# Patient Record
Sex: Female | Born: 1990 | Race: White | Hispanic: No | Marital: Married | Smoking: Never smoker
Health system: Southern US, Community
[De-identification: ages and names within clinical notes are randomized; demographics above are authoritative.]

## PROBLEM LIST (undated history)

## (undated) ENCOUNTER — Inpatient Hospital Stay (HOSPITAL_COMMUNITY): Payer: Self-pay

## (undated) ENCOUNTER — Inpatient Hospital Stay: Payer: Self-pay

## (undated) DIAGNOSIS — C801 Malignant (primary) neoplasm, unspecified: Secondary | ICD-10-CM

## (undated) DIAGNOSIS — O219 Vomiting of pregnancy, unspecified: Secondary | ICD-10-CM

## (undated) DIAGNOSIS — G43909 Migraine, unspecified, not intractable, without status migrainosus: Secondary | ICD-10-CM

## (undated) DIAGNOSIS — Z6711 Type A blood, Rh negative: Secondary | ICD-10-CM

## (undated) DIAGNOSIS — N83209 Unspecified ovarian cyst, unspecified side: Secondary | ICD-10-CM

## (undated) HISTORY — DX: Migraine, unspecified, not intractable, without status migrainosus: G43.909

## (undated) HISTORY — DX: Vomiting of pregnancy, unspecified: O21.9

## (undated) HISTORY — DX: Type A blood, Rh negative: Z67.11

## (undated) HISTORY — PX: LAPAROSCOPIC OVARIAN CYSTECTOMY: SUR786

## (undated) NOTE — *Deleted (*Deleted)
IUD PLACEMENT POST-PROCEDURE INSTRUCTIONS  1. You may take Ibuprofen, Aleve or Tylenol for pain if needed.  Cramping should resolve within in 24 hours.  2. You may have a small amount of spotting.  You should wear a mini pad for the next few days.  3. You may have intercourse after 24 hours.  If you using this for birth control, it is effective immediately.  4. You need to call if you have any pelvic pain, fever, heavy bleeding or foul smelling vaginal discharge.  Irregular bleeding is common the first several months after having an IUD placed. You do not need to call for this reason unless you are concerned.  5. Shower or bathe as normal  6. You should have a follow-up appointment in 4-8 weeks for a re-check to make sure you are not having any problems. 

---
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
## 2010-12-15 ENCOUNTER — Emergency Department: Payer: Self-pay | Admitting: Emergency Medicine

## 2011-03-14 NOTE — Op Note (Signed)
NAMEMaple Ball, Hannah Ball               ACCOUNT NO.:  [id number]   MEDICAL RECORD NO.:  [id number]          PATIENT TYPE:  AMB   LOCATION:  SDS                          FACILITY:  [facility]   PHYSICIAN:  Jon Gills, M.D.  DATE OF BIRTH:  11-23-1990   DATE OF PROCEDURE:  01/24/2008  DATE OF DISCHARGE:                               OPERATIVE REPORT   PREOPERATIVE DIAGNOSIS:  Right upper quadrant abdominal pain.   POSTOPERATIVE DIAGNOSIS:  Right upper quadrant abdominal pain.   OPERATION:  Upper GI endoscopy with biopsy.   SURGEON:  Jon Gills, MD   ASSISTANTS:  None.   DESCRIPTION OF FINDINGS:  Following informed written consent, the  patient was taken to the operating room and placed under general  anesthesia with continuous cardiopulmonary monitoring.  She remained in  the supine position and the Pentax upper GI endoscope was passed by  mouth and advanced without difficulty.  A competent lower esophageal  sphincter was present 40 cm from the incisors.  There was no visual  evidence for esophagitis, gastritis, duodenitis, or peptic ulcer  disease.  A solitary gastric biopsy was negative for Helicobacter by CLO  testing.  Multiple esophageal, gastric, and duodenal biopsies were  histologically normal.  The endoscope was gradually withdrawn, and the  patient was awakened and taken to the recovery room in satisfactory  condition.  She will be released later today to the care of her family.   DESCRIPTION OF TECHNICAL PROCEDURES USED:  Pentax upper GI endoscope  with cold biopsy forceps.   DESCRIPTION OF SPECIMENS REMOVED:  Esophagus x3 in formalin, gastric x1  for CLO testing, gastric x3 in formalin, and duodenum x3 in formalin.           ______________________________  Jon Gills, M.D.     JHC/MEDQ  D:  02/17/2008  T:  02/18/2008  Job:  [id number]   cc:   Vonita Moss, MD

## 2011-07-24 LAB — CLOTEST (H. PYLORI), BIOPSY: Helicobacter screen: NEGATIVE

## 2011-07-24 LAB — CBC
Hemoglobin: 12.3
RDW: 15.1

## 2011-07-24 LAB — DIFFERENTIAL
Basophils Absolute: 0
Lymphocytes Relative: 33
Monocytes Absolute: 0.3
Neutro Abs: 2.9
Neutrophils Relative %: 58

## 2011-10-31 DIAGNOSIS — N83209 Unspecified ovarian cyst, unspecified side: Secondary | ICD-10-CM

## 2012-09-04 ENCOUNTER — Inpatient Hospital Stay: Payer: Self-pay

## 2012-09-04 LAB — CBC WITH DIFFERENTIAL/PLATELET
Basophil #: 0 10*3/uL (ref 0.0–0.1)
Eosinophil #: 0.1 10*3/uL (ref 0.0–0.7)
Eosinophil %: 0.6 %
HGB: 10.6 g/dL — ABNORMAL LOW (ref 12.0–16.0)
Lymphocyte %: 18.1 %
Monocyte %: 6.9 %
Neutrophil %: 74.1 %
RBC: 4.5 10*6/uL (ref 3.80–5.20)

## 2012-09-05 LAB — PLATELET COUNT: Platelet: 143 10*3/uL — ABNORMAL LOW (ref 150–440)

## 2014-03-04 ENCOUNTER — Emergency Department: Payer: Self-pay | Admitting: Emergency Medicine

## 2015-04-24 ENCOUNTER — Emergency Department
Admission: EM | Admit: 2015-04-24 | Discharge: 2015-04-25 | Disposition: A | Discharge status: Home / Self Care | Payer: Medicaid Other | Attending: Emergency Medicine | Admitting: Emergency Medicine

## 2015-04-24 ENCOUNTER — Encounter: Payer: Self-pay | Admitting: Emergency Medicine

## 2015-04-24 DIAGNOSIS — Z79899 Other long term (current) drug therapy: Secondary | ICD-10-CM | POA: Diagnosis not present

## 2015-04-24 DIAGNOSIS — Z880 Allergy status to penicillin: Secondary | ICD-10-CM | POA: Diagnosis not present

## 2015-04-24 DIAGNOSIS — R197 Diarrhea, unspecified: Secondary | ICD-10-CM | POA: Insufficient documentation

## 2015-04-24 DIAGNOSIS — R112 Nausea with vomiting, unspecified: Secondary | ICD-10-CM

## 2015-04-24 DIAGNOSIS — R109 Unspecified abdominal pain: Secondary | ICD-10-CM | POA: Diagnosis present

## 2015-04-24 DIAGNOSIS — Z91040 Latex allergy status: Secondary | ICD-10-CM | POA: Diagnosis not present

## 2015-04-24 DIAGNOSIS — R102 Pelvic and perineal pain: Secondary | ICD-10-CM

## 2015-04-24 HISTORY — DX: Unspecified ovarian cyst, unspecified side: N83.209

## 2015-04-24 LAB — COMPREHENSIVE METABOLIC PANEL
ALK PHOS: 56 U/L (ref 38–126)
ALT: 17 U/L (ref 14–54)
AST: 25 U/L (ref 15–41)
Albumin: 4.3 g/dL (ref 3.5–5.0)
Anion gap: 6 (ref 5–15)
BUN: 13 mg/dL (ref 6–20)
CHLORIDE: 105 mmol/L (ref 101–111)
CO2: 28 mmol/L (ref 22–32)
CREATININE: 0.87 mg/dL (ref 0.44–1.00)
Calcium: 9 mg/dL (ref 8.9–10.3)
GFR calc Af Amer: >60 mL/min (ref >60)
Glucose, Bld: 91 mg/dL (ref 65–99)
Potassium: 3.5 mmol/L (ref 3.5–5.1)
Sodium: 139 mmol/L (ref 135–145)
Total Bilirubin: 1.2 mg/dL (ref 0.3–1.2)
Total Protein: 7.6 g/dL (ref 6.5–8.1)

## 2015-04-24 LAB — CBC WITH DIFFERENTIAL/PLATELET
Basophils Absolute: 0 10*3/uL (ref 0–0.1)
Basophils Relative: 0 %
EOS PCT: 1 %
Eosinophils Absolute: 0.1 10*3/uL (ref 0–0.7)
HEMATOCRIT: 46 % (ref 35.0–47.0)
HEMOGLOBIN: 15 g/dL (ref 12.0–16.0)
LYMPHS PCT: 21 %
Lymphs Abs: 1.8 10*3/uL (ref 1.0–3.6)
MCH: 27 pg (ref 26.0–34.0)
MCHC: 32.6 g/dL (ref 32.0–36.0)
MCV: 82.8 fL (ref 80.0–100.0)
MONOS PCT: 7 %
Monocytes Absolute: 0.6 10*3/uL (ref 0.2–0.9)
NEUTROS PCT: 71 %
Neutro Abs: 6 10*3/uL (ref 1.4–6.5)
PLATELETS: 163 10*3/uL (ref 150–440)
RBC: 5.55 MIL/uL — ABNORMAL HIGH (ref 3.80–5.20)
RDW: 13.8 % (ref 11.5–14.5)
WBC: 8.5 10*3/uL (ref 3.6–11.0)

## 2015-04-24 LAB — WET PREP, GENITAL
TRICH WET PREP: NONE SEEN
Yeast Wet Prep HPF POC: NONE SEEN

## 2015-04-24 LAB — POCT PREGNANCY, URINE: Preg Test, Ur: NEGATIVE

## 2015-04-24 LAB — URINALYSIS COMPLETE WITH MICROSCOPIC (ARMC ONLY)
Bacteria, UA: NONE SEEN
Bilirubin Urine: NEGATIVE
GLUCOSE, UA: NEGATIVE mg/dL
Ketones, ur: NEGATIVE mg/dL
Nitrite: NEGATIVE
Protein, ur: NEGATIVE mg/dL
Specific Gravity, Urine: 1.024 (ref 1.005–1.030)
pH: 6 (ref 5.0–8.0)

## 2015-04-24 MED ORDER — ACETAMINOPHEN 500 MG PO TABS
1000.0000 mg | ORAL_TABLET | Freq: Once | ORAL | Status: AC
  Administered 2015-04-24: 1000 mg via ORAL

## 2015-04-24 MED ORDER — ACETAMINOPHEN 500 MG PO TABS
ORAL_TABLET | ORAL | Status: AC
  Administered 2015-04-24: 1000 mg via ORAL
  Filled 2015-04-24: qty 2

## 2015-04-24 MED ORDER — METRONIDAZOLE 500 MG PO TABS: 500.0000 mg | ORAL_TABLET | Freq: Two times a day (BID) | ORAL | Status: DC

## 2015-04-24 NOTE — ED Provider Notes (Signed)
[facility] Emergency Department Provider Note  ____________________________________________  Time seen: 2135  I have reviewed the triage vital signs and the nursing notes.   HISTORY  Chief Complaint Abdominal Pain  concerns for IUD    HPI Hannah Ball is a 24 y.o. female who had an IUD placed in January 2014. She presents today because she has been having abdominal pain for 1-2 weeks and she has had nausea vomiting diarrhea for the past 2-3 days. She is concerned that it may be caused by the IUD being out of position.   She has been concerned about the IUD for some time as she feels her pattern of vaginal bleeding is not consistent with what she was told what happened with the IUD. She was told she would have some spotting but not likely to have regular menstrual periods. Indeed she has erratic bleeding. Sometimes she'll have a heavier flow then not bleed for 5-7 days and then have some additional bleeding.  She also has a vague description of feeling like IUD is falling out when she is walking.  This [facility]UD [facility] at [facility].    Past Medical History  Diagnosis Date  . Ovarian cyst     There are no active problems to display for this patient.   No past surgical history on file.  Current Outpatient Rx  Name  Route  Sig  Dispense  Refill  . ibuprofen (ADVIL,MOTRIN) 200 MG tablet   Oral   Take 200-400 mg by mouth every 6 (six) hours as needed for headache, mild pain or cramping.         . metroNIDAZOLE (FLAGYL) 500 MG tablet   Oral   Take 1 tablet (500 mg total) by mouth 2 (two) times daily.   14 tablet   0     Allergies Benadryl; Neosporin plus max st; Latex; and Penicillins  No family history on file.  Social History History  Substance Use Topics  . Smoking status: Never Smoker   . Smokeless tobacco: Not on file  . Alcohol Use: Yes    Review of Systems  Constitutional: Negative for fever. ENT: Negative for sore  throat. Cardiovascular: Negative for chest pain. Respiratory: Negative for shortness of breath. Gastrointestinal: Positive for abdominal pain, vomiting and diarrhea. Genitourinary: Concern for her IUD. Musculoskeletal: No myalgias or injuries. Skin: Negative for rash. Neurological: Negative for headaches   10-point ROS otherwise negative.  ____________________________________________   PHYSICAL EXAM:  VITAL SIGNS: ED Triage Vitals  Enc Vitals Group     BP 04/24/15 1806 109/59 mmHg     Pulse Rate 04/24/15 1806 80     Resp 04/24/15 1806 18     Temp 04/24/15 1806 98.2 F (36.8 C)     Temp Source 04/24/15 1806 Oral     SpO2 04/24/15 1806 99 %     Weight 04/24/15 1806 170 lb (77.111 kg)     Height 04/24/15 1806  (1.702 m)     Head Cir --      Peak Flow --      Pain Score 04/24/15 1807 7     Pain Loc --      Pain Edu? --      Excl. in GC? --     Constitutional: Alert and oriented. Well appearing and in no distress. ENT   Head: Normocephalic and atraumatic.   Nose: No congestion/rhinnorhea.   Mouth/Throat: Mucous membranes are moist. Cardiovascular: Normal rate, regular rhythm, no murmur noted  Respiratory:  Normal respiratory effort, no tachypnea.    Breath sounds are clear and equal bilaterally.  Gastrointestinal: Soft and nontender. No distention.  Back: No muscle spasm, no tenderness, no CVA tenderness. Musculoskeletal: No deformity noted. Nontender with normal range of motion in all extremities.  No noted edema. Pelvic: Normal external female genitalia, no cervical motion tenderness nor adnexal tenderness , the cervix is quite posterior and I was not able to directly visualize it. This despite repositioning the patient and making an additional attempt with the speculum.  Neurologic:  Normal speech and language. No gross focal neurologic deficits are appreciated.  Skin:  Skin is warm, dry. No rash noted. Psychiatric: Mood and affect are normal. Speech and  behavior are normal.  ____________________________________________    LABS (pertinent positives/negatives)  Pregnancy negative CBC within normal limits Metabolic panel within normal limits Urinalysis: 6-30 red blood cells, 0 white blood cells, no acute tones 6-30 epithelial cells.,   ____________________________________________  RADIOLOGY  Pelvic ultrasound   ____________________________________________   INITIAL IMPRESSION / ASSESSMENT AND PLAN / ED COURSE  Pertinent labs & imaging results that were available during my care of the patient were reviewed by me and considered in my medical decision making (see chart for details).  Patient with a mix complaint of nausea and vomiting and some diarrhea but also with concerns for the IUD that was placed over 2 years ago. She is concerned it is malpositioned. She describes how it feels like it might be falling out, but her primary concern is that it has moved higher up and is a danger to her.   Regarding nausea vomiting diarrhea, the patient looks well and is hungry and not having any nausea at this time. Her blood tests all look normal and she has no ketones in her urine.  Pelvic exam was limited by lack of access to a pelvic bed. We use a bedpan and when unable to visualize her cervix, we moved her to the end of the bed and tried a different position. On bimanual exam the cervix was quite posterior. In over to further assess the position of the IUD, we will get an ultrasound.  I have low suspicion for malposition of the IUD. If it is within the uterus we will have her follow-up with [facility] for ongoing care.  The patient does report that she has bacterial infections frequently. With clarification she means bacterial vaginosis. She is asking for medication for this. We will prescribe metronidazole.  At this hour I will be transferring my patients to Dr. York Cerise. Dr. York Cerise will follow-up on the ultrasound  results.  ____________________________________________   FINAL CLINICAL IMPRESSION(S) / ED DIAGNOSES  Final diagnoses:  Pelvic pain in female  Nausea vomiting and diarrhea      Darien Ramus, MD 04/24/15 2324

## 2015-04-24 NOTE — ED Notes (Signed)
RN called lab, stated they would check to see if they had what was needed for HIV test.

## 2015-04-24 NOTE — ED Notes (Signed)
Pt requesting pain meds, MD aware

## 2015-04-24 NOTE — ED Notes (Addendum)
C/o lower abd. Cramping for a couple of weeks, states pain is getting worse, states "I think my IUD has shifted", states she also has some pain with urination and "when I walk I feel like it is falling out", also c/o n,v,d for 2 days

## 2015-04-24 NOTE — Discharge Instructions (Signed)
Your blood tests all look good. Your urine did not show sign of infection. Follow-up with [facility] for ongoing concerns about your IUD. Return to the emergency department if you have nausea and vomiting and cannot tolerate fluids by mouth or if you have other urgent concerns.

## 2015-04-25 ENCOUNTER — Emergency Department: Payer: Medicaid Other

## 2015-04-25 LAB — CHLAMYDIA/NGC RT PCR (ARMC ONLY)
Chlamydia Tr: NOT DETECTED
N gonorrhoeae: NOT DETECTED

## 2015-04-25 NOTE — ED Notes (Signed)
Pt to US.

## 2015-04-25 NOTE — ED Provider Notes (Signed)
-----------------------------------------   12:00 AM on 04/25/2015 -----------------------------------------  Assuming care from Dr. Carollee Massed.  In short, Hannah Ball is a 24 y.o. female with a chief complaint of Abdominal Pain .  She is worried about the placement of her IUD.  Refer to the original H&P for additional details.  The current plan of care is to follow up the ultrasound reports and reassess.  ----------------------------------------- 3:01 AM on 04/25/2015 -----------------------------------------  I personally discussed the results of the ultrasound report with Dr. Particia Lather by phone as part of my medical decision making.  The finding is completely normal and the IUD appears appropriately placed.  The patient has been sleeping comfortably and is in no distress.  I updated her and we will discharge her as per Dr. Leata Mouse plan.  She agrees with this plan.  US Transvaginal Non-ob  04/25/2015   CLINICAL DATA:  Abdominal pain and cramping for 2 weeks, ovarian cyst.  EXAM: TRANSABDOMINAL AND TRANSVAGINAL ULTRASOUND OF PELVIS  TECHNIQUE: Both transabdominal and transvaginal ultrasound examinations of the pelvis were performed. Transabdominal technique was performed for global imaging of the pelvis including uterus, ovaries, adnexal regions, and pelvic cul-de-sac. It was necessary to proceed with endovaginal exam following the transabdominal exam to visualize the endometrium and adnexal .  COMPARISON:  Pelvic ultrasound January 29, 2008  FINDINGS: Uterus  Measurements: 7.1 x 3 x 5.4 cm. No fibroids or other mass visualized.  Endometrium  Thickness: 3.1 mm.  Central IUD  Right ovary  Measurements: 3.3 x 2.5 x 2.8 cm. Normal appearance/no adnexal mass.  Left ovary  Measurements: 3.4 x 1.9 x 2.2 cm. Normal appearance/no adnexal mass.  Other findings  No free fluid.  IMPRESSION: IUD in central endometrium. Otherwise unremarkable pelvic ultrasound.   Electronically Signed   By: Awilda Metro M.D.   On: 04/25/2015 02:47   US Pelvis Complete  04/25/2015   CLINICAL DATA:  Abdominal pain and cramping for 2 weeks, ovarian cyst.  EXAM: TRANSABDOMINAL AND TRANSVAGINAL ULTRASOUND OF PELVIS  TECHNIQUE: Both transabdominal and transvaginal ultrasound examinations of the pelvis were performed. Transabdominal technique was performed for global imaging of the pelvis including uterus, ovaries, adnexal regions, and pelvic cul-de-sac. It was necessary to proceed with endovaginal exam following the transabdominal exam to visualize the endometrium and adnexal .  COMPARISON:  Pelvic ultrasound January 29, 2008  FINDINGS: Uterus  Measurements: 7.1 x 3 x 5.4 cm. No fibroids or other mass visualized.  Endometrium  Thickness: 3.1 mm.  Central IUD  Right ovary  Measurements: 3.3 x 2.5 x 2.8 cm. Normal appearance/no adnexal mass.  Left ovary  Measurements: 3.4 x 1.9 x 2.2 cm. Normal appearance/no adnexal mass.  Other findings  No free fluid.  IMPRESSION: IUD in central endometrium. Otherwise unremarkable pelvic ultrasound.   Electronically Signed   By: Awilda Metro M.D.   On: 04/25/2015 02:47     Loleta Rose, MD 04/25/15 [phone]

## 2015-04-25 NOTE — ED Notes (Signed)
Pt alert and in NAD at time of d/c to mother. 

## 2015-04-25 NOTE — ED Notes (Signed)
Pt sleeping soundly at this time. RN called [place] to obtain update on wait time. Family explained what the delay in care is at this time.

## 2015-12-29 ENCOUNTER — Encounter: Payer: Self-pay | Admitting: Emergency Medicine

## 2015-12-29 ENCOUNTER — Emergency Department
Admission: EM | Admit: 2015-12-29 | Discharge: 2015-12-29 | Disposition: A | Discharge status: Home / Self Care | Payer: Medicaid Other | Attending: Emergency Medicine | Admitting: Emergency Medicine

## 2015-12-29 DIAGNOSIS — J020 Streptococcal pharyngitis: Secondary | ICD-10-CM | POA: Insufficient documentation

## 2015-12-29 DIAGNOSIS — Z91040 Latex allergy status: Secondary | ICD-10-CM | POA: Diagnosis not present

## 2015-12-29 DIAGNOSIS — J039 Acute tonsillitis, unspecified: Secondary | ICD-10-CM

## 2015-12-29 DIAGNOSIS — Z792 Long term (current) use of antibiotics: Secondary | ICD-10-CM | POA: Insufficient documentation

## 2015-12-29 DIAGNOSIS — J029 Acute pharyngitis, unspecified: Secondary | ICD-10-CM | POA: Diagnosis present

## 2015-12-29 DIAGNOSIS — Z880 Allergy status to penicillin: Secondary | ICD-10-CM | POA: Diagnosis not present

## 2015-12-29 MED ORDER — ACETAMINOPHEN 325 MG PO TABS
ORAL_TABLET | ORAL | Status: AC
  Filled 2015-12-29: qty 2

## 2015-12-29 MED ORDER — ACETAMINOPHEN 325 MG PO TABS
ORAL_TABLET | ORAL | Status: AC
  Filled 2015-12-29: qty 1

## 2015-12-29 MED ORDER — AZITHROMYCIN 250 MG PO TABS: ORAL_TABLET | ORAL | Status: DC

## 2015-12-29 MED ORDER — ACETAMINOPHEN 325 MG PO TABS: 650.0000 mg | ORAL_TABLET | Freq: Once | ORAL | Status: DC | PRN

## 2015-12-29 MED ORDER — ACETAMINOPHEN-CODEINE #3 300-30 MG PO TABS: 1.0000 | ORAL_TABLET | Freq: Four times a day (QID) | ORAL | Status: DC | PRN

## 2015-12-29 NOTE — ED Notes (Signed)
Pt reports Monday started with weakness, sore throat, fever and chills. Pt reports hurts to swallow. Pt reports her son recently dx'd with strep

## 2015-12-29 NOTE — ED Provider Notes (Signed)
[facility] Emergency Department Provider Note ____________________________________________  Time seen: 1255  I have reviewed the triage vital signs and the nursing notes.  HISTORY  Chief Complaint  Sore Throat; Fever; and URI  HPI Hannah Ball is a 25 y.o. female percents to the ED for evaluation of sudden onset of sore throat pain, fevers, and nausea. She describes the onset Monday, 2 days prior to arrival, and after exposure to her son who was just diagnosed with strep tonsillitis. She reports a Tmax of 10 5F yesterday. She is dosed Tylenol any interim for pain relief. She reports bilateral throat pain with swallowing and significant swelling to the right side of the neck. Torso discomfort at 9/10 in triage.  Past Medical History  Diagnosis Date  . Ovarian cyst     There are no active problems to display for this patient.   History reviewed. No pertinent past surgical history.  Current Outpatient Rx  Name  Route  Sig  Dispense  Refill  . acetaminophen-codeine (TYLENOL #3) 300-30 MG tablet   Oral   Take 1 tablet by mouth every 6 (six) hours as needed for moderate pain.   10 tablet   0   . azithromycin (ZITHROMAX Z-PAK) 250 MG tablet      Take 2 tablets (500 mg) on  Day 1,  followed by 1 tablet (250 mg) once daily on Days 2 through 5.   6 each   0   . ibuprofen (ADVIL,MOTRIN) 200 MG tablet   Oral   Take 200-400 mg by mouth every 6 (six) hours as needed for headache, mild pain or cramping.         . metroNIDAZOLE (FLAGYL) 500 MG tablet   Oral   Take 1 tablet (500 mg total) by mouth 2 (two) times daily.   14 tablet   0    Allergies Benadryl; Neosporin plus max st; Latex; and Penicillins  No family history on file.  Social History Social History  Substance Use Topics  . Smoking status: Never Smoker   . Smokeless tobacco: None  . Alcohol Use: Yes   Review of Systems  Constitutional: Positive for fever. Eyes: Negative for visual  changes. ENT: Positive for sore throat. Cardiovascular: Negative for chest pain. Respiratory: Negative for shortness of breath. Gastrointestinal: Negative for abdominal pain, vomiting and diarrhea. Genitourinary: Negative for dysuria. Musculoskeletal: Negative for back pain. Skin: Negative for rash. Neurological: Negative for headaches, focal weakness or numbness. ____________________________________________  PHYSICAL EXAM:  VITAL SIGNS: ED Triage Vitals  Enc Vitals Group     BP 12/29/15 1005 130/78 mmHg     Pulse Rate 12/29/15 1005 117     Resp 12/29/15 1005 20     Temp 12/29/15 1005 100.5 F (38.1 C)     Temp Source 12/29/15 1005 Oral     SpO2 12/29/15 1005 97 %     Weight 12/29/15 1005 162 lb (73.483 kg)     Height 12/29/15 1005  (1.727 m)     Head Cir --      Peak Flow --      Pain Score 12/29/15 1005 9     Pain Loc --      Pain Edu? --      Excl. in GC? --    Constitutional: Alert and oriented. Well appearing and in no distress. Head: Normocephalic and atraumatic.      Eyes: Conjunctivae are normal. PERRL. Normal extraocular movements      Ears:  Canals clear. TMs intact bilaterally.   Nose: No congestion/rhinorrhea.   Mouth/Throat: Mucous membranes are moist. Uvula is midline and tonsils are enlarged, edematous, erythematous, and exudative.   Neck: Supple. No thyromegaly. Hematological/Lymphatic/Immunological: Palpable, tender anterior cervical lymphadenopathy. Cardiovascular: Normal rate, regular rhythm.  Respiratory: Normal respiratory effort. No wheezes/rales/rhonchi. Gastrointestinal: Soft and nontender. No distention. Musculoskeletal: Nontender with normal range of motion in all extremities.  Neurologic:  Normal gait without ataxia. Normal speech and language. No gross focal neurologic deficits are appreciated. Skin:  Skin is warm, dry and intact. No rash noted. Psychiatric: Mood and affect are normal. Patient exhibits appropriate insight and  judgment. ____________________________________________   LABS (pertinent positives/negatives) Labs Reviewed  CULTURE, GROUP A STREP [facility])   Rapid Strep - Negative ____________________________________________  PROCEDURES  Tylenol 650 mg PO ____________________________________________  INITIAL IMPRESSION / ASSESSMENT AND PLAN / ED COURSE  Patient with a clinical presentation consistent with acute, exudative tonsillitis likely due to strep infection. Despite the negative rapid strep result patient will be treated with azithromycin given her confirmed penicillin allergy. She is encouraged to continue to monitor and treat fevers as appropriate. She was discharged with a productive Tylenol with codeine as well for acute pain relief. She will follow-up with primary provider or return to the ED for acutely worsening symptoms. ____________________________________________  FINAL CLINICAL IMPRESSION(S) / ED DIAGNOSES  Final diagnoses:  Tonsillitis with exudate  Strep throat      Lissa Hoard, PA-C 12/29/15 1922  Jeanmarie Plant, MD 12/30/15 1126

## 2015-12-29 NOTE — Discharge Instructions (Signed)
Pharyngitis Pharyngitis is redness, pain, and swelling (inflammation) of your pharynx.  CAUSES  Pharyngitis is usually caused by infection. Most of the time, these infections are from viruses (viral) and are part of a cold. However, sometimes pharyngitis is caused by bacteria (bacterial). Pharyngitis can also be caused by allergies. Viral pharyngitis may be spread from person to person by coughing, sneezing, and personal items or utensils (cups, forks, spoons, toothbrushes). Bacterial pharyngitis may be spread from person to person by more intimate contact, such as kissing.  SIGNS AND SYMPTOMS  Symptoms of pharyngitis include:   Sore throat.   Tiredness (fatigue).   Low-grade fever.   Headache.  Joint pain and muscle aches.  Skin rashes.  Swollen lymph nodes.  Plaque-like film on throat or tonsils (often seen with bacterial pharyngitis). DIAGNOSIS  Your health care provider will ask you questions about your illness and your symptoms. Your medical history, along with a physical exam, is often all that is needed to diagnose pharyngitis. Sometimes, a rapid strep test is done. Other lab tests may also be done, depending on the suspected cause.  TREATMENT  Viral pharyngitis will usually get better in 3-4 days without the use of medicine. Bacterial pharyngitis is treated with medicines that kill germs (antibiotics).  HOME CARE INSTRUCTIONS   Drink enough water and fluids to keep your urine clear or pale yellow.   Only take over-the-counter or prescription medicines as directed by your health care provider:   If you are prescribed antibiotics, make sure you finish them even if you start to feel better.   Do not take aspirin.   Get lots of rest.   Gargle with 8 oz of salt water ( tsp of salt per 1 qt of water) as often as every 1-2 hours to soothe your throat.   Throat lozenges (if you are not at risk for choking) or sprays may be used to soothe your throat. SEEK MEDICAL  CARE IF:   You have large, tender lumps in your neck.  You have a rash.  You cough up green, yellow-brown, or bloody spit. SEEK IMMEDIATE MEDICAL CARE IF:   Your neck becomes stiff.  You drool or are unable to swallow liquids.  You vomit or are unable to keep medicines or liquids down.  You have severe pain that does not go away with the use of recommended medicines.  You have trouble breathing (not caused by a stuffy nose). MAKE SURE YOU:   Understand these instructions.  Will watch your condition.  Will get help right away if you are not doing well or get worse.   This information is not intended to replace advice given to you by your health care provider. Make sure you discuss any questions you have with your health care provider.   Document Released: 10/16/2005 Document Revised: 08/06/2013 Document Reviewed: 06/23/2013 Elsevier Interactive Patient Education 2016 [employer]   Strep Throat Strep throat is an infection of the throat. It is caused by germs. Strep throat spreads from person to person because of coughing, sneezing, or close contact. HOME CARE Medicines  Take over-the-counter and prescription medicines only as told by your doctor.  Take your antibiotic medicine as told by your doctor. Do not stop taking the medicine even if you feel better.  Have family members who also have a sore throat or fever go to a doctor. Eating and Drinking  Do not share food, drinking cups, or personal items.  Try eating soft foods until your sore throat  feels better.  Drink enough fluid to keep your pee (urine) clear or pale yellow. General Instructions  Rinse your mouth (gargle) with a salt-water mixture 3-4 times per day or as needed. To make a salt-water mixture, stir -1 tsp of salt into 1 cup of warm water.  Make sure that all people in your house wash their hands well.  Rest.  Stay home from school or work until you have been taking antibiotics for 24  hours.  Keep all follow-up visits as told by your doctor. This is important. GET HELP IF:  Your neck keeps getting bigger.  You get a rash, cough, or earache.  You cough up thick liquid that is green, yellow-brown, or bloody.  You have pain that does not get better with medicine.  Your problems get worse instead of getting better.  You have a fever. GET HELP RIGHT AWAY IF:  You throw up (vomit).  You get a very bad headache.  You neck hurts or it feels stiff.  You have chest pain or you are short of breath.  You have drooling, very bad throat pain, or changes in your voice.  Your neck is swollen or the skin gets red and tender.  Your mouth is dry or you are peeing less than normal.  You keep feeling more tired or it is hard to wake up.  Your joints are red or they hurt.   This information is not intended to replace advice given to you by your health care provider. Make sure you discuss any questions you have with your health care provider.   Document Released: 04/03/2008 Document Revised: 07/07/2015 Document Reviewed: 02/08/2015 Elsevier Interactive Patient Education 2016 [employer].  You are being treated for a presumed strep throat. You should take the antibiotic as directed, until completed. Follow-up with your provider or [employer] as needed.

## 2015-12-30 LAB — CULTURE, GROUP A STREP (THRC)

## 2016-05-23 ENCOUNTER — Encounter: Payer: Self-pay | Admitting: Emergency Medicine

## 2016-05-23 ENCOUNTER — Emergency Department
Admission: EM | Admit: 2016-05-23 | Discharge: 2016-05-23 | Disposition: A | Discharge status: Home / Self Care | Payer: Medicaid Other | Attending: Emergency Medicine | Admitting: Emergency Medicine

## 2016-05-23 DIAGNOSIS — Z792 Long term (current) use of antibiotics: Secondary | ICD-10-CM | POA: Insufficient documentation

## 2016-05-23 DIAGNOSIS — M545 Low back pain, unspecified: Secondary | ICD-10-CM

## 2016-05-23 DIAGNOSIS — N9489 Other specified conditions associated with female genital organs and menstrual cycle: Secondary | ICD-10-CM | POA: Insufficient documentation

## 2016-05-23 DIAGNOSIS — Z791 Long term (current) use of non-steroidal anti-inflammatories (NSAID): Secondary | ICD-10-CM | POA: Insufficient documentation

## 2016-05-23 DIAGNOSIS — Z79899 Other long term (current) drug therapy: Secondary | ICD-10-CM | POA: Insufficient documentation

## 2016-05-23 LAB — CBC
HEMATOCRIT: 49.3 % — AB (ref 35.0–47.0)
Hemoglobin: 16.8 g/dL — ABNORMAL HIGH (ref 12.0–16.0)
MCH: 28.4 pg (ref 26.0–34.0)
MCHC: 34.1 g/dL (ref 32.0–36.0)
MCV: 83.5 fL (ref 80.0–100.0)
PLATELETS: 128 10*3/uL — AB (ref 150–440)
RBC: 5.9 MIL/uL — ABNORMAL HIGH (ref 3.80–5.20)
RDW: 14.1 % (ref 11.5–14.5)
WBC: 5.9 10*3/uL (ref 3.6–11.0)

## 2016-05-23 LAB — URINALYSIS COMPLETE WITH MICROSCOPIC (ARMC ONLY)
BILIRUBIN URINE: NEGATIVE
GLUCOSE, UA: NEGATIVE mg/dL
Ketones, ur: NEGATIVE mg/dL
Nitrite: NEGATIVE
Protein, ur: 30 mg/dL — AB
Specific Gravity, Urine: 1.019 (ref 1.005–1.030)
pH: 7 (ref 5.0–8.0)

## 2016-05-23 LAB — COMPREHENSIVE METABOLIC PANEL
ALBUMIN: 4.6 g/dL (ref 3.5–5.0)
ALT: 49 U/L (ref 14–54)
AST: 33 U/L (ref 15–41)
Alkaline Phosphatase: 48 U/L (ref 38–126)
Anion gap: 10 (ref 5–15)
BUN: 8 mg/dL (ref 6–20)
CHLORIDE: 107 mmol/L (ref 101–111)
CO2: 20 mmol/L — AB (ref 22–32)
CREATININE: 0.69 mg/dL (ref 0.44–1.00)
Calcium: 9.4 mg/dL (ref 8.9–10.3)
GFR calc Af Amer: >60 mL/min (ref >60)
GLUCOSE: 90 mg/dL (ref 65–99)
POTASSIUM: 3.9 mmol/L (ref 3.5–5.1)
Sodium: 137 mmol/L (ref 135–145)
Total Bilirubin: 1.8 mg/dL — ABNORMAL HIGH (ref 0.3–1.2)
Total Protein: 7.7 g/dL (ref 6.5–8.1)

## 2016-05-23 LAB — HCG, QUANTITATIVE, PREGNANCY

## 2016-05-23 LAB — LIPASE, BLOOD: LIPASE: 20 U/L (ref 11–51)

## 2016-05-23 MED ORDER — CYCLOBENZAPRINE HCL 5 MG PO TABS: 5.0000 mg | ORAL_TABLET | Freq: Three times a day (TID) | ORAL | 0 refills | Status: DC | PRN

## 2016-05-23 NOTE — Discharge Instructions (Signed)
Please seek medical attention for any high fevers, chest pain, shortness of breath, change in behavior, persistent vomiting, bloody stool or any other new or concerning symptoms.  

## 2016-05-23 NOTE — ED Provider Notes (Signed)
[facility] Emergency Department Provider Note    ____________________________________________   I have reviewed the triage vital signs and the nursing notes.   HISTORY  Chief Complaint Abdominal Pain   History limited by: Not Limited   HPI Hannah Ball is a 25 y.o. female who presents to the emergency department today because of concern for lower abdominal pain and back pain. The abdominal pain has been going on for 2 weeks. Located in the center of her lower abdomen. Has not been accompanied by any dysuria or bad odor to her urine. No change in defecation. The patient does state that she took multiple home pregnancy tests with some reading yes and some no. In addition the patient is complaining of low back pain. Primary right low back pain. Did try ibuprofen without great relief.    Past Medical History:  Diagnosis Date  . Ovarian cyst     There are no active problems to display for this patient.   No past surgical history on file.  Current Outpatient Rx  . Order #: [id number] Class: Print  . Order #: [id number] Class: Print  . Order #: [id number] Class: Historical Med  . Order #: [id number] Class: Print    Allergies Benadryl [diphenhydramine]; Neosporin plus max st; Latex; and Penicillins  No family history on file.  Social History Social History  Substance Use Topics  . Smoking status: Never Smoker  . Smokeless tobacco: Not on file  . Alcohol use Yes    Review of Systems  Constitutional: Negative for fever. Cardiovascular: Negative for chest pain. Respiratory: Negative for shortness of breath. Gastrointestinal: Positive for lower abdominal pain. Neurological: Negative for headaches, focal weakness or numbness.  10-point ROS otherwise negative.  ____________________________________________   PHYSICAL EXAM:  VITAL SIGNS: ED Triage Vitals  Enc Vitals Group     BP 05/23/16 1301 123/72     Pulse Rate 05/23/16 1301 86     Resp  05/23/16 1301 16     Temp 05/23/16 1301 98.7 F (37.1 C)     Temp Source 05/23/16 1301 Oral     SpO2 05/23/16 1301 98 %     Weight 05/23/16 1301 160 lb (72.6 kg)     Height 05/23/16 1301  (1.702 m)     Head Circumference --      Peak Flow --      Pain Score 05/23/16 1302 10     Pain Loc --    Constitutional: Alert and oriented. Well appearing and in no distress. Eyes: Conjunctivae are normal. PERRL. Normal extraocular movements. ENT   Head: Normocephalic and atraumatic.   Nose: No congestion/rhinnorhea.   Mouth/Throat: Mucous membranes are moist.   Neck: No stridor. Hematological/Lymphatic/Immunilogical: No cervical lymphadenopathy. Cardiovascular: Normal rate, regular rhythm.  No murmurs, rubs, or gallops. Respiratory: Normal respiratory effort without tachypnea nor retractions. Breath sounds are clear and equal bilaterally. No wheezes/rales/rhonchi. Gastrointestinal: Soft and nontender. No distention. Genitourinary: Deferred Musculoskeletal: Normal range of motion in all extremities. No joint effusions.  No lower extremity tenderness nor edema. Mild tenderness to the right lower paraspinal muscles.  Neurologic:  Normal speech and language. No gross focal neurologic deficits are appreciated.  Skin:  Skin is warm, dry and intact. No rash noted. Psychiatric: Mood and affect are normal. Speech and behavior are normal. Patient exhibits appropriate insight and judgment.  ____________________________________________    LABS (pertinent positives/negatives)  Labs Reviewed  COMPREHENSIVE METABOLIC PANEL - Abnormal; Notable for the following:       Result  Value   CO2 20 (*)    Total Bilirubin 1.8 (*)    All other components within normal limits  CBC - Abnormal; Notable for the following:    RBC 5.90 (*)    Hemoglobin 16.8 (*)    HCT 49.3 (*)    Platelets 128 (*)    All other components within normal limits  URINALYSIS COMPLETEWITH MICROSCOPIC ([facility] ONLY) -  Abnormal; Notable for the following:    Color, Urine YELLOW (*)    APPearance HAZY (*)    Hgb urine dipstick 2+ (*)    Protein, ur 30 (*)    Leukocytes, UA TRACE (*)    Bacteria, UA RARE (*)    Squamous Epithelial / LPF 6-30 (*)    All other components within normal limits  LIPASE, BLOOD  HCG, QUANTITATIVE, PREGNANCY     ____________________________________________   EKG  None  ____________________________________________    RADIOLOGY  None  ____________________________________________   PROCEDURES  Procedures  ____________________________________________   INITIAL IMPRESSION / ASSESSMENT AND PLAN / ED COURSE  Pertinent labs & imaging results that were available during my care of the patient were reviewed by me and considered in my medical decision making (see chart for details).  Patient presented to the emergency department today because of concern for lower back pain and abdominal pain as well as concern that she might be pregnant. Serum bhcg negative. Doubt pregnancy. Patient has some tenderness to right lower paraspinal muscles. Will give muscle relaxant. Did discuss sedating effects. Abdominal exam is benign. No leukocytosis or fever to suggest intraabdominal infection at this time. Will defer abdominal imaging.  ____________________________________________   FINAL CLINICAL IMPRESSION(S) / ED DIAGNOSES  Final diagnoses:  Right-sided low back pain without sciatica     Note: This dictation was prepared with Dragon dictation. Any transcriptional errors that result from this process are unintentional    Phineas Semen, MD 05/23/16 [phone]

## 2016-05-23 NOTE — ED Notes (Addendum)
Pt reports having lower abdominal pain and lower back pain.  Pt reports having 3 positive pregnancy tests, but the most recent 4th pregnancy test was negative, which was taken Sunday.  Pt reports IUD taken out in April and started on Lo-Estrin birth control pills.  Pt reports having a normal period in April and a normal period in May.  Then only 1 day of spotting in June.  No period this past month either.

## 2016-05-23 NOTE — ED Notes (Signed)
Pt discharged home after verbalizing understanding of discharge instructions; nad noted. 

## 2016-05-23 NOTE — ED Triage Notes (Signed)
Pt reports lower stomach pain and lower back pain, pt reports she could possibly be pregnant. Pt reports taking 4 home tests, 3 positive and 4 negative. Pt reports vomiting x3 weeks and frequent urination.

## 2016-06-02 ENCOUNTER — Emergency Department: Payer: Self-pay

## 2016-06-02 ENCOUNTER — Emergency Department
Admission: EM | Admit: 2016-06-02 | Discharge: 2016-06-02 | Disposition: A | Discharge status: Home / Self Care | Payer: Self-pay | Attending: Emergency Medicine | Admitting: Emergency Medicine

## 2016-06-02 ENCOUNTER — Encounter: Payer: Self-pay | Admitting: Emergency Medicine

## 2016-06-02 DIAGNOSIS — Z791 Long term (current) use of non-steroidal anti-inflammatories (NSAID): Secondary | ICD-10-CM | POA: Insufficient documentation

## 2016-06-02 DIAGNOSIS — N390 Urinary tract infection, site not specified: Secondary | ICD-10-CM | POA: Insufficient documentation

## 2016-06-02 DIAGNOSIS — N9489 Other specified conditions associated with female genital organs and menstrual cycle: Secondary | ICD-10-CM | POA: Insufficient documentation

## 2016-06-02 DIAGNOSIS — N939 Abnormal uterine and vaginal bleeding, unspecified: Secondary | ICD-10-CM

## 2016-06-02 LAB — BASIC METABOLIC PANEL
Anion gap: 7 (ref 5–15)
BUN: 7 mg/dL (ref 6–20)
CALCIUM: 9.4 mg/dL (ref 8.9–10.3)
CHLORIDE: 106 mmol/L (ref 101–111)
CO2: 26 mmol/L (ref 22–32)
Creatinine, Ser: 0.78 mg/dL (ref 0.44–1.00)
GFR calc Af Amer: >60 mL/min (ref >60)
GLUCOSE: 83 mg/dL (ref 65–99)
Potassium: 3.6 mmol/L (ref 3.5–5.1)
Sodium: 139 mmol/L (ref 135–145)

## 2016-06-02 LAB — URINALYSIS COMPLETE WITH MICROSCOPIC (ARMC ONLY)
BILIRUBIN URINE: NEGATIVE
GLUCOSE, UA: NEGATIVE mg/dL
KETONES UR: NEGATIVE mg/dL
Nitrite: NEGATIVE
PH: 6 (ref 5.0–8.0)
Protein, ur: 30 mg/dL — AB
Specific Gravity, Urine: 1.021 (ref 1.005–1.030)

## 2016-06-02 LAB — WET PREP, GENITAL
Clue Cells Wet Prep HPF POC: NONE SEEN
SPERM: NONE SEEN
TRICH WET PREP: NONE SEEN
WBC, Wet Prep HPF POC: NONE SEEN
YEAST WET PREP: NONE SEEN

## 2016-06-02 LAB — CHLAMYDIA/NGC RT PCR (ARMC ONLY)
CHLAMYDIA TR: NOT DETECTED
N GONORRHOEAE: NOT DETECTED

## 2016-06-02 LAB — HCG, QUANTITATIVE, PREGNANCY: HCG, BETA CHAIN, QUANT, S: 1 m[IU]/mL (ref <5)

## 2016-06-02 LAB — CBC
HEMATOCRIT: 48.4 % — AB (ref 35.0–47.0)
HEMOGLOBIN: 16.8 g/dL — AB (ref 12.0–16.0)
MCH: 28.8 pg (ref 26.0–34.0)
MCHC: 34.6 g/dL (ref 32.0–36.0)
MCV: 83.2 fL (ref 80.0–100.0)
Platelets: 120 10*3/uL — ABNORMAL LOW (ref 150–440)
RBC: 5.81 MIL/uL — ABNORMAL HIGH (ref 3.80–5.20)
RDW: 13.6 % (ref 11.5–14.5)
WBC: 6.5 10*3/uL (ref 3.6–11.0)

## 2016-06-02 LAB — ABO/RH: ABO/RH(D): A NEG

## 2016-06-02 MED ORDER — NITROFURANTOIN MONOHYD MACRO 100 MG PO CAPS: 100.0000 mg | ORAL_CAPSULE | Freq: Two times a day (BID) | ORAL | 0 refills | Status: AC

## 2016-06-02 NOTE — ED Provider Notes (Signed)
[facility] Emergency Department Provider Note  Time seen: 9:05 AM  I have reviewed the triage vital signs and the nursing notes.   HISTORY  Chief Complaint Abdominal Pain and Vaginal Bleeding    HPI Hannah Ball is a 25 y.o. female with no past medical history who presents to the emergency department for lower abdominal discomfort, back discomfort the past 2-3 weeks, and now with vaginal bleeding. According to the patient she was seen in the emergency department approximately 10 days ago for similar complaint. At that time she had a negative pregnancy test. She states since that visit she has taken a pregnancy test at home some of which have been positive and some were negative, she woke this morning with vaginal bleeding so she came to the emergency department to be evaluated. Describes the lower abdominal or back pain as mild cramping like pain. Denies any dysuria, hematuria, nausea, vomiting, diarrhea or fever. Patient states her main concern is making sure she is not pregnant.  Past Medical History:  Diagnosis Date  . Ovarian cyst     There are no active problems to display for this patient.   History reviewed. No pertinent surgical history.  Prior to Admission medications   Medication Sig Start Date End Date Taking? Authorizing Provider  acetaminophen-codeine (TYLENOL #3) 300-30 MG tablet Take 1 tablet by mouth every 6 (six) hours as needed for moderate pain. 12/29/15   Jenise V Bacon Menshew, PA-C  azithromycin (ZITHROMAX Z-PAK) 250 MG tablet Take 2 tablets (500 mg) on  Day 1,  followed by 1 tablet (250 mg) once daily on Days 2 through 5. 12/29/15   Jenise V Bacon Menshew, PA-C  cyclobenzaprine (FLEXERIL) 5 MG tablet Take 1 tablet (5 mg total) by mouth 3 (three) times daily as needed for muscle spasms. 05/23/16   Phineas Semen, MD  ibuprofen (ADVIL,MOTRIN) 200 MG tablet Take 200-400 mg by mouth every 6 (six) hours as needed for headache, mild pain or  cramping.    Historical Provider, MD  metroNIDAZOLE (FLAGYL) 500 MG tablet Take 1 tablet (500 mg total) by mouth 2 (two) times daily. 04/24/15   Darien Ramus, MD    Allergies  Allergen Reactions  . Benadryl [Diphenhydramine] Hives  . Neosporin Plus Max St Other (See Comments)    Skin burns  . Latex Rash  . Penicillins Rash    No family history on file.  Social History Social History  Substance Use Topics  . Smoking status: Never Smoker  . Smokeless tobacco: Never Used  . Alcohol use Yes    Review of Systems Constitutional: Negative for fever Cardiovascular: Negative for chest pain. Respiratory: Negative for shortness of breath. Gastrointestinal: Negative for abdominal pain Genitourinary: Negative for dysuria. Musculoskeletal: Negative for back pain. Neurological: Negative for headaches, focal weakness or numbness. 10-point ROS otherwise negative.  ____________________________________________   PHYSICAL EXAM:  VITAL SIGNS: ED Triage Vitals  Enc Vitals Group     BP 06/02/16 0852 111/67     Pulse Rate 06/02/16 0852 82     Resp 06/02/16 0852 18     Temp 06/02/16 0852 97.6 F (36.4 C)     Temp Source 06/02/16 0852 Oral     SpO2 06/02/16 0852 100 %     Weight 06/02/16 0852 165 lb (74.8 kg)     Height 06/02/16 0852  (1.727 m)     Head Circumference --      Peak Flow --  Pain Score 06/02/16 0841 10     Pain Loc --      Pain Edu? --      Excl. in GC? --     Constitutional: Alert and oriented. Well appearing and in no distress. Eyes: Normal exam ENT   Head: Normocephalic and atraumatic.   Mouth/Throat: Mucous membranes are moist. Cardiovascular: Normal rate, regular rhythm. No murmur Respiratory: Normal respiratory effort without tachypnea nor retractions. Breath sounds are clear Gastrointestinal: Soft, mild suprapubic tenderness palpation, no rebound or guarding. No distention. Musculoskeletal: Nontender with normal range of motion in all  extremities.  Neurologic:  Normal speech and language. No gross focal neurologic deficits are appreciated. Skin:  Skin is warm, dry and intact.  Psychiatric: Mood and affect are normal. Speech and behavior are normal.   ____________________________________________   RADIOLOGY  Ultrasound normal.  ____________________________________________   INITIAL IMPRESSION / ASSESSMENT AND PLAN / ED COURSE  Pertinent labs & imaging results that were available during my care of the patient were reviewed by me and considered in my medical decision making (see chart for details).  Patient presents emergency department mild lower abdominal and lower back cramping for the past 2-3 weeks. Patient states positive and negative pregnancy test at home. We will check labs including a quantitative beta hCG, perform a pelvic examination as well as an ultrasound.  Pelvic exam shows a mild amount of vaginal bleeding. No adnexal tenderness. No discernible discharge. Closed cervix.  Beta hCG level resulted at 1. Ultrasound shows a normal appearing endometrium and pelvic ultrasound. We will discharge the patient home with OB/GYN follow-up.  ____________________________________________   FINAL CLINICAL IMPRESSION(S) / ED DIAGNOSES  Lower abdominal pain Vaginal bleeding Urinary tract infection    Minna Antis, MD 06/02/16 1224

## 2016-06-02 NOTE — ED Triage Notes (Signed)
Pt presents with lower abd pain and vaginal bleeding started this am. Pt states she has taken several pregnancy tests and all have been positive.

## 2016-09-05 ENCOUNTER — Encounter: Payer: Self-pay | Admitting: Family Medicine

## 2016-09-05 ENCOUNTER — Ambulatory Visit (INDEPENDENT_AMBULATORY_CARE_PROVIDER_SITE_OTHER): Payer: Self-pay

## 2016-09-05 DIAGNOSIS — Z3201 Encounter for pregnancy test, result positive: Secondary | ICD-10-CM

## 2016-09-05 DIAGNOSIS — Z3A01 Less than 8 weeks gestation of pregnancy: Secondary | ICD-10-CM

## 2016-09-05 LAB — POCT PREGNANCY, URINE: Preg Test, Ur: POSITIVE — AB

## 2016-09-05 NOTE — Progress Notes (Signed)
Pt presents to the clinic for a pregnancy test.  Pregnancy Test was POSITIVE Pt states her LMP was Oct 1st 2017    According to the pregnancy wheel and LMP 3047w2d EDD: July 8th 2018  Informed pt of changes she need to make to her medications and provided her with a list of safe pregnancy medications. Pt was to check out at the front desk for pregnancy verification and for instruction where to start her care. Pt states understanding.

## 2016-10-10 ENCOUNTER — Encounter: Payer: Self-pay | Admitting: *Deleted

## 2016-10-10 ENCOUNTER — Other Ambulatory Visit (HOSPITAL_COMMUNITY)
Admission: RE | Admit: 2016-10-10 | Discharge: 2016-10-10 | Disposition: A | Discharge status: Home / Self Care | Payer: Medicaid Other | Source: Ambulatory Visit | Attending: Obstetrics & Gynecology | Admitting: Obstetrics & Gynecology

## 2016-10-10 ENCOUNTER — Encounter: Payer: Self-pay | Admitting: Obstetrics & Gynecology

## 2016-10-10 ENCOUNTER — Ambulatory Visit (INDEPENDENT_AMBULATORY_CARE_PROVIDER_SITE_OTHER): Payer: Medicaid Other | Admitting: Obstetrics & Gynecology

## 2016-10-10 VITALS — BP 117/74 | HR 88 | Wt 190.0 lb

## 2016-10-10 DIAGNOSIS — Z23 Encounter for immunization: Secondary | ICD-10-CM

## 2016-10-10 DIAGNOSIS — Z3490 Encounter for supervision of normal pregnancy, unspecified, unspecified trimester: Secondary | ICD-10-CM | POA: Insufficient documentation

## 2016-10-10 DIAGNOSIS — N390 Urinary tract infection, site not specified: Secondary | ICD-10-CM

## 2016-10-10 DIAGNOSIS — Z113 Encounter for screening for infections with a predominantly sexual mode of transmission: Secondary | ICD-10-CM | POA: Diagnosis present

## 2016-10-10 DIAGNOSIS — Z01419 Encounter for gynecological examination (general) (routine) without abnormal findings: Secondary | ICD-10-CM | POA: Diagnosis not present

## 2016-10-10 DIAGNOSIS — Z3491 Encounter for supervision of normal pregnancy, unspecified, first trimester: Secondary | ICD-10-CM | POA: Diagnosis not present

## 2016-10-10 DIAGNOSIS — B964 Proteus (mirabilis) (morganii) as the cause of diseases classified elsewhere: Secondary | ICD-10-CM

## 2016-10-10 DIAGNOSIS — Z3689 Encounter for other specified antenatal screening: Secondary | ICD-10-CM

## 2016-10-10 NOTE — Patient Instructions (Signed)
First Trimester of Pregnancy  The first trimester of pregnancy is from week 1 until the end of week 12 (months 1 through 3). A week after a sperm fertilizes an egg, the egg will implant on the wall of the uterus. This embryo will begin to develop into a baby. Genes from you and your partner are forming the baby. The female genes determine whether the baby is a boy or a girl. At 6-8 weeks, the eyes and face are formed, and the heartbeat can be seen on ultrasound. At the end of 12 weeks, all the baby's organs are formed.   Now that you are pregnant, you will want to do everything you can to have a healthy baby. Two of the most important things are to get good prenatal care and to follow your health care provider's instructions. Prenatal care is all the medical care you receive before the baby's birth. This care will help prevent, find, and treat any problems during the pregnancy and childbirth.  BODY CHANGES  Your body goes through many changes during pregnancy. The changes vary from woman to woman.   · You may gain or lose a couple of pounds at first.  · You may feel sick to your stomach (nauseous) and throw up (vomit). If the vomiting is uncontrollable, call your health care provider.  · You may tire easily.  · You may develop headaches that can be relieved by medicines approved by your health care provider.  · You may urinate more often. Painful urination may mean you have a bladder infection.  · You may develop heartburn as a result of your pregnancy.  · You may develop constipation because certain hormones are causing the muscles that push waste through your intestines to slow down.  · You may develop hemorrhoids or swollen, bulging veins (varicose veins).  · Your breasts may begin to grow larger and become tender. Your nipples may stick out more, and the tissue that surrounds them (areola) may become darker.  · Your gums may bleed and may be sensitive to brushing and flossing.   · Dark spots or blotches (chloasma, mask of pregnancy) may develop on your face. This will likely fade after the baby is born.  · Your menstrual periods will stop.  · You may have a loss of appetite.  · You may develop cravings for certain kinds of food.  · You may have changes in your emotions from day to day, such as being excited to be pregnant or being concerned that something may go wrong with the pregnancy and baby.  · You may have more vivid and strange dreams.  · You may have changes in your hair. These can include thickening of your hair, rapid growth, and changes in texture. Some women also have hair loss during or after pregnancy, or hair that feels dry or thin. Your hair will most likely return to normal after your baby is born.  WHAT TO EXPECT AT YOUR PRENATAL VISITS  During a routine prenatal visit:  · You will be weighed to make sure you and the baby are growing normally.  · Your blood pressure will be taken.  · Your abdomen will be measured to track your baby's growth.  · The fetal heartbeat will be listened to starting around week 10 or 12 of your pregnancy.  · Test results from any previous visits will be discussed.  Your health care provider may ask you:  · How you are feeling.  · If you   are feeling the baby move.  · If you have had any abnormal symptoms, such as leaking fluid, bleeding, severe headaches, or abdominal cramping.  · If you are using any tobacco products, including cigarettes, chewing tobacco, and electronic cigarettes.  · If you have any questions.  Other tests that may be performed during your first trimester include:  · Blood tests to find your blood type and to check for the presence of any previous infections. They will also be used to check for low iron levels (anemia) and Rh antibodies. Later in the pregnancy, blood tests for diabetes will be done along with other tests if problems develop.  · Urine tests to check for infections, diabetes, or protein in the urine.   · An ultrasound to confirm the proper growth and development of the baby.  · An amniocentesis to check for possible genetic problems.  · Fetal screens for spina bifida and Down syndrome.  · You may need other tests to make sure you and the baby are doing well.  · HIV (human immunodeficiency virus) testing. Routine prenatal testing includes screening for HIV, unless you choose not to have this test.  HOME CARE INSTRUCTIONS   Medicines  · Follow your health care provider's instructions regarding medicine use. Specific medicines may be either safe or unsafe to take during pregnancy.  · Take your prenatal vitamins as directed.  · If you develop constipation, try taking a stool softener if your health care provider approves.  Diet  · Eat regular, well-balanced meals. Choose a variety of foods, such as meat or vegetable-based protein, fish, milk and low-fat dairy products, vegetables, fruits, and whole grain breads and cereals. Your health care provider will help you determine the amount of weight gain that is right for you.  · Avoid raw meat and uncooked cheese. These carry germs that can cause birth defects in the baby.  · Eating four or five small meals rather than three large meals a day may help relieve nausea and vomiting. If you start to feel nauseous, eating a few soda crackers can be helpful. Drinking liquids between meals instead of during meals also seems to help nausea and vomiting.  · If you develop constipation, eat more high-fiber foods, such as fresh vegetables or fruit and whole grains. Drink enough fluids to keep your urine clear or pale yellow.  Activity and Exercise  · Exercise only as directed by your health care provider. Exercising will help you:    Control your weight.    Stay in shape.    Be prepared for labor and delivery.  · Experiencing pain or cramping in the lower abdomen or low back is a good sign that you should stop exercising. Check with your health care provider  before continuing normal exercises.  · Try to avoid standing for long periods of time. Move your legs often if you must stand in one place for a long time.  · Avoid heavy lifting.  · Wear low-heeled shoes, and practice good posture.  · You may continue to have sex unless your health care provider directs you otherwise.  Relief of Pain or Discomfort  · Wear a good support bra for breast tenderness.    · Take warm sitz baths to soothe any pain or discomfort caused by hemorrhoids. Use hemorrhoid cream if your health care provider approves.    · Rest with your legs elevated if you have leg cramps or low back pain.  · If you develop varicose veins in your   legs, wear support hose. Elevate your feet for 15 minutes, 3-4 times a day. Limit salt in your diet.  Prenatal Care  · Schedule your prenatal visits by the twelfth week of pregnancy. They are usually scheduled monthly at first, then more often in the last 2 months before delivery.  · Write down your questions. Take them to your prenatal visits.  · Keep all your prenatal visits as directed by your health care provider.  Safety  · Wear your seat belt at all times when driving.  · Make a list of emergency phone numbers, including numbers for family, friends, the hospital, and police and fire departments.  General Tips  · Ask your health care provider for a referral to a local prenatal education class. Begin classes no later than at the beginning of month 6 of your pregnancy.  · Ask for help if you have counseling or nutritional needs during pregnancy. Your health care provider can offer advice or refer you to specialists for help with various needs.  · Do not use hot tubs, steam rooms, or saunas.  · Do not douche or use tampons or scented sanitary pads.  · Do not cross your legs for long periods of time.  · Avoid cat litter boxes and soil used by cats. These carry germs that can cause birth defects in the baby and possibly loss of the fetus by miscarriage or stillbirth.   · Avoid all smoking, herbs, alcohol, and medicines not prescribed by your health care provider. Chemicals in these affect the formation and growth of the baby.  · Do not use any tobacco products, including cigarettes, chewing tobacco, and electronic cigarettes. If you need help quitting, ask your health care provider. You may receive counseling support and other resources to help you quit.  · Schedule a dentist appointment. At home, brush your teeth with a soft toothbrush and be gentle when you floss.  SEEK MEDICAL CARE IF:   · You have dizziness.  · You have mild pelvic cramps, pelvic pressure, or nagging pain in the abdominal area.  · You have persistent nausea, vomiting, or diarrhea.  · You have a bad smelling vaginal discharge.  · You have pain with urination.  · You notice increased swelling in your face, hands, legs, or ankles.  SEEK IMMEDIATE MEDICAL CARE IF:   · You have a fever.  · You are leaking fluid from your vagina.  · You have spotting or bleeding from your vagina.  · You have severe abdominal cramping or pain.  · You have rapid weight gain or loss.  · You vomit blood or material that looks like coffee grounds.  · You are exposed to German measles and have never had them.  · You are exposed to fifth disease or chickenpox.  · You develop a severe headache.  · You have shortness of breath.  · You have any kind of trauma, such as from a fall or a car accident.     This information is not intended to replace advice given to you by your health care provider. Make sure you discuss any questions you have with your health care provider.     Document Released: 10/10/2001 Document Revised: 11/06/2014 Document Reviewed: 08/26/2013  [employer] Interactive Patient Education ©2017 [employer]

## 2016-10-10 NOTE — Progress Notes (Signed)
Subjective:   Hannah Ball is a 25 y.o. G2P1001 at 3450w2d by LMP, early ultrasound being seen today for her first obstetrical visit.  Her obstetrical history is significant for one term SVD. Patient does not intend to breast feed. Pregnancy history fully reviewed.  Patient reports no complaints.  HISTORY: Obstetric History   G2   P1   T1   P0   A0   L1    SAB0   TAB0   Ectopic0   Multiple0   Live Births1     # Outcome Date GA Lbr Len/2nd Weight Sex Delivery Anes PTL Lv  2 Current           1 Term      Vag-Spont EPI  LIV     Complications: Ovarian cyst during pregnancy     Past Medical History:  Diagnosis Date  . Ovarian cyst    No past surgical history on file. No family history on file. Social History  Substance Use Topics  . Smoking status: Never Smoker  . Smokeless tobacco: Never Used  . Alcohol use Yes   Allergies  Allergen Reactions  . Benadryl [Diphenhydramine] Hives  . Neosporin Plus Max St Other (See Comments)    Skin burns  . Latex Rash  . Penicillins Rash    Has patient had a PCN reaction causing immediate rash, facial/tongue/throat swelling, SOB or lightheadedness with hypotension: Yes Has patient had a PCN reaction causing severe rash involving mucus membranes or skin necrosis: No Has patient had a PCN reaction that required hospitalization No Has patient had a PCN reaction occurring within the last 10 years: No If all of the above answers are "NO", then may proceed with Cephalosporin use.    No current outpatient prescriptions on file prior to visit.   No current facility-administered medications on file prior to visit.      Exam   There were no vitals filed for this visit.    Uterus:     Pelvic Exam: Perineum: no hemorrhoids, normal perineum   Vulva: normal external genitalia, no lesions   Vagina:  normal mucosa, normal discharge   Cervix: no lesions and normal, pap smear done.    Adnexa: normal adnexa and no mass, fullness, tenderness    Bony Pelvis: average  System: General: well-developed, well-nourished female in no acute distress   Breast:  normal appearance, no masses or tenderness   Skin: normal coloration and turgor, no rashes   Neurologic: oriented, normal, negative, normal mood   Extremities: normal strength, tone, and muscle mass, ROM of all joints is normal   HEENT PERRLA, extraocular movement intact and sclera clear, anicteric   Mouth/Teeth mucous membranes moist, pharynx normal without lesions and dental hygiene good   Neck supple and no masses   Cardiovascular: regular rate and rhythm   Respiratory:  no respiratory distress, normal breath sounds   Abdomen: soft, non-tender; bowel sounds normal; no masses,  no organomegaly     Assessment:   Pregnancy: G2P1001 Patient Active Problem List   Diagnosis Date Noted  . Supervision of normal pregnancy, antepartum 10/10/2016     Plan:  1. Encounter for supervision of normal pregnancy, antepartum, unspecified gravidity - Culture, OB Urine - GC[facility]Chlam[facility] probe amp [facility] - Prenatal Profile - Cytology - PAP - Cystic fibrosis diagnostic study - Pain Mgmt, Profile 6 Conf w/o mM, U - US bedside; Future - Flu Vaccine QUAD 36+ mos IM (Fluarix, Quad PF) -  US MFM Fetal Nuchal Translucency; Future She will participate in Babyscripts. Initial labs drawn. Continue prenatal vitamins. Genetic Screening discussed, First trimester screen: ordered. Ultrasound discussed; fetal anatomic survey: to be ordered. Problem list reviewed and updated. The nature [facility] with multiple MDs and other Advanced Practice Providers was explained to patient; also emphasized that residents, students are part of our team. Routine obstetric precautions reviewed. Return in about 2 weeks (around 10/24/2016) for OB Visit. (12 week Babyscripts)     Jaynie CollinsUGONNA  [name], MD, FACOG Attending Obstetrician & Gynecologist, [facility]  [facility], [facility]

## 2016-10-11 LAB — PRENATAL PROFILE (SOLSTAS)
ANTIBODY SCREEN: NEGATIVE
BASOS PCT: 0 %
Basophils Absolute: 0 cells/uL (ref 0–200)
Eosinophils Absolute: 0 cells/uL — ABNORMAL LOW (ref 15–500)
Eosinophils Relative: 0 %
HEMATOCRIT: 45.8 % — AB (ref 35.0–45.0)
HIV 1&2 Ab, 4th Generation: NONREACTIVE
Hemoglobin: 15 g/dL (ref 11.7–15.5)
Hepatitis B Surface Ag: NEGATIVE
LYMPHS PCT: 17 %
Lymphs Abs: 1020 cells/uL (ref 850–3900)
MCH: 27.2 pg (ref 27.0–33.0)
MCHC: 32.8 g/dL (ref 32.0–36.0)
MCV: 83.1 fL (ref 80.0–100.0)
MONO ABS: 360 {cells}/uL (ref 200–950)
MONOS PCT: 6 %
NEUTROS ABS: 4620 {cells}/uL (ref 1500–7800)
Neutrophils Relative %: 77 %
Platelets: 149 10*3/uL (ref 140–400)
RBC: 5.51 MIL/uL — AB (ref 3.80–5.10)
RDW: 14.1 % (ref 11.0–15.0)
RH TYPE: NEGATIVE
Rubella: 0.9 Index — ABNORMAL HIGH (ref <0.90)
WBC: 6 10*3/uL (ref 3.8–10.8)

## 2016-10-11 LAB — PAIN MGMT, PROFILE 6 CONF W/O MM, U
6 Acetylmorphine: NEGATIVE ng/mL (ref <10)
AMPHETAMINES: NEGATIVE ng/mL (ref <500)
Alcohol Metabolites: NEGATIVE ng/mL (ref <500)
Barbiturates: NEGATIVE ng/mL (ref <300)
Benzodiazepines: NEGATIVE ng/mL (ref <100)
Cocaine Metabolite: NEGATIVE ng/mL (ref <150)
Creatinine: 223.6 mg/dL (ref >=20.0)
Marijuana Metabolite: NEGATIVE ng/mL (ref <20)
Methadone Metabolite: NEGATIVE ng/mL (ref <100)
OPIATES: NEGATIVE ng/mL (ref <100)
OXIDANT: NEGATIVE ug/mL (ref <200)
Oxycodone: NEGATIVE ng/mL (ref <100)
PLEASE NOTE: 0
Phencyclidine: NEGATIVE ng/mL (ref <25)
pH: 8.45 (ref 4.5–9.0)

## 2016-10-11 LAB — OB RESULTS CONSOLE GC/CHLAMYDIA: GC PROBE AMP, GENITAL: NEGATIVE

## 2016-10-11 LAB — GC/CHLAMYDIA PROBE AMP (~~LOC~~) NOT AT ARMC
CHLAMYDIA, DNA PROBE: NEGATIVE
NEISSERIA GONORRHEA: NEGATIVE

## 2016-10-12 LAB — CYTOLOGY - PAP: Diagnosis: NEGATIVE

## 2016-10-13 LAB — CULTURE, OB URINE

## 2016-10-16 ENCOUNTER — Encounter: Payer: Self-pay | Admitting: Obstetrics & Gynecology

## 2016-10-16 DIAGNOSIS — O26899 Other specified pregnancy related conditions, unspecified trimester: Secondary | ICD-10-CM | POA: Insufficient documentation

## 2016-10-16 DIAGNOSIS — Z6791 Unspecified blood type, Rh negative: Secondary | ICD-10-CM | POA: Insufficient documentation

## 2016-10-16 LAB — CYSTIC FIBROSIS DIAGNOSTIC STUDY

## 2016-10-16 MED ORDER — SULFAMETHOXAZOLE-TRIMETHOPRIM 800-160 MG PO TABS: 1.0000 | ORAL_TABLET | Freq: Two times a day (BID) | ORAL | 1 refills | Status: DC

## 2016-10-16 NOTE — Addendum Note (Signed)
Addended by: Jaynie CollinsANYANWU, Sumaiya Arruda A on: 10/16/2016 01:16 PM   Modules accepted: Orders

## 2016-10-17 ENCOUNTER — Encounter (HOSPITAL_COMMUNITY): Payer: Self-pay | Admitting: Obstetrics & Gynecology

## 2016-10-26 ENCOUNTER — Ambulatory Visit (INDEPENDENT_AMBULATORY_CARE_PROVIDER_SITE_OTHER): Payer: Self-pay | Admitting: Family Medicine

## 2016-10-26 VITALS — BP 104/66 | HR 93 | Wt 187.0 lb

## 2016-10-26 DIAGNOSIS — O219 Vomiting of pregnancy, unspecified: Secondary | ICD-10-CM

## 2016-10-26 DIAGNOSIS — Z6791 Unspecified blood type, Rh negative: Secondary | ICD-10-CM

## 2016-10-26 DIAGNOSIS — Z3492 Encounter for supervision of normal pregnancy, unspecified, second trimester: Secondary | ICD-10-CM

## 2016-10-26 DIAGNOSIS — Z3490 Encounter for supervision of normal pregnancy, unspecified, unspecified trimester: Secondary | ICD-10-CM

## 2016-10-26 DIAGNOSIS — O26899 Other specified pregnancy related conditions, unspecified trimester: Secondary | ICD-10-CM

## 2016-10-26 DIAGNOSIS — Z3481 Encounter for supervision of other normal pregnancy, first trimester: Secondary | ICD-10-CM | POA: Diagnosis not present

## 2016-10-26 HISTORY — DX: Vomiting of pregnancy, unspecified: O21.9

## 2016-10-26 NOTE — Patient Instructions (Signed)
First Trimester of Pregnancy  The first trimester of pregnancy is from week 1 until the end of week 12 (months 1 through 3). A week after a sperm fertilizes an egg, the egg will implant on the wall of the uterus. This embryo will begin to develop into a baby. Genes from you and your partner are forming the baby. The female genes determine whether the baby is a boy or a girl. At 6-8 weeks, the eyes and face are formed, and the heartbeat can be seen on ultrasound. At the end of 12 weeks, all the baby's organs are formed.   Now that you are pregnant, you will want to do everything you can to have a healthy baby. Two of the most important things are to get good prenatal care and to follow your health care provider's instructions. Prenatal care is all the medical care you receive before the baby's birth. This care will help prevent, find, and treat any problems during the pregnancy and childbirth.  BODY CHANGES  Your body goes through many changes during pregnancy. The changes vary from woman to woman.   · You may gain or lose a couple of pounds at first.  · You may feel sick to your stomach (nauseous) and throw up (vomit). If the vomiting is uncontrollable, call your health care provider.  · You may tire easily.  · You may develop headaches that can be relieved by medicines approved by your health care provider.  · You may urinate more often. Painful urination may mean you have a bladder infection.  · You may develop heartburn as a result of your pregnancy.  · You may develop constipation because certain hormones are causing the muscles that push waste through your intestines to slow down.  · You may develop hemorrhoids or swollen, bulging veins (varicose veins).  · Your breasts may begin to grow larger and become tender. Your nipples may stick out more, and the tissue that surrounds them (areola) may become darker.  · Your gums may bleed and may be sensitive to brushing and flossing.   · Dark spots or blotches (chloasma, mask of pregnancy) may develop on your face. This will likely fade after the baby is born.  · Your menstrual periods will stop.  · You may have a loss of appetite.  · You may develop cravings for certain kinds of food.  · You may have changes in your emotions from day to day, such as being excited to be pregnant or being concerned that something may go wrong with the pregnancy and baby.  · You may have more vivid and strange dreams.  · You may have changes in your hair. These can include thickening of your hair, rapid growth, and changes in texture. Some women also have hair loss during or after pregnancy, or hair that feels dry or thin. Your hair will most likely return to normal after your baby is born.  WHAT TO EXPECT AT YOUR PRENATAL VISITS  During a routine prenatal visit:  · You will be weighed to make sure you and the baby are growing normally.  · Your blood pressure will be taken.  · Your abdomen will be measured to track your baby's growth.  · The fetal heartbeat will be listened to starting around week 10 or 12 of your pregnancy.  · Test results from any previous visits will be discussed.  Your health care provider may ask you:  · How you are feeling.  · If you   are feeling the baby move.  · If you have had any abnormal symptoms, such as leaking fluid, bleeding, severe headaches, or abdominal cramping.  · If you are using any tobacco products, including cigarettes, chewing tobacco, and electronic cigarettes.  · If you have any questions.  Other tests that may be performed during your first trimester include:  · Blood tests to find your blood type and to check for the presence of any previous infections. They will also be used to check for low iron levels (anemia) and Rh antibodies. Later in the pregnancy, blood tests for diabetes will be done along with other tests if problems develop.  · Urine tests to check for infections, diabetes, or protein in the urine.   · An ultrasound to confirm the proper growth and development of the baby.  · An amniocentesis to check for possible genetic problems.  · Fetal screens for spina bifida and Down syndrome.  · You may need other tests to make sure you and the baby are doing well.  · HIV (human immunodeficiency virus) testing. Routine prenatal testing includes screening for HIV, unless you choose not to have this test.  HOME CARE INSTRUCTIONS   Medicines  · Follow your health care provider's instructions regarding medicine use. Specific medicines may be either safe or unsafe to take during pregnancy.  · Take your prenatal vitamins as directed.  · If you develop constipation, try taking a stool softener if your health care provider approves.  Diet  · Eat regular, well-balanced meals. Choose a variety of foods, such as meat or vegetable-based protein, fish, milk and low-fat dairy products, vegetables, fruits, and whole grain breads and cereals. Your health care provider will help you determine the amount of weight gain that is right for you.  · Avoid raw meat and uncooked cheese. These carry germs that can cause birth defects in the baby.  · Eating four or five small meals rather than three large meals a day may help relieve nausea and vomiting. If you start to feel nauseous, eating a few soda crackers can be helpful. Drinking liquids between meals instead of during meals also seems to help nausea and vomiting.  · If you develop constipation, eat more high-fiber foods, such as fresh vegetables or fruit and whole grains. Drink enough fluids to keep your urine clear or pale yellow.  Activity and Exercise  · Exercise only as directed by your health care provider. Exercising will help you:    Control your weight.    Stay in shape.    Be prepared for labor and delivery.  · Experiencing pain or cramping in the lower abdomen or low back is a good sign that you should stop exercising. Check with your health care provider  before continuing normal exercises.  · Try to avoid standing for long periods of time. Move your legs often if you must stand in one place for a long time.  · Avoid heavy lifting.  · Wear low-heeled shoes, and practice good posture.  · You may continue to have sex unless your health care provider directs you otherwise.  Relief of Pain or Discomfort  · Wear a good support bra for breast tenderness.    · Take warm sitz baths to soothe any pain or discomfort caused by hemorrhoids. Use hemorrhoid cream if your health care provider approves.    · Rest with your legs elevated if you have leg cramps or low back pain.  · If you develop varicose veins in your   legs, wear support hose. Elevate your feet for 15 minutes, 3-4 times a day. Limit salt in your diet.  Prenatal Care  · Schedule your prenatal visits by the twelfth week of pregnancy. They are usually scheduled monthly at first, then more often in the last 2 months before delivery.  · Write down your questions. Take them to your prenatal visits.  · Keep all your prenatal visits as directed by your health care provider.  Safety  · Wear your seat belt at all times when driving.  · Make a list of emergency phone numbers, including numbers for family, friends, the hospital, and police and fire departments.  General Tips  · Ask your health care provider for a referral to a local prenatal education class. Begin classes no later than at the beginning of month 6 of your pregnancy.  · Ask for help if you have counseling or nutritional needs during pregnancy. Your health care provider can offer advice or refer you to specialists for help with various needs.  · Do not use hot tubs, steam rooms, or saunas.  · Do not douche or use tampons or scented sanitary pads.  · Do not cross your legs for long periods of time.  · Avoid cat litter boxes and soil used by cats. These carry germs that can cause birth defects in the baby and possibly loss of the fetus by miscarriage or stillbirth.   · Avoid all smoking, herbs, alcohol, and medicines not prescribed by your health care provider. Chemicals in these affect the formation and growth of the baby.  · Do not use any tobacco products, including cigarettes, chewing tobacco, and electronic cigarettes. If you need help quitting, ask your health care provider. You may receive counseling support and other resources to help you quit.  · Schedule a dentist appointment. At home, brush your teeth with a soft toothbrush and be gentle when you floss.  SEEK MEDICAL CARE IF:   · You have dizziness.  · You have mild pelvic cramps, pelvic pressure, or nagging pain in the abdominal area.  · You have persistent nausea, vomiting, or diarrhea.  · You have a bad smelling vaginal discharge.  · You have pain with urination.  · You notice increased swelling in your face, hands, legs, or ankles.  SEEK IMMEDIATE MEDICAL CARE IF:   · You have a fever.  · You are leaking fluid from your vagina.  · You have spotting or bleeding from your vagina.  · You have severe abdominal cramping or pain.  · You have rapid weight gain or loss.  · You vomit blood or material that looks like coffee grounds.  · You are exposed to German measles and have never had them.  · You are exposed to fifth disease or chickenpox.  · You develop a severe headache.  · You have shortness of breath.  · You have any kind of trauma, such as from a fall or a car accident.     This information is not intended to replace advice given to you by your health care provider. Make sure you discuss any questions you have with your health care provider.     Document Released: 10/10/2001 Document Revised: 11/06/2014 Document Reviewed: 08/26/2013  [employer] Interactive Patient Education ©2017 [employer]

## 2016-10-26 NOTE — Progress Notes (Signed)
   PRENATAL VISIT NOTE  Subjective:  Hannah Ball is a 25 y.o. G2P1001 at 3098w4d being seen today for ongoing prenatal care.  She is currently monitored for the following issues for this low-risk pregnancy and has Supervision of normal pregnancy, antepartum and Rh negative, antepartum on her problem list.  Patient reports nausea and vomiting.  Contractions: Not present. Vag. Bleeding: None.  Movement: Absent. Denies leaking of fluid.   The following portions of the patient's history were reviewed and updated as appropriate: allergies, current medications, past family history, past medical history, past social history, past surgical history and problem list. Problem list updated.  Objective:   Vitals:   10/26/16 1050  BP: 104/66  Pulse: 93  Weight: 187 lb (84.8 kg)    Fetal Status: Fetal Heart Rate (bpm): 153   Movement: Absent     General:  Alert, oriented and cooperative. Patient is in no acute distress.  Skin: Skin is warm and dry. No rash noted.   Cardiovascular: Normal heart rate noted  Respiratory: Normal respiratory effort, no problems with respiration noted  Abdomen: Soft, gravid, appropriate for gestational age. Pain/Pressure: Present     Pelvic:  Cervical exam deferred        Extremities: Normal range of motion.  Edema: Trace  Mental Status: Normal mood and affect. Normal behavior. Normal judgment and thought content.   Assessment and Plan:  Pregnancy: G2P1001 at 3298w4d  1. Encounter for supervision of normal pregnancy, antepartum, unspecified gravidity - US MFM OB COMP + 14 WK; Future  2. N/V preg - Currently able to maintain hydration and eat minimally.  TWL 3 lbs.  - Reviewed N/V and use of B6/Doxyalamine for sx reduction. RN gave samples for diclegis. - Discussed calling if sx persist or unable to tolerate liquids given risk of dehydration   Preterm/miscarriage general obstetric precautions including but not limited to vaginal bleeding, contractions, leaking of  fluid and fetal movement were reviewed in detail with the patient. Please refer to After Visit Summary for other counseling recommendations.  Return in about 8 weeks (around 12/21/2016).   Federico FlakeKimberly Niles Loella Hickle, MD

## 2016-10-30 NOTE — L&D Delivery Note (Signed)
Operative Delivery Note At 2:52 AM a viable female was delivered via .  Presentation: vertex; Position: Right,, Occiput,, Anterior; Station: +3.  Verbal consent: obtained from patient.  Risks and benefits discussed in detail.  Risks include, but are not limited to the risks of anesthesia, bleeding, infection, damage to maternal tissues, fetal cephalhematoma.  There is also the risk of inability to effect vaginal delivery of the head, or shoulder dystocia that cannot be resolved by established maneuvers, leading to the need for emergency cesarean section.  Indication: maternal exhaustion Kiwi Vacuum extractor placed. Traction applied along plane over three contractions with good descent. One pop-off. Tight shoulders, but no dystocia.   APGAR: 8, 9; weight pending.   Placenta status: spontaneous, intact.   Cord: 3VC with the following complications: none.    Anesthesia:  epidural Instruments: correct x2 Episiotomy:  none Lacerations:  none Est. Blood Loss (mL):  50 mL  Mom to postpartum.  Baby to Couplet care / Skin to Skin.  Hannah HeritageJacob J Ball 05/09/2017, 3:08 AM

## 2016-11-01 ENCOUNTER — Ambulatory Visit (HOSPITAL_COMMUNITY)
Admission: RE | Admit: 2016-11-01 | Discharge: 2016-11-01 | Disposition: A | Discharge status: Home / Self Care | Payer: Medicaid Other | Source: Ambulatory Visit | Attending: Obstetrics & Gynecology | Admitting: Obstetrics & Gynecology

## 2016-11-01 ENCOUNTER — Encounter (HOSPITAL_COMMUNITY): Payer: Self-pay

## 2016-11-01 DIAGNOSIS — Z3A13 13 weeks gestation of pregnancy: Secondary | ICD-10-CM | POA: Insufficient documentation

## 2016-11-01 DIAGNOSIS — Z3490 Encounter for supervision of normal pregnancy, unspecified, unspecified trimester: Secondary | ICD-10-CM

## 2016-11-01 DIAGNOSIS — Z3491 Encounter for supervision of normal pregnancy, unspecified, first trimester: Secondary | ICD-10-CM | POA: Insufficient documentation

## 2016-11-03 ENCOUNTER — Encounter: Payer: Self-pay | Admitting: Emergency Medicine

## 2016-11-03 ENCOUNTER — Emergency Department
Admission: EM | Admit: 2016-11-03 | Discharge: 2016-11-03 | Disposition: A | Discharge status: Home / Self Care | Payer: Medicaid Other | Attending: Emergency Medicine | Admitting: Emergency Medicine

## 2016-11-03 DIAGNOSIS — Z91040 Latex allergy status: Secondary | ICD-10-CM | POA: Insufficient documentation

## 2016-11-03 DIAGNOSIS — Z3490 Encounter for supervision of normal pregnancy, unspecified, unspecified trimester: Secondary | ICD-10-CM

## 2016-11-03 DIAGNOSIS — O2341 Unspecified infection of urinary tract in pregnancy, first trimester: Secondary | ICD-10-CM | POA: Diagnosis not present

## 2016-11-03 DIAGNOSIS — Z3A14 14 weeks gestation of pregnancy: Secondary | ICD-10-CM | POA: Insufficient documentation

## 2016-11-03 DIAGNOSIS — N390 Urinary tract infection, site not specified: Secondary | ICD-10-CM

## 2016-11-03 DIAGNOSIS — O26891 Other specified pregnancy related conditions, first trimester: Secondary | ICD-10-CM | POA: Diagnosis present

## 2016-11-03 LAB — COMPREHENSIVE METABOLIC PANEL
ALK PHOS: 33 U/L — AB (ref 38–126)
ALT: 21 U/L (ref 14–54)
ANION GAP: 6 (ref 5–15)
AST: 22 U/L (ref 15–41)
Albumin: 3.7 g/dL (ref 3.5–5.0)
BILIRUBIN TOTAL: 0.7 mg/dL (ref 0.3–1.2)
BUN: 6 mg/dL (ref 6–20)
CALCIUM: 8.9 mg/dL (ref 8.9–10.3)
CO2: 20 mmol/L — ABNORMAL LOW (ref 22–32)
Chloride: 109 mmol/L (ref 101–111)
Creatinine, Ser: 0.48 mg/dL (ref 0.44–1.00)
GFR calc Af Amer: >60 mL/min (ref >60)
GFR calc non Af Amer: >60 mL/min (ref >60)
GLUCOSE: 90 mg/dL (ref 65–99)
POTASSIUM: 3.6 mmol/L (ref 3.5–5.1)
Sodium: 135 mmol/L (ref 135–145)
TOTAL PROTEIN: 7.1 g/dL (ref 6.5–8.1)

## 2016-11-03 LAB — URINALYSIS, COMPLETE (UACMP) WITH MICROSCOPIC
BILIRUBIN URINE: NEGATIVE
GLUCOSE, UA: NEGATIVE mg/dL
Ketones, ur: 20 mg/dL — AB
NITRITE: NEGATIVE
Protein, ur: 30 mg/dL — AB
SPECIFIC GRAVITY, URINE: 1.025 (ref 1.005–1.030)
pH: 5 (ref 5.0–8.0)

## 2016-11-03 LAB — CBC
HEMATOCRIT: 43.6 % (ref 35.0–47.0)
HEMOGLOBIN: 14.4 g/dL (ref 12.0–16.0)
MCH: 27 pg (ref 26.0–34.0)
MCHC: 33 g/dL (ref 32.0–36.0)
MCV: 81.8 fL (ref 80.0–100.0)
Platelets: 160 10*3/uL (ref 150–440)
RBC: 5.33 MIL/uL — ABNORMAL HIGH (ref 3.80–5.20)
RDW: 15.1 % — AB (ref 11.5–14.5)
WBC: 5.6 10*3/uL (ref 3.6–11.0)

## 2016-11-03 LAB — LIPASE, BLOOD: Lipase: 30 U/L (ref 11–51)

## 2016-11-03 MED ORDER — NITROFURANTOIN MONOHYD MACRO 100 MG PO CAPS: 100.0000 mg | ORAL_CAPSULE | Freq: Two times a day (BID) | ORAL | 0 refills | Status: AC

## 2016-11-03 MED ORDER — ACETAMINOPHEN 500 MG PO TABS
1000.0000 mg | ORAL_TABLET | ORAL | Status: AC
Start: 2016-11-03 — End: 2016-11-03
  Administered 2016-11-03: 1000 mg via ORAL
  Filled 2016-11-03: qty 2

## 2016-11-03 MED ORDER — NITROFURANTOIN MONOHYD MACRO 100 MG PO CAPS
100.0000 mg | ORAL_CAPSULE | Freq: Once | ORAL | Status: AC
  Administered 2016-11-03: 100 mg via ORAL
  Filled 2016-11-03: qty 1

## 2016-11-03 MED ORDER — SODIUM CHLORIDE 0.9 % IV BOLUS (SEPSIS)
1000.0000 mL | Freq: Once | INTRAVENOUS | Status: AC
  Administered 2016-11-03: 1000 mL via INTRAVENOUS

## 2016-11-03 MED ORDER — NITROFURANTOIN MONOHYD MACRO 100 MG PO CAPS
ORAL_CAPSULE | ORAL | Status: AC
  Administered 2016-11-03: 100 mg via ORAL
  Filled 2016-11-03: qty 1

## 2016-11-03 NOTE — ED Triage Notes (Signed)
Patient to ED via POV c/o lower abdominal pain that started yesterday. Patient states that she is approximately [redacted] weeks pregnant, patient last seen OBGYN on Wednesday and was told everything looked out. Patient denies vaginal bleeding.

## 2016-11-03 NOTE — Discharge Instructions (Signed)
You have been seen in the Emergency Department (ED) today for pain when urinating.  Your workup today suggests that you have a urinary tract infection (UTI). ° ° °Call your regular doctor to schedule the next available appointment to follow up on today’s ED visit, or return immediately to the ED if your pain worsens, you have decreased urine production, develop fever, persistent vomiting, or other symptoms that concern you. ° °

## 2016-11-03 NOTE — ED Notes (Signed)
Pt reports abd pain for 4 days.  Pt states vomiting x 4 today.  Diarrhea x 2.  Pt states pain around navel and both sides of abdomen.  No back pain.  No vag bleeding.  Pt alert.

## 2016-11-03 NOTE — ED Provider Notes (Signed)
[facility] Emergency Department Provider Note   ____________________________________________   First MD Initiated Contact with Patient 11/03/16 1627     (approximate)  I have reviewed the triage vital signs and the nursing notes.   HISTORY  Chief Complaint Abdominal Pain    HPI Hannah Ball is a 26 y.o. female here for evaluation ofnausea, vomiting and diarrhea. Patient reports she is [redacted] weeks pregnant, over the last 4 days she has been having discomfort in her abdomen, upper abdomen somewhat crampy in a sour stomach. She reports she has felt nauseated and vomited 4 times today, had 2 loose stools. No black or bloody vomit or diarrhea. She reports she's had crampy discomfort in the upper abdomen. No pain in her back. No vaginal bleeding. No fluid leak from the vagina.  She reports she's had 2 ultrasounds of her baby, and reports no complications during this pregnancy.  No persistent pain over the right-sided abdomen. No fever or chills. She reports her husband side of the family has all been sick with some type of stomach bug over the last couple of days as well. No headache, no cough, nasal congestion and body aches.    Currently taking diclegis use for nausea which she reports is helpful.  Past Medical History:  Diagnosis Date  . Ovarian cyst   . Type A blood, Rh negative     Patient Active Problem List   Diagnosis Date Noted  . Nausea/vomiting in pregnancy 10/26/2016  . Rh negative, antepartum 10/16/2016  . Supervision of normal pregnancy, antepartum 10/10/2016    Past Surgical History:  Procedure Laterality Date  . LAPAROSCOPIC OVARIAN CYSTECTOMY      Prior to Admission medications   Medication Sig Start Date End Date Taking? Authorizing Provider  Doxylamine-Pyridoxine (DICLEGIS PO) Take by mouth.    Historical Provider, MD  nitrofurantoin, macrocrystal-monohydrate, (MACROBID) 100 MG capsule Take 1 capsule (100 mg total) by mouth 2  (two) times daily. 11/03/16 11/10/16  Sharyn Creamer, MD  Prenatal Vit-Fe Fumarate-FA (PRENATAL VITAMIN PO) Take by mouth.    Historical Provider, MD    Allergies Benadryl [diphenhydramine]; Neosporin plus max st; Latex; and Penicillins  No family history on file.  Social History Social History  Substance Use Topics  . Smoking status: Never Smoker  . Smokeless tobacco: Never Used  . Alcohol use No    Review of Systems Constitutional: No fever/chills Eyes: No visual changes. ENT: No sore throat. Cardiovascular: Denies chest pain. Respiratory: Denies shortness of breath. Gastrointestinal: No abdominal pain for some crampy upper abdominal discomfort.   Genitourinary: Negative for dysuria. Musculoskeletal: Negative for back pain. Skin: Negative for rash. Neurological: Negative for headaches, focal weakness or numbness.  10-point ROS otherwise negative.  ____________________________________________   PHYSICAL EXAM:  VITAL SIGNS: ED Triage Vitals [11/03/16 1549]  Enc Vitals Group     BP      Pulse      Resp      Temp      Temp src      SpO2      Weight 186 lb (84.4 kg)     Height 5\' 9"  (1.753 m)     Head Circumference      Peak Flow      Pain Score 9     Pain Loc      Pain Edu?      Excl. in GC?     Constitutional: Alert and oriented. Well appearing and in no acute distress. Eyes:  Conjunctivae are normal. PERRL. EOMI. Head: Atraumatic. Nose: No congestion/rhinnorhea. Mouth/Throat: Mucous membranes are moist.  Oropharynx non-erythematous. Neck: No stridor.   Cardiovascular: Normal rate, regular rhythm. Grossly normal heart sounds.  Good peripheral circulation. Respiratory: Normal respiratory effort.  No retractions. Lungs CTAB. Gastrointestinal: Soft and nontender.Slightly gravid, palpable just above the pelvic brim. Mild discomfort without rebound or guarding in the epigastrium and left upper quadrant. No distention. No abdominal bruits. No CVA tenderness. Negative  Murphy. Musculoskeletal: No lower extremity tenderness nor edema.  No joint effusions. Neurologic:  Normal speech and language. No gross focal neurologic deficits are appreciated. No gait instability. Skin:  Skin is warm, dry and intact. No rash noted. Psychiatric: Mood and affect are normal. Speech and behavior are normal.  ____________________________________________   LABS (all labs ordered are listed, but only abnormal results are displayed)  Labs Reviewed  COMPREHENSIVE METABOLIC PANEL - Abnormal; Notable for the following:       Result Value   CO2 20 (*)    Alkaline Phosphatase 33 (*)    All other components within normal limits  CBC - Abnormal; Notable for the following:    RBC 5.33 (*)    RDW 15.1 (*)    All other components within normal limits  URINALYSIS, COMPLETE (UACMP) WITH MICROSCOPIC - Abnormal; Notable for the following:    Color, Urine YELLOW (*)    APPearance CLOUDY (*)    Hgb urine dipstick SMALL (*)    Ketones, ur 20 (*)    Protein, ur 30 (*)    Leukocytes, UA LARGE (*)    Bacteria, UA MANY (*)    Squamous Epithelial / LPF 6-30 (*)    All other components within normal limits  LIPASE, BLOOD   ____________________________________________  EKG   ____________________________________________  RADIOLOGY   ____________________________________________   PROCEDURES  Procedure(s) performed: None  Procedures  Critical Care performed: No  ____________________________________________   INITIAL IMPRESSION / ASSESSMENT AND PLAN / ED COURSE  Pertinent labs & imaging results that were available during my care of the patient were reviewed by me and considered in my medical decision making (see chart for details).  Patient reports nausea and vomiting, crampy abdominal discomfort off and on for the last 4 days with loose stools. She is gravid. Labs reassuring, patient reports 2 previous ultrasounds, and close OB follow-up. Do not believe the patient is  at any significant risk for ectopic pregnancy at this point, she is a known pregnancy without any obvious associated complication at this time. I must suspect gastroenteritis, reports husband and multiple family members also sick with similar symptoms at this time for them. No influenza-like symptoms. No cardiac or pulmonary symptoms. The patient reports able take by mouth, reassuring really abdominal examination labs. Afebrile no leukocytosis.    Clinical Course    ----------------------------------------- 8:32 PM on 11/03/2016 -----------------------------------------  Patient reports feeling much improved. Resting comfort only. Urinalysis concerning for possible urinary tract infection, reviewed current treatment recommendations from [facility] and will treat patient with Macrobid twice daily for a week close follow-up with her gynecologist advised. Return precautions and treatment recommendations and follow-up discussed with the patient who is agreeable with the plan.   ____________________________________________   FINAL CLINICAL IMPRESSION(S) / ED DIAGNOSES  Final diagnoses:  Lower urinary tract infection, acute  Pregnancy, unspecified gestational age      NEW MEDICATIONS STARTED DURING THIS VISIT:  New Prescriptions   NITROFURANTOIN, MACROCRYSTAL-MONOHYDRATE, (MACROBID) 100 MG CAPSULE    Take 1 capsule (100 mg  total) by mouth 2 (two) times daily.     Note:  This document was prepared using Dragon voice recognition software and may include unintentional dictation errors.     Sharyn CreamerMark Aveleen Nevers, MD 11/03/16 2032

## 2016-11-06 ENCOUNTER — Other Ambulatory Visit: Payer: Self-pay | Admitting: Obstetrics & Gynecology

## 2016-11-29 ENCOUNTER — Encounter: Payer: Self-pay | Admitting: Emergency Medicine

## 2016-11-29 ENCOUNTER — Telehealth: Payer: Self-pay | Admitting: *Deleted

## 2016-11-29 ENCOUNTER — Encounter: Payer: Self-pay | Admitting: *Deleted

## 2016-11-29 ENCOUNTER — Emergency Department
Admission: EM | Admit: 2016-11-29 | Discharge: 2016-11-29 | Disposition: A | Discharge status: Home / Self Care | Payer: Medicaid Other | Attending: Emergency Medicine | Admitting: Emergency Medicine

## 2016-11-29 ENCOUNTER — Emergency Department: Payer: Medicaid Other

## 2016-11-29 DIAGNOSIS — Z2913 Encounter for prophylactic Rho(D) immune globulin: Secondary | ICD-10-CM | POA: Diagnosis not present

## 2016-11-29 DIAGNOSIS — Z3A17 17 weeks gestation of pregnancy: Secondary | ICD-10-CM | POA: Diagnosis not present

## 2016-11-29 DIAGNOSIS — O209 Hemorrhage in early pregnancy, unspecified: Secondary | ICD-10-CM | POA: Diagnosis present

## 2016-11-29 DIAGNOSIS — O200 Threatened abortion: Secondary | ICD-10-CM | POA: Diagnosis not present

## 2016-11-29 DIAGNOSIS — R109 Unspecified abdominal pain: Secondary | ICD-10-CM | POA: Insufficient documentation

## 2016-11-29 DIAGNOSIS — O360921 Maternal care for other rhesus isoimmunization, second trimester, fetus 1: Secondary | ICD-10-CM | POA: Diagnosis not present

## 2016-11-29 DIAGNOSIS — O43212 Placenta accreta, second trimester: Secondary | ICD-10-CM

## 2016-11-29 DIAGNOSIS — Z3492 Encounter for supervision of normal pregnancy, unspecified, second trimester: Secondary | ICD-10-CM

## 2016-11-29 DIAGNOSIS — O26892 Other specified pregnancy related conditions, second trimester: Secondary | ICD-10-CM

## 2016-11-29 DIAGNOSIS — N939 Abnormal uterine and vaginal bleeding, unspecified: Secondary | ICD-10-CM

## 2016-11-29 DIAGNOSIS — Z6791 Unspecified blood type, Rh negative: Secondary | ICD-10-CM

## 2016-11-29 LAB — URINALYSIS, ROUTINE W REFLEX MICROSCOPIC
BILIRUBIN URINE: NEGATIVE
GLUCOSE, UA: 50 mg/dL — AB
KETONES UR: NEGATIVE mg/dL
Nitrite: NEGATIVE
PH: 6 (ref 5.0–8.0)
PROTEIN: NEGATIVE mg/dL
Specific Gravity, Urine: 1.016 (ref 1.005–1.030)

## 2016-11-29 LAB — ABO/RH: ABO/RH(D): A NEG

## 2016-11-29 LAB — HCG, QUANTITATIVE, PREGNANCY: HCG, BETA CHAIN, QUANT, S: 67785 m[IU]/mL — AB (ref <5)

## 2016-11-29 MED ORDER — RHO D IMMUNE GLOBULIN 1500 UNIT/2ML IJ SOSY
300.0000 ug | PREFILLED_SYRINGE | Freq: Once | INTRAMUSCULAR | Status: AC
  Administered 2016-11-29: 300 ug via INTRAMUSCULAR
  Filled 2016-11-29: qty 2

## 2016-11-29 NOTE — ED Triage Notes (Signed)
Pt in via POV, reports vaginal bleeding (spoting) x 1 day with intermittent abdominal cramping.  Pt denies saturating pad, denies passing any clots.  Pt is [redacted] weeks pregnant, denies any complications thus far.  NAD at this time.

## 2016-11-29 NOTE — Telephone Encounter (Signed)
-----   Message from Lindell SparHeather L Bacon, [place] sent at 11/29/2016  1:00 PM EST ----- Regarding: request a call  Contact: [phone] Requesting that you call her back ASAP, states that she has to be back in court by 2... Needs call back before then please   Thanks..Marland Kitchen

## 2016-11-29 NOTE — ED Notes (Signed)
Per Kenney Housemananya from lab , pt does not need to be banded for type and screen, pt just needs tall lavender blood tube to be sent

## 2016-11-29 NOTE — Discharge Instructions (Signed)
You were seen today for vaginal spotting at about [redacted] weeks gestation.  By definition, if you have vaginal bleeding during pregnancy it is considered a "threatened miscarriage" or "threatened spontaneous abortion".  We included information for you to read.  We performed an ultrasound and provided UA written report as well as a CD with the study on it to take to your prenatal doctor.  The radiologist felt that it is possible you may have a condition called placenta accreta.  Placenta accreta is a potentially serious pregnancy condition that occurs when blood vessels and other parts of the placenta grow too deeply into the uterine wall.  It does not necessarily cause immediate problems for the baby (and your fetus was healthy-appearing on the ultrasound with a good heart rate), but it can lead to complications at the time of delivery.  It is important that you follow-up with your prenatal provider and discuss these results, share with her/him the ultrasound, and discuss how best to manage this condition as your pregnancy develops.  As we discussed, please adhere to the pelvic rest recommendations (nothing goes into the vagina until your OB doctor says it is okay).  Additionally, since you are Rh negative, we gave you Rhogam 300 mcg intramuscular today.  Please be sure to tell your doctor this as well.  Return to the emergency department if you develop new or worsening symptoms that concern you.

## 2016-11-29 NOTE — ED Provider Notes (Signed)
[facility] Emergency Department Provider Note  ____________________________________________   First MD Initiated Contact with Patient 11/29/16 1714     (approximate)  I have reviewed the triage vital signs and the nursing notes.   HISTORY  Chief Complaint Vaginal Bleeding    HPI Hannah Ball is a 26 y.o. female G2 P1 at approx [redacted] weeks gestation who goes to an OB/GYN in [facility] for prenatal care.  She presents for evaluation of acute onset vaginal spotting on multiple occasions earlier today.  She has had persistent intermittent abdominal cramping that she rates as severe although she is in no acute distress at this time.  She states that that has not changed over the last few weeks.  Today, however, is the first time that she noticed blood on the toilet paper when she wiped.  This is occurred several times.  There is not enough blood to use a pad but it is concerning to her because it has not happened before.  She did not see any blood in the toilet or in her urine.She has had no dysuria.  She was treated several weeks ago for urinary tract infection is not having any more symptoms.   Past Medical History:  Diagnosis Date  . Ovarian cyst   . Type A blood, Rh negative     Patient Active Problem List   Diagnosis Date Noted  . Nausea/vomiting in pregnancy 10/26/2016  . Rh negative, antepartum 10/16/2016  . Supervision of normal pregnancy, antepartum 10/10/2016    Past Surgical History:  Procedure Laterality Date  . LAPAROSCOPIC OVARIAN CYSTECTOMY      Prior to Admission medications   Medication Sig Start Date End Date Taking? Authorizing Provider  Doxylamine-Pyridoxine (DICLEGIS PO) Take by mouth.    Historical Provider, MD  Prenatal Vit-Fe Fumarate-FA (PRENATAL VITAMIN PO) Take by mouth.    Historical Provider, MD    Allergies Benadryl [diphenhydramine]; Neosporin plus max st; Latex; and Penicillins  No family history on  file.  Social History Social History  Substance Use Topics  . Smoking status: Never Smoker  . Smokeless tobacco: Never Used  . Alcohol use No    Review of Systems Constitutional: No fever/chills Eyes: No visual changes. ENT: No sore throat. Cardiovascular: Denies chest pain. Respiratory: Denies shortness of breath. Gastrointestinal: Chronic pregnancy related abdominal cramping.  No nausea, no vomiting.  No diarrhea.  No constipation. Genitourinary: Vaginal spotting starting today Musculoskeletal: Negative for back pain. Skin: Negative for rash. Neurological: Negative for headaches, focal weakness or numbness.  10-point ROS otherwise negative.  ____________________________________________   PHYSICAL EXAM:  VITAL SIGNS: ED Triage Vitals [11/29/16 1522]  Enc Vitals Group     BP (!) 100/55     Pulse Rate 99     Resp 16     Temp 98.2 F (36.8 C)     Temp Source Oral     SpO2 100 %     Weight 186 lb (84.4 kg)     Height 5\' 8"  (1.727 m)     Head Circumference      Peak Flow      Pain Score 8     Pain Loc      Pain Edu?      Excl. in GC?     Constitutional: Alert and oriented. Well appearing and in no acute distress. Eyes: Conjunctivae are normal. PERRL. EOMI. Head: Atraumatic. Nose: No congestion/rhinnorhea. Mouth/Throat: Mucous membranes are moist.  Oropharynx non-erythematous. Neck: No stridor.  No meningeal signs.   Cardiovascular: Normal rate, regular rhythm. Good peripheral circulation. Grossly normal heart sounds. Respiratory: Normal respiratory effort.  No retractions. Lungs CTAB. Gastrointestinal: Soft and nontender. No distention.  Genitourinary: Normal external exam.  Physiologic amount of whitish discharge in the vaginal vault.  Cervix is closed with no active bleeding and no cervicitis.  No blood in the vagina.  No tenderness on exam.  Nurse chaperone present throughout exam. Musculoskeletal: No lower extremity tenderness nor edema. No gross deformities  of extremities. Neurologic:  Normal speech and language. No gross focal neurologic deficits are appreciated.  Skin:  Skin is warm, dry and intact. No rash noted. Psychiatric: Mood and affect are normal. Speech and behavior are normal.  ____________________________________________   LABS (all labs ordered are listed, but only abnormal results are displayed)  Labs Reviewed  HCG, QUANTITATIVE, PREGNANCY - Abnormal; Notable for the following:       Result Value   hCG, Beta Chain, [place] Longest 16,109 (*)    All other components within normal limits  URINALYSIS, ROUTINE W REFLEX MICROSCOPIC - Abnormal; Notable for the following:    Color, Urine YELLOW (*)    APPearance HAZY (*)    Glucose, UA 50 (*)    Hgb urine dipstick SMALL (*)    Leukocytes, UA MODERATE (*)    Bacteria, UA RARE (*)    Squamous Epithelial / LPF 6-30 (*)    All other components within normal limits  URINE CULTURE  ABO/RH  ANTIBODY SCREEN  RHOGAM INJECTION  RH IG WORKUP (INCLUDES ABO/RH)   ____________________________________________  EKG  None - EKG not ordered by ED physician ____________________________________________  RADIOLOGY   US Ob Limited > 14 Wks  Result Date: 11/29/2016 CLINICAL DATA:  Vaginal bleeding for 1 day. EXAM: LIMITED OBSTETRIC ULTRASOUND FINDINGS: Number of Fetuses: 1 Heart Rate:  150 bpm Movement: Visible Presentation: Variable Placental Location: Anterior Previa: No Amniotic Fluid (Subjective):  Within normal limits. BPD:  3.55cm 16w  6d MATERNAL FINDINGS: Cervix:  Appears closed. Uterus/Adnexae: There is prominent soft tissue at the maternal surface of the placenta, slightly hypoechoic compared to placental tissue, and markedly hypervascular. Poor definition of a demarcation between the placenta and uterus. This is concerning for possible accreta. IMPRESSION: 1. Single living intrauterine gestation with qualitatively normal fluid volume. 2. Prominent soft tissues at the maternal placental  surface with marked hypervascularity; this raises the question of placenta accreta. 3. These results will be called to the ordering clinician or representative by the Radiologist Assistant, and communication documented in the PACS or zVision Dashboard. This exam is performed on an emergent basis and does not comprehensively evaluate fetal size, dating, or anatomy; follow-up complete OB US should be considered if further fetal assessment is warranted. Electronically Signed   By: Ellery Plunk M.D.   On: 11/29/2016 18:35    ____________________________________________   PROCEDURES  Procedure(s) performed:   Procedures   Critical Care performed: No ____________________________________________   INITIAL IMPRESSION / ASSESSMENT AND PLAN / ED COURSE  Pertinent labs & imaging results that were available during my care of the patient were reviewed by me and considered in my medical decision making (see chart for details).  The patient is well documented as being Rh- and I will proceed with obtaining RhoGAM 300 g intramuscular from the blood bank.  I have explained this process to the patient.  She is in the second trimester so I will not perform a pelvic exam, but we will obtain an ultrasound  to further evaluate the pregnancy.  She is in no acute distress and has normal vital signs at this time.  Awaiting a urine to make sure that she does not have a persistent urinary tract infection.   Clinical Course as of Nov 30 2019  Wed Nov 29, 2016  [id number] Discussed the results of the ultrasound with Dr. Jean RosenthalJackson.  He recommended pelvic rest, pelvic exam in the ED to make sure that the cervix is closed and not actively bleeding, and close outpatient follow-up with the patient's OB/GYN.  I called ultrasound and asked them to make a CD with the ultrasound for the patient take with her to her provider.  I updated the patient and we will proceed shortly with the pelvic exam.  [CF]  1953 Reassuring pelvic  exam with no bleeding, no blood in the vagina, closed cervix, cervicitis, nontender.  Patient has received her RhoGAM.  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    Loleta Roseory Dilan Fullenwider, MD 11/29/16 2021

## 2016-11-29 NOTE — ED Notes (Signed)
Pt c/o vaginal bleeding that started this am, denies any weakness or dizziness. Pt A&Ox4, ambulatory. Pt aprox 17wks preg

## 2016-11-29 NOTE — Telephone Encounter (Signed)
Pt called in stating she has abd cramping, small amt bright red vaginal bleeding, and nausea/vomiting. She has been in court today for custody hearing and been stressed out. Explained to pt that she would need eval but no MD in office today. She would need to report to [facility] for eval. Pt expressed understanding.

## 2016-11-30 LAB — RHOGAM INJECTION: Unit division: 0

## 2016-12-01 LAB — URINE CULTURE: SPECIAL REQUESTS: NORMAL

## 2016-12-01 LAB — ANTIBODY SCREEN: Antibody Screen: NEGATIVE

## 2016-12-07 ENCOUNTER — Ambulatory Visit (HOSPITAL_COMMUNITY)
Admission: RE | Admit: 2016-12-07 | Discharge: 2016-12-07 | Disposition: A | Discharge status: Home / Self Care | Payer: Medicaid Other | Source: Ambulatory Visit | Attending: Obstetrics and Gynecology | Admitting: Obstetrics and Gynecology

## 2016-12-07 DIAGNOSIS — Z3490 Encounter for supervision of normal pregnancy, unspecified, unspecified trimester: Secondary | ICD-10-CM | POA: Diagnosis present

## 2016-12-07 DIAGNOSIS — O360190 Maternal care for anti-D [Rh] antibodies, unspecified trimester, not applicable or unspecified: Secondary | ICD-10-CM

## 2016-12-07 DIAGNOSIS — Z3482 Encounter for supervision of other normal pregnancy, second trimester: Secondary | ICD-10-CM | POA: Diagnosis not present

## 2016-12-07 DIAGNOSIS — Z3A18 18 weeks gestation of pregnancy: Secondary | ICD-10-CM

## 2016-12-12 ENCOUNTER — Other Ambulatory Visit (HOSPITAL_COMMUNITY): Payer: Self-pay | Admitting: Family Medicine

## 2016-12-12 DIAGNOSIS — Z3A18 18 weeks gestation of pregnancy: Secondary | ICD-10-CM

## 2016-12-12 DIAGNOSIS — O360190 Maternal care for anti-D [Rh] antibodies, unspecified trimester, not applicable or unspecified: Secondary | ICD-10-CM

## 2016-12-12 DIAGNOSIS — Z3490 Encounter for supervision of normal pregnancy, unspecified, unspecified trimester: Secondary | ICD-10-CM

## 2016-12-13 ENCOUNTER — Emergency Department
Admission: EM | Admit: 2016-12-13 | Discharge: 2016-12-13 | Disposition: A | Discharge status: Home / Self Care | Payer: Medicaid Other | Attending: Emergency Medicine | Admitting: Emergency Medicine

## 2016-12-13 ENCOUNTER — Encounter: Payer: Self-pay | Admitting: Emergency Medicine

## 2016-12-13 DIAGNOSIS — Z3A20 20 weeks gestation of pregnancy: Secondary | ICD-10-CM | POA: Diagnosis not present

## 2016-12-13 DIAGNOSIS — R197 Diarrhea, unspecified: Secondary | ICD-10-CM | POA: Insufficient documentation

## 2016-12-13 DIAGNOSIS — R6883 Chills (without fever): Secondary | ICD-10-CM | POA: Insufficient documentation

## 2016-12-13 DIAGNOSIS — R0981 Nasal congestion: Secondary | ICD-10-CM | POA: Insufficient documentation

## 2016-12-13 DIAGNOSIS — O212 Late vomiting of pregnancy: Secondary | ICD-10-CM | POA: Diagnosis not present

## 2016-12-13 DIAGNOSIS — R112 Nausea with vomiting, unspecified: Secondary | ICD-10-CM

## 2016-12-13 DIAGNOSIS — O26892 Other specified pregnancy related conditions, second trimester: Secondary | ICD-10-CM | POA: Diagnosis present

## 2016-12-13 LAB — COMPREHENSIVE METABOLIC PANEL
ALK PHOS: 39 U/L (ref 38–126)
ALT: 31 U/L (ref 14–54)
ANION GAP: 9 (ref 5–15)
AST: 30 U/L (ref 15–41)
Albumin: 3.6 g/dL (ref 3.5–5.0)
BILIRUBIN TOTAL: 1.4 mg/dL — AB (ref 0.3–1.2)
BUN: 7 mg/dL (ref 6–20)
CALCIUM: 8.8 mg/dL — AB (ref 8.9–10.3)
CO2: 21 mmol/L — ABNORMAL LOW (ref 22–32)
Chloride: 105 mmol/L (ref 101–111)
Creatinine, Ser: 0.65 mg/dL (ref 0.44–1.00)
GFR calc Af Amer: >60 mL/min (ref >60)
Glucose, Bld: 92 mg/dL (ref 65–99)
POTASSIUM: 3.7 mmol/L (ref 3.5–5.1)
Sodium: 135 mmol/L (ref 135–145)
TOTAL PROTEIN: 7.1 g/dL (ref 6.5–8.1)

## 2016-12-13 LAB — INFLUENZA PANEL BY PCR (TYPE A & B)
INFLAPCR: NEGATIVE
INFLBPCR: NEGATIVE

## 2016-12-13 LAB — CBC
HEMATOCRIT: 42.3 % (ref 35.0–47.0)
HEMOGLOBIN: 14.4 g/dL (ref 12.0–16.0)
MCH: 28.7 pg (ref 26.0–34.0)
MCHC: 34.1 g/dL (ref 32.0–36.0)
MCV: 84.2 fL (ref 80.0–100.0)
Platelets: 121 10*3/uL — ABNORMAL LOW (ref 150–440)
RBC: 5.02 MIL/uL (ref 3.80–5.20)
RDW: 15.5 % — AB (ref 11.5–14.5)
WBC: 7.8 10*3/uL (ref 3.6–11.0)

## 2016-12-13 LAB — LIPASE, BLOOD: Lipase: 16 U/L (ref 11–51)

## 2016-12-13 MED ORDER — METOCLOPRAMIDE HCL 5 MG/ML IJ SOLN
10.0000 mg | Freq: Once | INTRAMUSCULAR | Status: AC
  Administered 2016-12-13: 10 mg via INTRAVENOUS
  Filled 2016-12-13: qty 2

## 2016-12-13 MED ORDER — METOCLOPRAMIDE HCL 10 MG PO TABS: 10.0000 mg | ORAL_TABLET | Freq: Three times a day (TID) | ORAL | 1 refills | Status: DC

## 2016-12-13 MED ORDER — SODIUM CHLORIDE 0.9 % IV BOLUS (SEPSIS)
1000.0000 mL | Freq: Once | INTRAVENOUS | Status: AC
  Administered 2016-12-13: 1000 mL via INTRAVENOUS

## 2016-12-13 NOTE — ED Notes (Signed)
Pt discharged home after verbalizing understanding of discharge instructions; nad noted. 

## 2016-12-13 NOTE — ED Triage Notes (Signed)
Pt from home with n/v/d since last night. Pt states family members have been diagnosed with flu and she is concerned that she might have it. States she has had 4 emesis events today and 2 diarrhea events. Pt also reports runny nose and sneezing x 2 days. Pt alert & oriented with NAD noted.

## 2016-12-13 NOTE — ED Notes (Signed)
Fetal heart rate 156

## 2016-12-13 NOTE — ED Provider Notes (Signed)
[facility] Emergency Department Provider Note  ____________________________________________  Time seen: Approximately 3:07 PM  I have reviewed the triage vital signs and the nursing notes.   HISTORY  Chief Complaint Emesis and Diarrhea   HPI Hannah Ball is a 26 y.o. female G2P1 currently at 5 months GA who presents for flu like symptoms. Patient reports multiple family members have had the flu recently. Since 4 AM this morning she has had body aches, several episodes of nonbloody nonbilious emesis and watery diarrhea. She also has had chills, congestion, and cough. No abdominal pain, no fever, no chest pain or shortness of breath. No vaginal bleeding. Has taken tylenol at home.   Past Medical History:  Diagnosis Date  . Ovarian cyst   . Type A blood, Rh negative     Patient Active Problem List   Diagnosis Date Noted  . Nausea/vomiting in pregnancy 10/26/2016  . Rh negative, antepartum 10/16/2016  . Supervision of normal pregnancy, antepartum 10/10/2016    Past Surgical History:  Procedure Laterality Date  . LAPAROSCOPIC OVARIAN CYSTECTOMY      Prior to Admission medications   Medication Sig Start Date End Date Taking? Authorizing Provider  Doxylamine-Pyridoxine (DICLEGIS PO) Take by mouth.    Historical Provider, MD  metoCLOPramide (REGLAN) 10 MG tablet Take 1 tablet (10 mg total) by mouth 3 (three) times daily with meals. 12/13/16 12/13/17  Nita Sickle, MD  Prenatal Vit-Fe Fumarate-FA (PRENATAL VITAMIN PO) Take by mouth.    Historical Provider, MD    Allergies Benadryl [diphenhydramine]; Neosporin plus max st; Latex; and Penicillins  History reviewed. No pertinent family history.  Social History Social History  Substance Use Topics  . Smoking status: Never Smoker  . Smokeless tobacco: Never Used  . Alcohol use No    Review of Systems  Constitutional: Negative for fever. + chills and body aches Eyes: Negative for visual  changes. ENT: Negative for sore throat. Neck: No neck pain  Cardiovascular: Negative for chest pain. Respiratory: Negative for shortness of breath. + congestion Gastrointestinal: Negative for abdominal pain. + vomiting and diarrhea. Genitourinary: Negative for dysuria. Musculoskeletal: Negative for back pain. Skin: Negative for rash. Neurological: Negative for headaches, weakness or numbness. Psych: No SI or HI  ____________________________________________   PHYSICAL EXAM:  VITAL SIGNS: ED Triage Vitals  Enc Vitals Group     BP 12/13/16 1246 108/71     Pulse Rate 12/13/16 1246 (!) 115     Resp 12/13/16 1246 18     Temp 12/13/16 1246 98 F (36.7 C)     Temp src --      SpO2 12/13/16 1246 100 %     Weight 12/13/16 1246 159 lb (72.1 kg)     Height 12/13/16 1246 5\' 8"  (1.727 m)     Head Circumference --      Peak Flow --      Pain Score 12/13/16 1247 8     Pain Loc --      Pain Edu? --      Excl. in GC? --     Constitutional: Alert and oriented. Well appearing and in no apparent distress. HEENT:      Head: Normocephalic and atraumatic.         Eyes: Conjunctivae are normal. Sclera is non-icteric. EOMI. PERRL      Mouth/Throat: Mucous membranes are moist.       Neck: Supple with no signs of meningismus. Cardiovascular: Tachycardic with regular rhythm. No murmurs, gallops,  or rubs. 2+ symmetrical distal pulses are present in all extremities. No JVD. Respiratory: Normal respiratory effort. Lungs are clear to auscultation bilaterally. No wheezes, crackles, or rhonchi.  Gastrointestinal: Gravid, non tender, and non distended with positive bowel sounds. No rebound or guarding. Genitourinary: No CVA tenderness. Musculoskeletal: Nontender with normal range of motion in all extremities. No edema, cyanosis, or erythema of extremities. Neurologic: Normal speech and language. Face is symmetric. Moving all extremities. No gross focal neurologic deficits are appreciated. Skin: Skin is  warm, dry and intact. No rash noted. Psychiatric: Mood and affect are normal. Speech and behavior are normal.  ____________________________________________   LABS (all labs ordered are listed, but only abnormal results are displayed)  Labs Reviewed  COMPREHENSIVE METABOLIC PANEL - Abnormal; Notable for the following:       Result Value   CO2 21 (*)    Calcium 8.8 (*)    Total Bilirubin 1.4 (*)    All other components within normal limits  CBC - Abnormal; Notable for the following:    RDW 15.5 (*)    Platelets 121 (*)    All other components within normal limits  LIPASE, BLOOD  INFLUENZA PANEL BY PCR (TYPE A & B)  URINALYSIS, COMPLETE (UACMP) WITH MICROSCOPIC   ____________________________________________  EKG  none  ____________________________________________  RADIOLOGY  none  ____________________________________________   PROCEDURES  Procedure(s) performed: None Procedures Critical Care performed:  None ____________________________________________   INITIAL IMPRESSION / ASSESSMENT AND PLAN / ED COURSE  26 y.o. female G2P1 currently at 5 months GA who presents for flu like symptoms since this am. Patient is extremely well appearing, no distress, has mild tachycardia. We'll give IV fluids and Tylenol for body aches. Will give Reglan for nausea or vomiting. We'll check flu swab. Will get fetal heart tones. Labs with no acute findings. Fetal heart tones 153  Clinical Course as of Dec 14 1751  Wed Dec 13, 2016  1748 Flu is negative. Blood work with no acute findings. Patient no longer having vomiting and diarrhea in the emergency room. Heart rate improved with IV fluids. The patient's blood pressure is 92/54 however review of historical data shows that patient's blood pressure is usually in the 90s. Patient remains well appearing and is requesting discharge.   [CV]    Clinical Course User Index [CV] Nita Sicklearolina Sadeel Fiddler, MD    Pertinent labs & imaging results that  were available during my care of the patient were reviewed by me and considered in my medical decision making (see chart for details).    ____________________________________________   FINAL CLINICAL IMPRESSION(S) / ED DIAGNOSES  Final diagnoses:  Nausea vomiting and diarrhea      NEW MEDICATIONS STARTED DURING THIS VISIT:  New Prescriptions   METOCLOPRAMIDE (REGLAN) 10 MG TABLET    Take 1 tablet (10 mg total) by mouth 3 (three) times daily with meals.     Note:  This document was prepared using Dragon voice recognition software and may include unintentional dictation errors.    Nita Sicklearolina Providencia Hottenstein, MD 12/13/16 [phone]

## 2016-12-21 ENCOUNTER — Encounter: Payer: Self-pay | Admitting: Obstetrics and Gynecology

## 2016-12-21 ENCOUNTER — Ambulatory Visit (INDEPENDENT_AMBULATORY_CARE_PROVIDER_SITE_OTHER): Payer: Medicaid Other | Admitting: Obstetrics and Gynecology

## 2016-12-21 VITALS — BP 105/69 | HR 91 | Wt 186.0 lb

## 2016-12-21 DIAGNOSIS — O26899 Other specified pregnancy related conditions, unspecified trimester: Secondary | ICD-10-CM

## 2016-12-21 DIAGNOSIS — Z6791 Unspecified blood type, Rh negative: Secondary | ICD-10-CM

## 2016-12-21 DIAGNOSIS — O283 Abnormal ultrasonic finding on antenatal screening of mother: Secondary | ICD-10-CM

## 2016-12-21 DIAGNOSIS — Z3480 Encounter for supervision of other normal pregnancy, unspecified trimester: Secondary | ICD-10-CM

## 2016-12-21 DIAGNOSIS — Z3482 Encounter for supervision of other normal pregnancy, second trimester: Secondary | ICD-10-CM

## 2016-12-21 NOTE — Progress Notes (Signed)
Prenatal Visit Note Date: 12/21/2016 Clinic: [facility]  Subjective:  Hannah Ball is a 26 y.o. G2P1001 at 4032w4d being seen today for ongoing prenatal care.  She is currently monitored for the following issues for this low-risk pregnancy and has Supervision of normal pregnancy, antepartum; Rh negative, antepartum; and Echogenic intracardiac focus of fetus on prenatal ultrasound on her problem list.  Patient reports no complaints.   Contractions: Not present. Vag. Bleeding: None.  Movement: Present. Denies leaking of fluid.   The following portions of the patient's history were reviewed and updated as appropriate: allergies, current medications, past family history, past medical history, past social history, past surgical history and problem list. Problem list updated.  Objective:   Vitals:   12/21/16 1055  BP: 105/69  Pulse: 91  Weight: 186 lb (84.4 kg)    Fetal Status: Fetal Heart Rate (bpm): 143   Movement: Present     General:  Alert, oriented and cooperative. Patient is in no acute distress.  Skin: Skin is warm and dry. No rash noted.   Cardiovascular: Normal heart rate noted  Respiratory: Normal respiratory effort, no problems with respiration noted  Abdomen: Soft, gravid, appropriate for gestational age. Pain/Pressure: Present     Pelvic:  Cervical exam deferred        Extremities: Normal range of motion.  Edema: Trace  Mental Status: Normal mood and affect. Normal behavior. Normal judgment and thought content.   Urinalysis:      Assessment and Plan:  Pregnancy: G2P1001 at 6532w4d  1. Echogenic intracardiac focus of fetus on prenatal ultrasound S/p neg 1st trimester screen. Seen by MFM and follow but likely incidental finding. AFP today - US MFM OB FOLLOW UP; Future - AFP, Serum, Open Spina Bifida  2. Supervision of other normal pregnancy, antepartum F/u completion anatomy scan ordered  3. Rh negative, antepartum Rhogam at 28wks and pp  PRN  Preterm labor symptoms and general obstetric precautions including but not limited to vaginal bleeding, contractions, leaking of fluid and fetal movement were reviewed in detail with the patient. Please refer to After Visit Summary for other counseling recommendations.  Return in about 4 weeks (around 01/18/2017) for 4wk rob. 2-3wk f/u mfm u/s.   Tangent Bingharlie Jamara Vary, MD

## 2016-12-29 LAB — AFP, SERUM, OPEN SPINA BIFIDA
AFP MoM: 0.88
AFP VALUE AFPOSL: 46.2 ng/mL
GEST. AGE ON COLLECTION DATE: 20.4 wk
MATERNAL AGE AT EDD: 25.6 a
OSBR Risk 1 IN: 10000
Test Results:: NEGATIVE
WEIGHT: 186 [lb_av]

## 2017-01-04 ENCOUNTER — Ambulatory Visit (HOSPITAL_COMMUNITY)
Admission: RE | Admit: 2017-01-04 | Discharge: 2017-01-04 | Disposition: A | Discharge status: Home / Self Care | Payer: Medicaid Other | Source: Ambulatory Visit | Attending: Obstetrics and Gynecology | Admitting: Obstetrics and Gynecology

## 2017-01-04 ENCOUNTER — Other Ambulatory Visit: Payer: Self-pay | Admitting: Obstetrics and Gynecology

## 2017-01-04 DIAGNOSIS — Z3A22 22 weeks gestation of pregnancy: Secondary | ICD-10-CM | POA: Diagnosis not present

## 2017-01-04 DIAGNOSIS — Z362 Encounter for other antenatal screening follow-up: Secondary | ICD-10-CM | POA: Diagnosis not present

## 2017-01-04 DIAGNOSIS — O283 Abnormal ultrasonic finding on antenatal screening of mother: Secondary | ICD-10-CM

## 2017-01-04 DIAGNOSIS — IMO0002 Reserved for concepts with insufficient information to code with codable children: Secondary | ICD-10-CM

## 2017-01-04 DIAGNOSIS — Z0489 Encounter for examination and observation for other specified reasons: Secondary | ICD-10-CM

## 2017-01-07 ENCOUNTER — Encounter: Payer: Self-pay | Admitting: Obstetrics and Gynecology

## 2017-01-07 DIAGNOSIS — O358XX0 Maternal care for other (suspected) fetal abnormality and damage, not applicable or unspecified: Secondary | ICD-10-CM | POA: Insufficient documentation

## 2017-01-07 DIAGNOSIS — O35EXX0 Maternal care for other (suspected) fetal abnormality and damage, fetal genitourinary anomalies, not applicable or unspecified: Secondary | ICD-10-CM | POA: Insufficient documentation

## 2017-01-18 ENCOUNTER — Ambulatory Visit (INDEPENDENT_AMBULATORY_CARE_PROVIDER_SITE_OTHER): Payer: Medicaid Other | Admitting: Obstetrics & Gynecology

## 2017-01-18 VITALS — BP 90/59 | HR 90 | Wt 188.0 lb

## 2017-01-18 DIAGNOSIS — O99210 Obesity complicating pregnancy, unspecified trimester: Secondary | ICD-10-CM

## 2017-01-18 DIAGNOSIS — E669 Obesity, unspecified: Secondary | ICD-10-CM

## 2017-01-18 DIAGNOSIS — Z3480 Encounter for supervision of other normal pregnancy, unspecified trimester: Secondary | ICD-10-CM

## 2017-01-18 DIAGNOSIS — O99212 Obesity complicating pregnancy, second trimester: Secondary | ICD-10-CM

## 2017-01-18 DIAGNOSIS — Z3482 Encounter for supervision of other normal pregnancy, second trimester: Secondary | ICD-10-CM

## 2017-01-18 NOTE — Progress Notes (Signed)
   PRENATAL VISIT NOTE  Subjective:  Hannah Ball is a 26 y.o. G2P1001 at 4083w4d being seen today for ongoing prenatal care.  She is currently monitored for the following issues for this low-risk pregnancy and has Supervision of normal pregnancy, antepartum; Rh negative, antepartum; Echogenic intracardiac focus of fetus on prenatal ultrasound; Encounter for repeat ultrasound of fetal pyelectasis in singleton pregnancy, antepartum; and Obesity in pregnancy on her problem list.  Patient reports no complaints.   .  .   . Denies leaking of fluid.   The following portions of the patient's history were reviewed and updated as appropriate: allergies, current medications, past family history, past medical history, past social history, past surgical history and problem list. Problem list updated.  Objective:  There were no vitals filed for this visit.  Fetal Status:           General:  Alert, oriented and cooperative. Patient is in no acute distress.  Skin: Skin is warm and dry. No rash noted.   Cardiovascular: Normal heart rate noted  Respiratory: Normal respiratory effort, no problems with respiration noted  Abdomen: Soft, gravid, appropriate for gestational age.       Pelvic:  Cervical exam deferred        Extremities: Normal range of motion.     Mental Status: Normal mood and affect. Normal behavior. Normal judgment and thought content.   Assessment and Plan:  Pregnancy: G2P1001 at 6983w4d  1. Supervision of other normal pregnancy, antepartum tdap, labs at next visit  2. Obesity in pregnancy glucola at next visit  Preterm labor symptoms and general obstetric precautions including but not limited to vaginal bleeding, contractions, leaking of fluid and fetal movement were reviewed in detail with the patient. Please refer to After Visit Summary for other counseling recommendations.  No Follow-up on file.   Hannah BossierMyra C Ryzen Deady, MD

## 2017-02-15 ENCOUNTER — Ambulatory Visit (INDEPENDENT_AMBULATORY_CARE_PROVIDER_SITE_OTHER): Payer: Medicaid Other | Admitting: Obstetrics and Gynecology

## 2017-02-15 VITALS — BP 103/66 | HR 106 | Wt 194.0 lb

## 2017-02-15 DIAGNOSIS — Z6791 Unspecified blood type, Rh negative: Secondary | ICD-10-CM

## 2017-02-15 DIAGNOSIS — O09893 Supervision of other high risk pregnancies, third trimester: Secondary | ICD-10-CM | POA: Diagnosis not present

## 2017-02-15 DIAGNOSIS — Z23 Encounter for immunization: Secondary | ICD-10-CM | POA: Diagnosis not present

## 2017-02-15 DIAGNOSIS — O26899 Other specified pregnancy related conditions, unspecified trimester: Secondary | ICD-10-CM

## 2017-02-15 DIAGNOSIS — Z3480 Encounter for supervision of other normal pregnancy, unspecified trimester: Secondary | ICD-10-CM

## 2017-02-15 DIAGNOSIS — Z3483 Encounter for supervision of other normal pregnancy, third trimester: Secondary | ICD-10-CM

## 2017-02-15 DIAGNOSIS — O358XX0 Maternal care for other (suspected) fetal abnormality and damage, not applicable or unspecified: Secondary | ICD-10-CM

## 2017-02-15 DIAGNOSIS — O360930 Maternal care for other rhesus isoimmunization, third trimester, not applicable or unspecified: Secondary | ICD-10-CM

## 2017-02-15 DIAGNOSIS — O35EXX0 Maternal care for other (suspected) fetal abnormality and damage, fetal genitourinary anomalies, not applicable or unspecified: Secondary | ICD-10-CM

## 2017-02-15 MED ORDER — RHO D IMMUNE GLOBULIN 1500 UNIT/2ML IJ SOSY
300.0000 ug | PREFILLED_SYRINGE | Freq: Once | INTRAMUSCULAR | Status: AC
  Administered 2017-02-15: 300 ug via INTRAMUSCULAR

## 2017-02-15 NOTE — Progress Notes (Signed)
Prenatal Visit Note Date: 02/15/2017 Clinic: [facility]  Subjective:  Hannah Ball is a 26 y.o. G2P1001 at [redacted]w[redacted]d being seen today for ongoing prenatal care.  She is currently monitored for the following issues for this low-risk pregnancy and has Supervision of normal pregnancy, antepartum; Rh negative, antepartum; Echogenic intracardiac focus of fetus on prenatal ultrasound; Encounter for repeat ultrasound of fetal pyelectasis in singleton pregnancy, antepartum; and Obesity in pregnancy on her problem list.  Patient reports no complaints.   Contractions: Irritability. Vag. Bleeding: None.  Movement: Present. Denies leaking of fluid.   The following portions of the patient's history were reviewed and updated as appropriate: allergies, current medications, past family history, past medical history, past social history, past surgical history and problem list. Problem list updated.  Objective:   Vitals:   02/15/17 0836  BP: 103/66  Pulse: (!) 106  Weight: 194 lb (88 kg)    Fetal Status: Fetal Heart Rate (bpm): 139 Fundal Height: 28 cm Movement: Present     General:  Alert, oriented and cooperative. Patient is in no acute distress.  Skin: Skin is warm and dry. No rash noted.   Cardiovascular: Normal heart rate noted  Respiratory: Normal respiratory effort, no problems with respiration noted  Abdomen: Soft, gravid, appropriate for gestational age. Pain/Pressure: Present     Pelvic:  Cervical exam deferred        Extremities: Normal range of motion.  Edema: Trace  Mental Status: Normal mood and affect. Normal behavior. Normal judgment and thought content.   Urinalysis:      Assessment and Plan:  Pregnancy: G2P1001 at [redacted]w[redacted]d  1. Encounter for supervision of other normal pregnancy in third trimester Routine care - Glucose Tolerance, 2 Hours w/1 Hour - CBC - RPR - HIV antibody - Tdap vaccine greater than or equal to 7yo IM  2. Rh negative, antepartum Rhogam  today - Antibody screen - ABO AND RH   3. Encounter for repeat ultrasound of fetal pyelectasis in singleton pregnancy, antepartum In 2-3wks - [place] MFM OB FOLLOW UP; Future  Preterm labor symptoms and general obstetric precautions including but not limited to vaginal bleeding, contractions, leaking of fluid and fetal movement were reviewed in detail with the patient. Please refer to After Visit Summary for other counseling recommendations.  Return in about 2 weeks (around 03/01/2017).   Enfield Bing, MD

## 2017-02-15 NOTE — Addendum Note (Signed)
Addended by: Gita Kudo on: 02/15/2017 09:03 AM   Modules accepted: Orders

## 2017-02-16 ENCOUNTER — Encounter: Payer: Self-pay | Admitting: *Deleted

## 2017-02-16 LAB — CBC
Hematocrit: 36.8 % (ref 34.0–46.6)
Hemoglobin: 12.3 g/dL (ref 11.1–15.9)
MCH: 28 pg (ref 26.6–33.0)
MCHC: 33.4 g/dL (ref 31.5–35.7)
MCV: 84 fL (ref 79–97)
Platelets: 168 10*3/uL (ref 150–379)
RBC: 4.4 x10E6/uL (ref 3.77–5.28)
RDW: 13.2 % (ref 12.3–15.4)
WBC: 8.3 10*3/uL (ref 3.4–10.8)

## 2017-02-16 LAB — ABO AND RH: Rh Factor: NEGATIVE

## 2017-02-16 LAB — GLUCOSE TOLERANCE, 2 HOURS W/ 1HR
GLUCOSE, 1 HOUR: 76 mg/dL (ref 65–179)
GLUCOSE, 2 HOUR: 73 mg/dL (ref 65–152)
GLUCOSE, FASTING: 72 mg/dL (ref 65–91)

## 2017-02-16 LAB — RPR: RPR: NONREACTIVE

## 2017-02-16 LAB — HIV ANTIBODY (ROUTINE TESTING W REFLEX): HIV SCREEN 4TH GENERATION: NONREACTIVE

## 2017-02-16 LAB — ANTIBODY SCREEN: Antibody Screen: NEGATIVE

## 2017-03-01 ENCOUNTER — Encounter: Payer: Self-pay | Admitting: Obstetrics and Gynecology

## 2017-03-01 ENCOUNTER — Ambulatory Visit (INDEPENDENT_AMBULATORY_CARE_PROVIDER_SITE_OTHER): Payer: Medicaid Other | Admitting: Obstetrics and Gynecology

## 2017-03-01 VITALS — BP 101/64 | HR 96 | Wt 193.4 lb

## 2017-03-01 DIAGNOSIS — O99119 Other diseases of the blood and blood-forming organs and certain disorders involving the immune mechanism complicating pregnancy, unspecified trimester: Secondary | ICD-10-CM

## 2017-03-01 DIAGNOSIS — D696 Thrombocytopenia, unspecified: Secondary | ICD-10-CM

## 2017-03-01 DIAGNOSIS — O99210 Obesity complicating pregnancy, unspecified trimester: Secondary | ICD-10-CM

## 2017-03-01 DIAGNOSIS — O99213 Obesity complicating pregnancy, third trimester: Secondary | ICD-10-CM

## 2017-03-01 DIAGNOSIS — Z3480 Encounter for supervision of other normal pregnancy, unspecified trimester: Secondary | ICD-10-CM

## 2017-03-01 DIAGNOSIS — O358XX0 Maternal care for other (suspected) fetal abnormality and damage, not applicable or unspecified: Secondary | ICD-10-CM

## 2017-03-01 DIAGNOSIS — Z6791 Unspecified blood type, Rh negative: Secondary | ICD-10-CM

## 2017-03-01 DIAGNOSIS — O26899 Other specified pregnancy related conditions, unspecified trimester: Secondary | ICD-10-CM

## 2017-03-01 DIAGNOSIS — O99113 Other diseases of the blood and blood-forming organs and certain disorders involving the immune mechanism complicating pregnancy, third trimester: Secondary | ICD-10-CM

## 2017-03-01 DIAGNOSIS — O35EXX0 Maternal care for other (suspected) fetal abnormality and damage, fetal genitourinary anomalies, not applicable or unspecified: Secondary | ICD-10-CM

## 2017-03-01 DIAGNOSIS — O09893 Supervision of other high risk pregnancies, third trimester: Secondary | ICD-10-CM

## 2017-03-01 MED ORDER — RANITIDINE HCL 150 MG PO CAPS: 150.0000 mg | ORAL_CAPSULE | Freq: Two times a day (BID) | ORAL | 4 refills | Status: DC

## 2017-03-01 NOTE — Progress Notes (Signed)
Prenatal Visit Note Date: 03/01/2017 Clinic: [facility]  Subjective:  Hannah Ball is a 26 y.o. G2P1001 at 143w4d being seen today for ongoing prenatal care.  She is currently monitored for the following issues for this high-risk pregnancy and has Supervision of normal pregnancy, antepartum; Rh negative, antepartum; Echogenic intracardiac focus of fetus on prenatal ultrasound; Encounter for repeat ultrasound of fetal pyelectasis in singleton pregnancy, antepartum; Obesity in pregnancy; and Gestational thrombocytopenia (HCC) on her problem list.  Patient reports GERD.   Contractions: Irritability. Vag. Bleeding: None.  Movement: Present. Denies leaking of fluid.   The following portions of the patient's history were reviewed and updated as appropriate: allergies, current medications, past family history, past medical history, past social history, past surgical history and problem list. Problem list updated.  Objective:   Vitals:   03/01/17 0929  BP: 101/64  Pulse: 96  Weight: 193 lb 6.4 oz (87.7 kg)    Fetal Status: Fetal Heart Rate (bpm): 145 Fundal Height: 30 cm Movement: Present     General:  Alert, oriented and cooperative. Patient is in no acute distress.  Skin: Skin is warm and dry. No rash noted.   Cardiovascular: Normal heart rate noted  Respiratory: Normal respiratory effort, no problems with respiration noted  Abdomen: Soft, gravid, appropriate for gestational age. Pain/Pressure: Present     Pelvic:  Cervical exam deferred        Extremities: Normal range of motion.  Edema: Trace  Mental Status: Normal mood and affect. Normal behavior. Normal judgment and thought content.   Urinalysis:      Assessment and Plan:  Pregnancy: G2P1001 at 5743w4d  1. Supervision of other normal pregnancy, antepartum Routine care. behav methods and zantac for GERD  2. Rh negative, antepartum s/p rhogam already  3. Obesity in pregnancy No change in plan of care  4.  Encounter for repeat ultrasound of fetal pyelectasis in singleton pregnancy, antepartum Has rpt u/s scheduled for 2wks  5. Benign gestational thrombocytopenia in third trimester (HCC) Rpt at 34-36wks. Normal at 28wks.   Preterm labor symptoms and general obstetric precautions including but not limited to vaginal bleeding, contractions, leaking of fluid and fetal movement were reviewed in detail with the patient. Please refer to After Visit Summary for other counseling recommendations.  Return in about 4 weeks (around 03/29/2017).per baby scripts   [place] Bingharlie Liese Dizdarevic, MD

## 2017-03-09 ENCOUNTER — Ambulatory Visit (HOSPITAL_COMMUNITY)
Admission: RE | Admit: 2017-03-09 | Discharge: 2017-03-09 | Disposition: A | Discharge status: Home / Self Care | Payer: Medicaid Other | Source: Ambulatory Visit | Attending: Obstetrics and Gynecology | Admitting: Obstetrics and Gynecology

## 2017-03-09 DIAGNOSIS — O358XX0 Maternal care for other (suspected) fetal abnormality and damage, not applicable or unspecified: Secondary | ICD-10-CM | POA: Diagnosis not present

## 2017-03-09 DIAGNOSIS — O35EXX0 Maternal care for other (suspected) fetal abnormality and damage, fetal genitourinary anomalies, not applicable or unspecified: Secondary | ICD-10-CM

## 2017-03-09 DIAGNOSIS — Z3A31 31 weeks gestation of pregnancy: Secondary | ICD-10-CM | POA: Diagnosis not present

## 2017-03-14 ENCOUNTER — Ambulatory Visit (INDEPENDENT_AMBULATORY_CARE_PROVIDER_SITE_OTHER): Payer: Medicaid Other | Admitting: Family Medicine

## 2017-03-14 VITALS — BP 100/66 | HR 92 | Wt 197.0 lb

## 2017-03-14 DIAGNOSIS — O99210 Obesity complicating pregnancy, unspecified trimester: Secondary | ICD-10-CM

## 2017-03-14 DIAGNOSIS — O35EXX0 Maternal care for other (suspected) fetal abnormality and damage, fetal genitourinary anomalies, not applicable or unspecified: Secondary | ICD-10-CM

## 2017-03-14 DIAGNOSIS — Z3480 Encounter for supervision of other normal pregnancy, unspecified trimester: Secondary | ICD-10-CM

## 2017-03-14 DIAGNOSIS — O09893 Supervision of other high risk pregnancies, third trimester: Secondary | ICD-10-CM

## 2017-03-14 DIAGNOSIS — O358XX0 Maternal care for other (suspected) fetal abnormality and damage, not applicable or unspecified: Secondary | ICD-10-CM

## 2017-03-14 DIAGNOSIS — D696 Thrombocytopenia, unspecified: Secondary | ICD-10-CM

## 2017-03-14 DIAGNOSIS — O26899 Other specified pregnancy related conditions, unspecified trimester: Secondary | ICD-10-CM

## 2017-03-14 DIAGNOSIS — O99213 Obesity complicating pregnancy, third trimester: Secondary | ICD-10-CM

## 2017-03-14 DIAGNOSIS — Z6791 Unspecified blood type, Rh negative: Secondary | ICD-10-CM

## 2017-03-14 DIAGNOSIS — E669 Obesity, unspecified: Secondary | ICD-10-CM

## 2017-03-14 DIAGNOSIS — O99113 Other diseases of the blood and blood-forming organs and certain disorders involving the immune mechanism complicating pregnancy, third trimester: Secondary | ICD-10-CM

## 2017-03-14 NOTE — Progress Notes (Signed)
   PRENATAL VISIT NOTE  Subjective:  Hannah Ball is a 26 y.o. G2P1001 at 2870w3d being seen today for ongoing prenatal care.  She is currently monitored for the following issues for this low-risk pregnancy and has Supervision of normal pregnancy, antepartum; Rh negative, antepartum; Echogenic intracardiac focus of fetus on prenatal ultrasound; Encounter for repeat ultrasound of fetal pyelectasis in singleton pregnancy, antepartum; Obesity in pregnancy; and Gestational thrombocytopenia (HCC) on her problem list.  Patient reports no complaints.  Contractions: Irritability. Vag. Bleeding: None.  Movement: Present. Denies leaking of fluid.   The following portions of the patient's history were reviewed and updated as appropriate: allergies, current medications, past family history, past medical history, past social history, past surgical history and problem list. Problem list updated.  Objective:   Vitals:   03/14/17 1349  BP: 100/66  Pulse: 92  Weight: 197 lb (89.4 kg)    Fetal Status: Fetal Heart Rate (bpm): 159   Movement: Present     General:  Alert, oriented and cooperative. Patient is in no acute distress.  Skin: Skin is warm and dry. No rash noted.   Cardiovascular: Normal heart rate noted  Respiratory: Normal respiratory effort, no problems with respiration noted  Abdomen: Soft, gravid, appropriate for gestational age. Pain/Pressure: Present     Pelvic:  Cervical exam deferred        Extremities: Normal range of motion.  Edema: Trace  Mental Status: Normal mood and affect. Normal behavior. Normal judgment and thought content.   Assessment and Plan:  Pregnancy: G2P1001 at 7570w3d  1. Obesity in pregnancy Weight gain is approrpiate, only 7# weight gain so far  2. Rh negative, antepartum Received rhogam  3. Supervision of other normal pregnancy, antepartum UTD Reviewed US results- intracardiac focus still present. Pyelectasis resolved  4. Benign gestational  thrombocytopenia in third trimester (HCC) Recheck at next visit  5. Encounter for repeat ultrasound of fetal pyelectasis in singleton pregnancy, antepartum -resolved on most recent US  Preterm labor symptoms and general obstetric precautions including but not limited to vaginal bleeding, contractions, leaking of fluid and fetal movement were reviewed in detail with the patient. Please refer to After Visit Summary for other counseling recommendations.  Return in about 2 weeks (around 03/28/2017) for Routine prenatal care.   Federico FlakeNewton, Kriste Broman Niles, MD

## 2017-03-16 ENCOUNTER — Encounter: Payer: Medicaid Other | Admitting: Obstetrics and Gynecology

## 2017-03-23 ENCOUNTER — Observation Stay
Admission: EM | Admit: 2017-03-23 | Discharge: 2017-03-23 | Disposition: A | Discharge status: Home / Self Care | Payer: Medicaid Other | Attending: Obstetrics and Gynecology | Admitting: Obstetrics and Gynecology

## 2017-03-23 ENCOUNTER — Encounter: Payer: Self-pay | Admitting: *Deleted

## 2017-03-23 DIAGNOSIS — R109 Unspecified abdominal pain: Secondary | ICD-10-CM | POA: Diagnosis not present

## 2017-03-23 DIAGNOSIS — Z3480 Encounter for supervision of other normal pregnancy, unspecified trimester: Secondary | ICD-10-CM

## 2017-03-23 DIAGNOSIS — O26893 Other specified pregnancy related conditions, third trimester: Secondary | ICD-10-CM | POA: Diagnosis not present

## 2017-03-23 DIAGNOSIS — Z91040 Latex allergy status: Secondary | ICD-10-CM | POA: Diagnosis not present

## 2017-03-23 DIAGNOSIS — N760 Acute vaginitis: Secondary | ICD-10-CM | POA: Diagnosis present

## 2017-03-23 DIAGNOSIS — O26899 Other specified pregnancy related conditions, unspecified trimester: Secondary | ICD-10-CM

## 2017-03-23 DIAGNOSIS — R1010 Upper abdominal pain, unspecified: Secondary | ICD-10-CM | POA: Diagnosis not present

## 2017-03-23 DIAGNOSIS — Z880 Allergy status to penicillin: Secondary | ICD-10-CM | POA: Insufficient documentation

## 2017-03-23 DIAGNOSIS — Z3A33 33 weeks gestation of pregnancy: Secondary | ICD-10-CM | POA: Diagnosis not present

## 2017-03-23 DIAGNOSIS — Z6791 Unspecified blood type, Rh negative: Secondary | ICD-10-CM

## 2017-03-23 DIAGNOSIS — O99210 Obesity complicating pregnancy, unspecified trimester: Secondary | ICD-10-CM

## 2017-03-23 DIAGNOSIS — B9689 Other specified bacterial agents as the cause of diseases classified elsewhere: Secondary | ICD-10-CM | POA: Diagnosis present

## 2017-03-23 LAB — CBC
HCT: 32.2 % — ABNORMAL LOW (ref 35.0–47.0)
Hemoglobin: 10.6 g/dL — ABNORMAL LOW (ref 12.0–16.0)
MCH: 25.9 pg — AB (ref 26.0–34.0)
MCHC: 33 g/dL (ref 32.0–36.0)
MCV: 78.4 fL — AB (ref 80.0–100.0)
PLATELETS: 172 10*3/uL (ref 150–440)
RBC: 4.1 MIL/uL (ref 3.80–5.20)
RDW: 14.1 % (ref 11.5–14.5)
WBC: 7.1 10*3/uL (ref 3.6–11.0)

## 2017-03-23 LAB — COMPREHENSIVE METABOLIC PANEL
ALT: 9 U/L — ABNORMAL LOW (ref 14–54)
ANION GAP: 5 (ref 5–15)
AST: 18 U/L (ref 15–41)
Albumin: 2.8 g/dL — ABNORMAL LOW (ref 3.5–5.0)
Alkaline Phosphatase: 97 U/L (ref 38–126)
BILIRUBIN TOTAL: 0.9 mg/dL (ref 0.3–1.2)
BUN: 6 mg/dL (ref 6–20)
CHLORIDE: 107 mmol/L (ref 101–111)
CO2: 21 mmol/L — ABNORMAL LOW (ref 22–32)
Calcium: 8.5 mg/dL — ABNORMAL LOW (ref 8.9–10.3)
Creatinine, Ser: 0.45 mg/dL (ref 0.44–1.00)
Glucose, Bld: 85 mg/dL (ref 65–99)
POTASSIUM: 3.6 mmol/L (ref 3.5–5.1)
Sodium: 133 mmol/L — ABNORMAL LOW (ref 135–145)
TOTAL PROTEIN: 6.2 g/dL — AB (ref 6.5–8.1)

## 2017-03-23 LAB — URINALYSIS, COMPLETE (UACMP) WITH MICROSCOPIC
BILIRUBIN URINE: NEGATIVE
Glucose, UA: 50 mg/dL — AB
Ketones, ur: NEGATIVE mg/dL
NITRITE: NEGATIVE
Protein, ur: 30 mg/dL — AB
SPECIFIC GRAVITY, URINE: 1.021 (ref 1.005–1.030)
pH: 5 (ref 5.0–8.0)

## 2017-03-23 LAB — LIPASE, BLOOD: LIPASE: 25 U/L (ref 11–51)

## 2017-03-23 LAB — WET PREP, GENITAL
Sperm: NONE SEEN
Trich, Wet Prep: NONE SEEN
YEAST WET PREP: NONE SEEN

## 2017-03-23 LAB — CHLAMYDIA/NGC RT PCR (ARMC ONLY)
Chlamydia Tr: NOT DETECTED
N gonorrhoeae: NOT DETECTED

## 2017-03-23 MED ORDER — METRONIDAZOLE 500 MG PO TABS: 500.0000 mg | ORAL_TABLET | Freq: Two times a day (BID) | ORAL | 0 refills | Status: AC

## 2017-03-23 NOTE — Discharge Instructions (Signed)
Abdominal Pain During Pregnancy  Abdominal pain is common in pregnancy. Most of the time, it does not cause harm. There are many causes of abdominal pain. Some causes are more serious than others and sometimes the cause is not known. Abdominal pain can be a sign that something is very wrong with the pregnancy or the pain may have nothing to do with the pregnancy. Always tell your health care provider if you have any abdominal pain.  Follow these instructions at home:  · Do not have sex or put anything in your vagina until your symptoms go away completely.  · Watch your abdominal pain for any changes.  · Get plenty of rest until your pain improves.  · Drink enough fluid to keep your urine clear or pale yellow.  · Take over-the-counter or prescription medicines only as told by your health care provider.  · Keep all follow-up visits as told by your health care provider. This is important.  Contact a health care provider if:  · You have a fever.  · Your pain gets worse or you have cramping.  · Your pain continues after resting.  Get help right away if:  · You are bleeding, leaking fluid, or passing tissue from the vagina.  · You have vomiting or diarrhea that does not go away.  · You have painful or bloody urination.  · You notice a decrease in your baby's movements.  · You feel very weak or faint.  · You have shortness of breath.  · You develop a severe headache with abdominal pain.  · You have abnormal vaginal discharge with abdominal pain.  This information is not intended to replace advice given to you by your health care provider. Make sure you discuss any questions you have with your health care provider.  Document Released: 10/16/2005 Document Revised: 07/27/2016 Document Reviewed: 05/15/2013  Elsevier Interactive Patient Education © 2017 [employer]

## 2017-03-23 NOTE — Final Progress Note (Signed)
Physician Final Progress Note  Patient ID: Hannah Ball MRN: [id number] DOB/AGE: [birth year] 26 y.o.  Admit date: 03/23/2017 Admitting provider: Conard Novak, MD Discharge date: 03/23/2017   Admission Diagnoses:  1) intrauterine pregnancy at [redacted]w[redacted]d  2) abdominal pain in pregnancy third trimester  Discharge Diagnoses:  1) intrauterine pregnancy at [redacted]w[redacted]d  2) abdominal pain in pregnancy third trimester  History of Present Illness: The patient is a 26 y.o. female G2P1001 at [redacted]w[redacted]d who presents for lower abdominal and right sided-abdominal pain.  The pain has been present to some degree for the past week. However, it woke her from sleep this morning.  The pain comes and goes. It is described as crampy and sharp.  At worst, the pain is 9/10.  Nothing seems to make the pain better. Nothing makes the pain worse.  She denies urinary symptoms of dysuria, frequency, hematuria.  She denies vaginal symptoms of itching, burning, irritation, vaginal bleeding, leakage of fluid.  She denies GI symptoms of diarrhea, hematochezia, melena.  Denies fevers, chills, emesis.  Denies a precipitating event.   She notes good fetal movement.  Pregnancy complicated by obesity, thrombocytopenia, fetal echogenic intracardiac focus, Rh negative status.  Hospital Course: Patient admitted for above.  Evaluated for labor and causes for abdominal pain.  UA was equivocal and similar to previous ones performed recently.  Gonorrhea/chlamydia negative. Wet prep suggestive of bacterial vaginosis. Fetal antepartum testing reassuring.  Labs for abdominal pain normal (lipase, LFTs).  Patient does not have an elevated WBC count.  Her pain is inconsistent with appendicitis.  Her cervix closed and she is not contracting on tocometry.  She is discharged with precautions for appendicitis and worsening abdominal pain.  She was also given pre-term labor precautions.  She was discharged in stable condition with follow up in one week at [facility].   Past Medical History:  Diagnosis Date  . Nausea/vomiting in pregnancy 10/26/2016   Trial of diclegis   . Ovarian cyst   . Type A blood, Rh negative     Past Surgical History:  Procedure Laterality Date  . LAPAROSCOPIC OVARIAN CYSTECTOMY      No current facility-administered medications on file prior to encounter.    Current Outpatient Prescriptions on File Prior to Encounter  Medication Sig Dispense Refill  . Prenat-FeFum-FePo-FA-DHA w/o A (PROVIDA DHA) 16-16-1.25-110 MG CAPS Take 1 tablet by mouth daily.    . ranitidine (ZANTAC) 150 MG capsule Take 1 capsule (150 mg total) by mouth 2 (two) times daily. 60 capsule 4    Allergies  Allergen Reactions  . Benadryl [Diphenhydramine] Hives  . Neosporin Plus Max St Other (See Comments)    Skin burns  . Latex Rash  . Penicillins Rash    Has patient had a PCN reaction causing immediate rash, facial/tongue/throat swelling, SOB or lightheadedness with hypotension: Yes Has patient had a PCN reaction causing severe rash involving mucus membranes or skin necrosis: No Has patient had a PCN reaction that required hospitalization No Has patient had a PCN reaction occurring within the last 10 years: No If all of the above answers are "NO", then may proceed with Cephalosporin use.     Social History   Social History  . Marital status: Married    Spouse name: N/A  . Number of children: N/A  . Years of education: N/A   Occupational History  . Not on file.   Social History Main Topics  . Smoking status: Never Smoker  . Smokeless tobacco: Never  Used  . Alcohol use No  . Drug use: No  . Sexual activity: Yes    Partners: Male    Birth control/ protection: None   Other Topics Concern  . Not on file   Social History Narrative  . No narrative on file   Family History: denies gyn cancers.    Physical Exam: BP 110/67 (BP Location: Right Arm)   Pulse (!) 118   Temp 98.2 F (36.8 C) (Oral)   Resp 16   Ht 5\' 8"  (1.727  m)   Wt 194 lb (88 kg)   LMP 07/30/2016   BMI 29.50 kg/m   Physical Exam  Constitutional: She is oriented to person, place, and time and well-developed, well-nourished, and in no distress. No distress.  HENT:  Head: Normocephalic and atraumatic.  Eyes: Conjunctivae are normal. No scleral icterus.  Cardiovascular: Normal rate and regular rhythm.   No murmur heard. Pulmonary/Chest: Effort normal and breath sounds normal. No respiratory distress. She has no wheezes.  Abdominal:  Soft, gravid, mild ttp in lower abdomen, right lower quadrant, no epigastric pain, no ruq pain, negative murphy's sign. No cvat.   Genitourinary:  Genitourinary Comments: Normal external female genitalia Vagina is pink and normally rugated without lesions or abnormal discharge Cervix appears closed without lesions, bleeding. No CMT.   CVX; closed/thick/high   Musculoskeletal: Normal range of motion. She exhibits no edema.  Neurological: She is alert and oriented to person, place, and time. No cranial nerve deficit.  Psychiatric: Mood, affect and judgment normal.    Female chaperone present during pelvic exam.  Consults: None  Significant Findings/ Diagnostic Studies:  Lab Results  Component Value Date   APPEARANCEUR CLOUDY (A) 03/23/2017   GLUCOSEU 50 (A) 03/23/2017   BILIRUBINUR NEGATIVE 03/23/2017   KETONESUR NEGATIVE 03/23/2017   LABSPEC 1.021 03/23/2017   HGBUR SMALL (A) 03/23/2017   PHURINE 5.0 03/23/2017   NITRITE NEGATIVE 03/23/2017   LEUKOCYTESUR TRACE (A) 03/23/2017   RBCU 6-30 03/23/2017   WBCU 0-5 03/23/2017   BACTERIA RARE (A) 03/23/2017   EPIU 6-30 (A) 03/23/2017   MUCOUSUACOMP PRESENT 03/23/2017   CAOXCRYSTALS PRESENT 03/23/2017    Lab Results  Component Value Date   TRICHWETPREP NONE SEEN 03/23/2017   CLUECELLS PRESENT (A) 03/23/2017   WBCWETPREP MANY (A) 03/23/2017   YEASTWETPREP NONE SEEN 03/23/2017    Lab Results  Component Value Date   CHLAMYDIA NOT DETECTED 03/23/2017    NGONORRHOEAE NOT DETECTED 03/23/2017    Procedures: NST Baseline FHR: 130 beats/min Variability: moderate Accelerations: present Decelerations: absent Tocometry: irritabilty  Interpretation:  INDICATIONS: abdominal pain RESULTS:  A NST procedure was performed with FHR monitoring and a normal baseline established, appropriate time of 20-40 minutes of evaluation, and accels >2 seen w 15x15 characteristics.  Results show a REACTIVE NST.    Discharge Condition: stable  Disposition: 01-Home or Self Care  Diet: Regular diet  Discharge Activity: Activity as tolerated   Allergies as of 03/23/2017      Reactions   Benadryl [diphenhydramine] Hives   Neosporin Plus Max St Other (See Comments)   Skin burns   Latex Rash   Penicillins Rash   Has patient had a PCN reaction causing immediate rash, facial/tongue/throat swelling, SOB or lightheadedness with hypotension: Yes Has patient had a PCN reaction causing severe rash involving mucus membranes or skin necrosis: No Has patient had a PCN reaction that required hospitalization No Has patient had a PCN reaction occurring within the last 10 years: No If  all of the above answers are "NO", then may proceed with Cephalosporin use.      Medication List    TAKE these medications   metroNIDAZOLE 500 MG tablet Commonly known as:  FLAGYL Take 1 tablet (500 mg total) by mouth 2 (two) times daily.   PROVIDA DHA 16-16-1.25-110 MG Caps Take 1 tablet by mouth daily.   ranitidine 150 MG capsule Commonly known as:  ZANTAC Take 1 capsule (150 mg total) by mouth 2 (two) times daily.      Total time spent taking care of this patient: 30 minutes  Signed: Thomasene Mohair, MD  03/23/2017, 6:30 PM

## 2017-03-23 NOTE — OB Triage Note (Signed)
Recvd to OBS 2 with c/o abdominal pain.  Changed to gown and to bed.  EFM applied.  Plan of care discussed and oriented to room.  Agrees with plan of care and verbalizes understanding.

## 2017-03-23 NOTE — Progress Notes (Signed)
Discharge instructions given and explained.  Questions answered.  Verbalized understanding.  Signed copy on chart.

## 2017-03-25 LAB — URINE CULTURE: Special Requests: NORMAL

## 2017-03-29 ENCOUNTER — Ambulatory Visit (INDEPENDENT_AMBULATORY_CARE_PROVIDER_SITE_OTHER): Payer: Medicaid Other | Admitting: Obstetrics and Gynecology

## 2017-03-29 VITALS — BP 112/70 | HR 102 | Wt 198.0 lb

## 2017-03-29 DIAGNOSIS — O99213 Obesity complicating pregnancy, third trimester: Secondary | ICD-10-CM

## 2017-03-29 DIAGNOSIS — D696 Thrombocytopenia, unspecified: Secondary | ICD-10-CM

## 2017-03-29 DIAGNOSIS — Z3480 Encounter for supervision of other normal pregnancy, unspecified trimester: Secondary | ICD-10-CM

## 2017-03-29 DIAGNOSIS — Z6791 Unspecified blood type, Rh negative: Secondary | ICD-10-CM

## 2017-03-29 DIAGNOSIS — O99210 Obesity complicating pregnancy, unspecified trimester: Secondary | ICD-10-CM

## 2017-03-29 DIAGNOSIS — O09893 Supervision of other high risk pregnancies, third trimester: Secondary | ICD-10-CM

## 2017-03-29 DIAGNOSIS — E669 Obesity, unspecified: Secondary | ICD-10-CM

## 2017-03-29 DIAGNOSIS — O99113 Other diseases of the blood and blood-forming organs and certain disorders involving the immune mechanism complicating pregnancy, third trimester: Secondary | ICD-10-CM

## 2017-03-29 DIAGNOSIS — O26899 Other specified pregnancy related conditions, unspecified trimester: Secondary | ICD-10-CM

## 2017-03-29 NOTE — Progress Notes (Signed)
Prenatal Visit Note Date: 03/29/2017 [facility]  Subjective:  Hannah Ball is a 26 y.o. G2P1001 at 1526w4d being seen today for ongoing prenatal care.  She is currently monitored for the following issues for this low-risk pregnancy and has Supervision of normal pregnancy, antepartum; Rh negative, antepartum; Echogenic intracardiac focus of fetus on prenatal ultrasound; Encounter for repeat ultrasound of fetal pyelectasis in singleton pregnancy, antepartum; Obesity in pregnancy; Gestational thrombocytopenia (HCC); and Abdominal pain during pregnancy in third trimester on her problem list.  Patient reports no complaints.   Contractions: Irregular. Vag. Bleeding: None.  Movement: Present. Denies leaking of fluid.   The following portions of the patient's history were reviewed and updated as appropriate: allergies, current medications, past family history, past medical history, past social history, past surgical history and problem list. Problem list updated.  Objective:   Vitals:   03/29/17 0844  BP: 112/70  Pulse: (!) 102  Weight: 198 lb (89.8 kg)    Fetal Status: Fetal Heart Rate (bpm): 135 Fundal Height: 34 cm Movement: Present  Presentation: Vertex  General:  Alert, oriented and cooperative. Patient is in no acute distress.  Skin: Skin is warm and dry. No rash noted.   Cardiovascular: Normal heart rate noted  Respiratory: Normal respiratory effort, no problems with respiration noted  Abdomen: Soft, gravid, appropriate for gestational age. Pain/Pressure: Present     Pelvic:  Cervical exam deferred        Extremities: Normal range of motion.  Edema: Trace  Mental Status: Normal mood and affect. Normal behavior. Normal judgment and thought content.   Urinalysis:      Assessment and Plan:  Pregnancy: G2P1001 at 426w4d  1. Supervision of other normal pregnancy, antepartum Routine care. Belly binder advised. D/w her s/s or right sided sharp  pains occasionally is normal  2. Benign gestational thrombocytopenia in third trimester (HCC) Rpt CBC at 37-40wks, normal at triage eval  3. Rh negative, antepartum S/p rhogam already   Preterm labor symptoms and general obstetric precautions including but not limited to vaginal bleeding, contractions, leaking of fluid and fetal movement were reviewed in detail with the patient. Please refer to After Visit Summary for other counseling recommendations.  Return in about 2 weeks (around 04/12/2017).   [place] BingPickens, Laban Orourke, MD

## 2017-04-06 ENCOUNTER — Ambulatory Visit (INDEPENDENT_AMBULATORY_CARE_PROVIDER_SITE_OTHER): Payer: Medicaid Other | Admitting: Obstetrics and Gynecology

## 2017-04-06 ENCOUNTER — Other Ambulatory Visit (HOSPITAL_COMMUNITY)
Admission: RE | Admit: 2017-04-06 | Discharge: 2017-04-06 | Disposition: A | Discharge status: Home / Self Care | Payer: Medicaid Other | Source: Ambulatory Visit | Attending: Obstetrics and Gynecology | Admitting: Obstetrics and Gynecology

## 2017-04-06 VITALS — BP 103/67 | HR 92 | Wt 199.0 lb

## 2017-04-06 DIAGNOSIS — Z3480 Encounter for supervision of other normal pregnancy, unspecified trimester: Secondary | ICD-10-CM

## 2017-04-06 DIAGNOSIS — Z6791 Unspecified blood type, Rh negative: Secondary | ICD-10-CM

## 2017-04-06 DIAGNOSIS — N898 Other specified noninflammatory disorders of vagina: Secondary | ICD-10-CM

## 2017-04-06 DIAGNOSIS — O99113 Other diseases of the blood and blood-forming organs and certain disorders involving the immune mechanism complicating pregnancy, third trimester: Secondary | ICD-10-CM

## 2017-04-06 DIAGNOSIS — O26899 Other specified pregnancy related conditions, unspecified trimester: Secondary | ICD-10-CM

## 2017-04-06 DIAGNOSIS — D696 Thrombocytopenia, unspecified: Secondary | ICD-10-CM

## 2017-04-06 DIAGNOSIS — O09893 Supervision of other high risk pregnancies, third trimester: Secondary | ICD-10-CM

## 2017-04-06 LAB — OB RESULTS CONSOLE GBS: GBS: NEGATIVE

## 2017-04-06 NOTE — Progress Notes (Signed)
Prenatal Visit Note Date: 04/06/2017 Clinic: [facility]  Subjective:  Hannah Ball is a 26 y.o. G2P1001 at 8243w5d being seen today for ongoing prenatal care.  She is currently monitored for the following issues for this high-risk pregnancy and has Supervision of normal pregnancy, antepartum; Rh negative, antepartum; Echogenic intracardiac focus of fetus on prenatal ultrasound; Encounter for repeat ultrasound of fetal pyelectasis in singleton pregnancy, antepartum; Obesity in pregnancy; and Gestational thrombocytopenia (HCC) on her problem list.  Patient reports occasional vaginal d/c, sometimes seems watery..   Contractions: Not present. Vag. Bleeding: None.  Movement: Present. Denies leaking of fluid.   The following portions of the patient's history were reviewed and updated as appropriate: allergies, current medications, past family history, past medical history, past social history, past surgical history and problem list. Problem list updated.  Objective:   Vitals:   04/06/17 0901  BP: 103/67  Pulse: 92  Weight: 199 lb (90.3 kg)    Fetal Status: Fetal Heart Rate (bpm): 134 Fundal Height: 36 cm Movement: Present  Presentation: Vertex  General:  Alert, oriented and cooperative. Patient is in no acute distress.  Skin: Skin is warm and dry. No rash noted.   Cardiovascular: Normal heart rate noted  Respiratory: Normal respiratory effort, no problems with respiration noted  Abdomen: Soft, gravid, appropriate for gestational age. Pain/Pressure: Present     Pelvic:  Cervical exam performed        EGBUS normal Vaginal vault with normal white d/c cx visually closed, negative cough test.  Extremities: Normal range of motion.  Edema: Trace  Mental Status: Normal mood and affect. Normal behavior. Normal judgment and thought content.   Urinalysis:      Assessment and Plan:  Pregnancy: G2P1001 at 1143w5d  1. Benign gestational thrombocytopenia in third trimester  (HCC) Recheck at 37-39wks  2. Supervision of other normal pregnancy, antepartum Routine care - Strep Gp B Culture+Rflx - Cervicovaginal ancillary only  Preterm labor symptoms and general obstetric precautions including but not limited to vaginal bleeding, contractions, leaking of fluid and fetal movement were reviewed in detail with the patient. Please refer to After Visit Summary for other counseling recommendations.  Return in about 10 days (around 04/16/2017).   [place] BingPickens, Khaleem Burchill, MD

## 2017-04-09 LAB — CERVICOVAGINAL ANCILLARY ONLY
Chlamydia: NEGATIVE
Neisseria Gonorrhea: NEGATIVE

## 2017-04-10 LAB — STREP GP B CULTURE+RFLX: STREP GP B CULTURE+RFLX: NEGATIVE

## 2017-04-16 ENCOUNTER — Observation Stay
Admission: EM | Admit: 2017-04-16 | Discharge: 2017-04-16 | Disposition: A | Discharge status: Home / Self Care | Payer: Medicaid Other | Attending: Obstetrics and Gynecology | Admitting: Obstetrics and Gynecology

## 2017-04-16 ENCOUNTER — Ambulatory Visit (INDEPENDENT_AMBULATORY_CARE_PROVIDER_SITE_OTHER): Payer: Medicaid Other | Admitting: Obstetrics and Gynecology

## 2017-04-16 VITALS — BP 94/58 | HR 96 | Wt 200.0 lb

## 2017-04-16 DIAGNOSIS — Z3A37 37 weeks gestation of pregnancy: Secondary | ICD-10-CM | POA: Diagnosis not present

## 2017-04-16 DIAGNOSIS — R109 Unspecified abdominal pain: Secondary | ICD-10-CM | POA: Diagnosis not present

## 2017-04-16 DIAGNOSIS — M549 Dorsalgia, unspecified: Secondary | ICD-10-CM | POA: Diagnosis present

## 2017-04-16 DIAGNOSIS — O99210 Obesity complicating pregnancy, unspecified trimester: Secondary | ICD-10-CM

## 2017-04-16 DIAGNOSIS — M545 Low back pain: Secondary | ICD-10-CM | POA: Diagnosis not present

## 2017-04-16 DIAGNOSIS — Z3480 Encounter for supervision of other normal pregnancy, unspecified trimester: Secondary | ICD-10-CM

## 2017-04-16 DIAGNOSIS — O9989 Other specified diseases and conditions complicating pregnancy, childbirth and the puerperium: Secondary | ICD-10-CM

## 2017-04-16 DIAGNOSIS — O26893 Other specified pregnancy related conditions, third trimester: Secondary | ICD-10-CM | POA: Diagnosis not present

## 2017-04-16 DIAGNOSIS — O26899 Other specified pregnancy related conditions, unspecified trimester: Secondary | ICD-10-CM

## 2017-04-16 DIAGNOSIS — Z6791 Unspecified blood type, Rh negative: Secondary | ICD-10-CM

## 2017-04-16 DIAGNOSIS — O99891 Other specified diseases and conditions complicating pregnancy: Secondary | ICD-10-CM

## 2017-04-16 DIAGNOSIS — Z3483 Encounter for supervision of other normal pregnancy, third trimester: Secondary | ICD-10-CM | POA: Diagnosis not present

## 2017-04-16 LAB — URINALYSIS, COMPLETE (UACMP) WITH MICROSCOPIC
BILIRUBIN URINE: NEGATIVE
GLUCOSE, UA: 50 mg/dL — AB
Ketones, ur: NEGATIVE mg/dL
LEUKOCYTES UA: NEGATIVE
NITRITE: NEGATIVE
PH: 5 (ref 5.0–8.0)
Protein, ur: 30 mg/dL — AB
SPECIFIC GRAVITY, URINE: 1.024 (ref 1.005–1.030)
WBC, UA: NONE SEEN WBC/hpf (ref 0–5)

## 2017-04-16 MED ORDER — HYDROCORTISONE ACETATE 25 MG RE SUPP: 25.0000 mg | Freq: Two times a day (BID) | RECTAL | 2 refills | Status: DC

## 2017-04-16 MED ORDER — ONDANSETRON HCL 4 MG/2ML IJ SOLN
4.0000 mg | Freq: Four times a day (QID) | INTRAMUSCULAR | Status: DC | PRN
  Administered 2017-04-16: 4 mg via INTRAVENOUS
  Filled 2017-04-16: qty 2

## 2017-04-16 MED ORDER — ACETAMINOPHEN 325 MG PO TABS
650.0000 mg | ORAL_TABLET | ORAL | Status: DC | PRN
  Administered 2017-04-16: 650 mg via ORAL
  Filled 2017-04-16: qty 2

## 2017-04-16 MED ORDER — HYDROCORTISONE 2.5 % RE CREA: 1.0000 "application " | TOPICAL_CREAM | Freq: Two times a day (BID) | RECTAL | 1 refills | Status: DC

## 2017-04-16 MED ORDER — LACTATED RINGERS IV SOLN
500.0000 mL | INTRAVENOUS | Status: DC | PRN
  Administered 2017-04-16: 1000 mL via INTRAVENOUS

## 2017-04-16 MED ORDER — OXYCODONE-ACETAMINOPHEN 5-325 MG PO TABS
1.0000 | ORAL_TABLET | ORAL | Status: DC | PRN
  Administered 2017-04-16 (×2): 1 via ORAL
  Filled 2017-04-16 (×2): qty 1

## 2017-04-16 NOTE — Progress Notes (Signed)
S: Pt states she does not feel any better after IV started and meds for nausea. Pt says her "lower back is hurting and has not stopped".  O: UC: no UC's on the monitor. NO LOF, NO VB or decreased fM. Vitals:   04/16/17 1805  BP: (!) 100/50  Pulse: 79  Labs: Urine is pending A: 1. Lower back pain  2. IUP at 37 1/7 weeks 3. Vomiting P: 1. Heating pack for lower back pain. 2. IV Hydration 3. Monitor for UC/FHT's 4. Awaiting urine results. Sharee Pimplearon W. Tucker Steedley, RN, MSN, CNM, FNP

## 2017-04-16 NOTE — Progress Notes (Signed)
Prenatal Visit Note Date: 04/16/2017 Clinic: [facility]  Subjective:  Hannah Ball is a 26 y.o. G2P1001 at 5538w1d being seen today for ongoing prenatal care.  She is currently monitored for the following issues for this low-risk pregnancy and has Supervision of normal pregnancy, antepartum; Rh negative, antepartum; Echogenic intracardiac focus of fetus on prenatal ultrasound; Encounter for repeat ultrasound of fetal pyelectasis in singleton pregnancy, antepartum; Obesity in pregnancy; and Gestational thrombocytopenia (HCC) on her problem list.  Patient reports no complaints.   Contractions: Irregular. Vag. Bleeding: None.  Movement: Present. Denies leaking of fluid.   The following portions of the patient's history were reviewed and updated as appropriate: allergies, current medications, past family history, past medical history, past social history, past surgical history and problem list. Problem list updated.  Objective:   Vitals:   04/16/17 1312  BP: (!) 94/58  Pulse: 96  Weight: 200 lb (90.7 kg)    Fetal Status: Fetal Heart Rate (bpm): 126 Fundal Height: 37 cm Movement: Present  Presentation: Vertex  General:  Alert, oriented and cooperative. Patient is in no acute distress.  Skin: Skin is warm and dry. No rash noted.   Cardiovascular: Normal heart rate noted  Respiratory: Normal respiratory effort, no problems with respiration noted  Abdomen: Soft, gravid, appropriate for gestational age. Pain/Pressure: Present     Pelvic:  Cervical exam performed Dilation: 1 Effacement (%): 50 Station: Ballotable  Extremities: Normal range of motion.  Edema: Trace  Mental Status: Normal mood and affect. Normal behavior. Normal judgment and thought content.   Urinalysis:      Assessment and Plan:  Pregnancy: G2P1001 at 1338w1d  1. Supervision of other normal pregnancy, antepartum Routine care. H/o gestational thrombocytopenia. If still normal, can defer rpt checks until  L&D admit - CBC  Term labor symptoms and general obstetric precautions including but not limited to vaginal bleeding, contractions, leaking of fluid and fetal movement were reviewed in detail with the patient. Please refer to After Visit Summary for other counseling recommendations.  Return in about 1 week (around 04/23/2017) for 7-10d rob.   [place] BingPickens, Lloyd Ayo, MD

## 2017-04-16 NOTE — Progress Notes (Signed)
Hannah Ball is a 26 y.o. female. She is at [redacted]w[redacted]d gestation. Patient's last menstrual period was 07/30/2016. Estimated Date of Delivery: 05/06/17  Prenatal care site: Dr Gomez Cleverly pt Chief complaint: lower back pain and abd discomfort with some mild contactions, NO LOF, NO VB or decreased FM. Pt has a 1pm visit with Dr Vergie Living and was checked cervically and noted to be 1/50%/vtx and feels she cannot relax or sit back due to lower back pain.   Location:Lower back Lt>Rt and lower abd  Onset/timing:Since 1 pm today Duration:Continuous  Quality: Back pain is intense  Severity:Severe per pt Aggravating or alleviating conditions: Abd cramping now Associated signs/symptoms: no S/S of labor, no UC's on monitor but, pt is sitting up right.  Context:No S/S of dysuria, no burning with urination  S: Resting comfortably. no CTX, no VB.no LOF,  Active fetal movement. +  Maternal Medical History:   Past Medical History:  Diagnosis Date  . Nausea/vomiting in pregnancy 10/26/2016   Trial of diclegis   . Ovarian cyst   . Type A blood, Rh negative     Past Surgical History:  Procedure Laterality Date  . LAPAROSCOPIC OVARIAN CYSTECTOMY      Allergies  Allergen Reactions  . Benadryl [Diphenhydramine] Hives  . Neosporin Plus Max St Other (See Comments)    Skin burns  . Latex Rash  . Penicillins Rash    Has patient had a PCN reaction causing immediate rash, facial/tongue/throat swelling, SOB or lightheadedness with hypotension: Yes Has patient had a PCN reaction causing severe rash involving mucus membranes or skin necrosis: No Has patient had a PCN reaction that required hospitalization No Has patient had a PCN reaction occurring within the last 10 years: No If all of the above answers are "NO", then may proceed with Cephalosporin use.     Prior to Admission medications   Medication Sig Start Date End Date Taking? Authorizing Provider  hydrocortisone (ANUSOL-HC) 2.5 % rectal cream Place 1  application rectally 2 (two) times daily. 04/16/17   [place] Bing, MD  hydrocortisone (ANUSOL-HC) 25 MG suppository Place 1 suppository (25 mg total) rectally 2 (two) times daily. 04/16/17   [place] Bing, MD     Social History: She  reports that she has never smoked. She has never used smokeless tobacco. She reports that she does not drink alcohol or use drugs.  Family History: family history is not on file.  no history of gyn cancers  Review of Systems: A full review of systems was performed and negative except as noted in the HPI.     O:  BP (!) 100/50   Pulse 79   LMP 07/30/2016  No results found for this or any previous visit (from the past 48 hour(s)).   Constitutional: NAD, AAOx3  HE/ENT: extraocular movements grossly intact, moist mucous membranes CV: RRR PULM: nl respiratory effort, CTABL     Abd: gravid, non-tender, non-distended, soft      Ext: Non-tender, Nonedmeatous   Psych: mood appropriate, speech normal Pelvic:1/50%/vtx-2 per RN  Back: +paraspinal tenderness over the Lt side  NST: reactive NST with 2 accels 15 x 15 BPM Baseline: 150 Variability: moderate Accelerations present x >2 Decelerations absent Time    A/P: 26 y.o. [redacted]w[redacted]d here for antenatal surveillance for lower back pain and abd cramping  Labor: not present.   Fetal Wellbeing: Reassuring Cat 1 tracing.  Reactive NST   D/c home stable, precautions reviewed, follow-up as scheduled.   ----- Ranae Plumber, MD  Attending Obstetrician and Gynecologist [facility], Department of OB/GYN [facility]

## 2017-04-16 NOTE — Discharge Summary (Signed)
Obstetric Discharge Summary   Patient ID: Hannah Ball MRN: [id number] DOB/AGE: 04-24-1991 26 y.o.   Date of Admission: 04/16/2017  Date of Discharge: 04/16/17  Admitting Diagnosis: IUP at 2266w1d  Secondary Diagnosis:Back pain and cramping  Mode of Delivery:N/A     Discharge Diagnosis: IUP at term with back pain   Intrapartum Procedures: N/A   Post partum procedures: N/A  Complications: None    Brief Hospital Course  Hannah Ball is a G2P1001 who had lower back pain and some cramping today. Vomited and nausea given fluid hydration and Percocet for back pain with some relief with heating pad.  By time of discharge,  her pain was controlled on oral pain medications; tolerating regular diet.  She was deemed stable for discharge to home.     Labs: CBC Latest Ref Rng & Units 03/23/2017 02/15/2017 12/13/2016  WBC 3.6 - 11.0 K/uL 7.1 8.3 7.8  Hemoglobin 12.0 - 16.0 g/dL 10.6(L) 12.3 14.4  Hematocrit 35.0 - 47.0 % 32.2(L) 36.8 42.3  Platelets 150 - 440 K/uL 172 168 121(L)   A  Physical exam:  Blood pressure (!) 100/50, pulse 79, last menstrual period 07/30/2016. General: alert and no distress Resp reg and non-labored Abdomen: gravid. Back: +paraspinal tenseness of Lt side  Extremities: No evidence of DVT seen on physical exam. No lower extremity edema.  Discharge Instructions: Per After Visit Summary. Activity: Advance as tolerated.  Diet: Regular Medications:PNV  Outpatient follow up: with her MD as scheduled  Discharged Condition: Stable   Discharged to: home     Sharee PimpleCaron W Marcus Schwandt, [place] 04/16/2017

## 2017-04-16 NOTE — OB Triage Note (Signed)
Patient presented to L&D complaining of abdominal pain and back pain that started around 1430. Denies any leaking of fluid, vaginal bleeding or decreased fetal movement.

## 2017-04-17 LAB — CBC
Hematocrit: 32.6 % — ABNORMAL LOW (ref 34.0–46.6)
Hemoglobin: 10.6 g/dL — ABNORMAL LOW (ref 11.1–15.9)
MCH: 24.2 pg — AB (ref 26.6–33.0)
MCHC: 32.5 g/dL (ref 31.5–35.7)
MCV: 74 fL — AB (ref 79–97)
PLATELETS: 150 10*3/uL (ref 150–379)
RBC: 4.38 x10E6/uL (ref 3.77–5.28)
RDW: 15 % (ref 12.3–15.4)
WBC: 4.9 10*3/uL (ref 3.4–10.8)

## 2017-04-18 LAB — URINE CULTURE: Culture: NO GROWTH

## 2017-04-25 ENCOUNTER — Ambulatory Visit (INDEPENDENT_AMBULATORY_CARE_PROVIDER_SITE_OTHER): Payer: Medicaid Other | Admitting: Family Medicine

## 2017-04-25 VITALS — BP 94/63 | HR 105 | Wt 199.0 lb

## 2017-04-25 DIAGNOSIS — M549 Dorsalgia, unspecified: Secondary | ICD-10-CM

## 2017-04-25 DIAGNOSIS — O99210 Obesity complicating pregnancy, unspecified trimester: Secondary | ICD-10-CM

## 2017-04-25 DIAGNOSIS — D696 Thrombocytopenia, unspecified: Secondary | ICD-10-CM | POA: Diagnosis not present

## 2017-04-25 DIAGNOSIS — O99213 Obesity complicating pregnancy, third trimester: Secondary | ICD-10-CM

## 2017-04-25 DIAGNOSIS — Z3480 Encounter for supervision of other normal pregnancy, unspecified trimester: Secondary | ICD-10-CM

## 2017-04-25 DIAGNOSIS — O99113 Other diseases of the blood and blood-forming organs and certain disorders involving the immune mechanism complicating pregnancy, third trimester: Secondary | ICD-10-CM

## 2017-04-25 DIAGNOSIS — Z6791 Unspecified blood type, Rh negative: Secondary | ICD-10-CM

## 2017-04-25 DIAGNOSIS — O26899 Other specified pregnancy related conditions, unspecified trimester: Secondary | ICD-10-CM

## 2017-04-25 DIAGNOSIS — O09899 Supervision of other high risk pregnancies, unspecified trimester: Secondary | ICD-10-CM

## 2017-04-25 DIAGNOSIS — O9989 Other specified diseases and conditions complicating pregnancy, childbirth and the puerperium: Secondary | ICD-10-CM

## 2017-04-25 DIAGNOSIS — E669 Obesity, unspecified: Secondary | ICD-10-CM

## 2017-04-25 DIAGNOSIS — O26893 Other specified pregnancy related conditions, third trimester: Secondary | ICD-10-CM

## 2017-04-25 NOTE — Progress Notes (Signed)
   PRENATAL VISIT NOTE  Subjective:  Hannah Ball is a 26 y.o. G2P1001 at 4474w3d being seen today for ongoing prenatal care.  She is currently monitored for the following issues for this low-risk pregnancy and has Supervision of normal pregnancy, antepartum; Rh negative, antepartum; Echogenic intracardiac focus of fetus on prenatal ultrasound; Encounter for repeat ultrasound of fetal pyelectasis in singleton pregnancy, antepartum; Obesity in pregnancy; Gestational thrombocytopenia (HCC); and Back pain affecting pregnancy on her problem list.  Patient reports no complaints.  Contractions: Irregular. Vag. Bleeding: None.  Movement: Present. Denies leaking of fluid.   The following portions of the patient's history were reviewed and updated as appropriate: allergies, current medications, past family history, past medical history, past social history, past surgical history and problem list. Problem list updated.  Objective:   Vitals:   04/25/17 1638  BP: 94/63  Pulse: (!) 105  Weight: 199 lb (90.3 kg)    Fetal Status: Fetal Heart Rate (bpm): 138 Fundal Height: 38 cm Movement: Present  Presentation: Vertex  General:  Alert, oriented and cooperative. Patient is in no acute distress.  Skin: Skin is warm and dry. No rash noted.   Cardiovascular: Normal heart rate noted  Respiratory: Normal respiratory effort, no problems with respiration noted  Abdomen: Soft, gravid, appropriate for gestational age. Pain/Pressure: Present     Pelvic:  Cervical exam performed Dilation: 1 Effacement (%): 50 Station: Ballotable  Extremities: Normal range of motion.  Edema: Trace  Mental Status: Normal mood and affect. Normal behavior. Normal judgment and thought content.   Assessment and Plan:  Pregnancy: G2P1001 at 7074w3d  1. Benign gestational thrombocytopenia in third trimester (HCC) - CBC  2. Supervision of other normal pregnancy, antepartum UTD Consider sweeping membranes at next visit  3. Rh  negative, antepartum Received rhogam  4. Obesity in pregnancy Monitor weight gain  5. Back pain affecting pregnancy in third trimester   Term labor symptoms and general obstetric precautions including but not limited to vaginal bleeding, contractions, leaking of fluid and fetal movement were reviewed in detail with the patient. Please refer to After Visit Summary for other counseling recommendations.  Return in about 1 week (around 05/02/2017) for Routine prenatal care.   Federico FlakeKimberly Niles Jayvin Hurrell, MD

## 2017-04-25 NOTE — Patient Instructions (Signed)
Breastfeeding Deciding to breastfeed is one of the best choices you can make for you and your baby. A change in hormones during pregnancy causes your breast tissue to grow and increases the number and size of your milk ducts. These hormones also allow proteins, sugars, and fats from your blood supply to make breast milk in your milk-producing glands. Hormones prevent breast milk from being released before your baby is born as well as prompt milk flow after birth. Once breastfeeding has begun, thoughts of your baby, as well as his or her sucking or crying, can stimulate the release of milk from your milk-producing glands. Benefits of breastfeeding For Your Baby  Your first milk (colostrum) helps your baby's digestive system function better.  There are antibodies in your milk that help your baby fight off infections.  Your baby has a lower incidence of asthma, allergies, and sudden infant death syndrome.  The nutrients in breast milk are better for your baby than infant formulas and are designed uniquely for your baby's needs.  Breast milk improves your baby's brain development.  Your baby is less likely to develop other conditions, such as childhood obesity, asthma, or type 2 diabetes mellitus.  For You  Breastfeeding helps to create a very special bond between you and your baby.  Breastfeeding is convenient. Breast milk is always available at the correct temperature and costs nothing.  Breastfeeding helps to burn calories and helps you lose the weight gained during pregnancy.  Breastfeeding makes your uterus contract to its prepregnancy size faster and slows bleeding (lochia) after you give birth.  Breastfeeding helps to lower your risk of developing type 2 diabetes mellitus, osteoporosis, and breast or ovarian cancer later in life.  Signs that your baby is hungry Early Signs of Hunger  Increased alertness or activity.  Stretching.  Movement of the head from side to  side.  Movement of the head and opening of the mouth when the corner of the mouth or cheek is stroked (rooting).  Increased sucking sounds, smacking lips, cooing, sighing, or squeaking.  Hand-to-mouth movements.  Increased sucking of fingers or hands.  Late Signs of Hunger  Fussing.  Intermittent crying.  Extreme Signs of Hunger Signs of extreme hunger will require calming and consoling before your baby will be able to breastfeed successfully. Do not wait for the following signs of extreme hunger to occur before you initiate breastfeeding:  Restlessness.  A loud, strong cry.  Screaming.  Breastfeeding basics Breastfeeding Initiation  Find a comfortable place to sit or lie down, with your neck and back well supported.  Place a pillow or rolled up blanket under your baby to bring him or her to the level of your breast (if you are seated). Nursing pillows are specially designed to help support your arms and your baby while you breastfeed.  Make sure that your baby's abdomen is facing your abdomen.  Gently massage your breast. With your fingertips, massage from your chest wall toward your nipple in a circular motion. This encourages milk flow. You may need to continue this action during the feeding if your milk flows slowly.  Support your breast with 4 fingers underneath and your thumb above your nipple. Make sure your fingers are well away from your nipple and your baby's mouth.  Stroke your baby's lips gently with your finger or nipple.  When your baby's mouth is open wide enough, quickly bring your baby to your breast, placing your entire nipple and as much of the colored area   around your nipple (areola) as possible into your baby's mouth. ? More areola should be visible above your baby's upper lip than below the lower lip. ? Your baby's tongue should be between his or her lower gum and your breast.  Ensure that your baby's mouth is correctly positioned around your nipple  (latched). Your baby's lips should create a seal on your breast and be turned out (everted).  It is common for your baby to suck about 2-3 minutes in order to start the flow of breast milk.  Latching Teaching your baby how to latch on to your breast properly is very important. An improper latch can cause nipple pain and decreased milk supply for you and poor weight gain in your baby. Also, if your baby is not latched onto your nipple properly, he or she may swallow some air during feeding. This can make your baby fussy. Burping your baby when you switch breasts during the feeding can help to get rid of the air. However, teaching your baby to latch on properly is still the best way to prevent fussiness from swallowing air while breastfeeding. Signs that your baby has successfully latched on to your nipple:  Silent tugging or silent sucking, without causing you pain.  Swallowing heard between every 3-4 sucks.  Muscle movement above and in front of his or her ears while sucking.  Signs that your baby has not successfully latched on to nipple:  Sucking sounds or smacking sounds from your baby while breastfeeding.  Nipple pain.  If you think your baby has not latched on correctly, slip your finger into the corner of your baby's mouth to break the suction and place it between your baby's gums. Attempt breastfeeding initiation again. Signs of Successful Breastfeeding Signs from your baby:  A gradual decrease in the number of sucks or complete cessation of sucking.  Falling asleep.  Relaxation of his or her body.  Retention of a small amount of milk in his or her mouth.  Letting go of your breast by himself or herself.  Signs from you:  Breasts that have increased in firmness, weight, and size 1-3 hours after feeding.  Breasts that are softer immediately after breastfeeding.  Increased milk volume, as well as a change in milk consistency and color by the fifth day of  breastfeeding.  Nipples that are not sore, cracked, or bleeding.  Signs That Your Baby is Getting Enough Milk  Wetting at least 1-2 diapers during the first 24 hours after birth.  Wetting at least 5-6 diapers every 24 hours for the first week after birth. The urine should be clear or pale yellow by 5 days after birth.  Wetting 6-8 diapers every 24 hours as your baby continues to grow and develop.  At least 3 stools in a 24-hour period by age 5 days. The stool should be soft and yellow.  At least 3 stools in a 24-hour period by age 7 days. The stool should be seedy and yellow.  No loss of weight greater than 10% of birth weight during the first 3 days of age.  Average weight gain of 4-7 ounces (113-198 g) per week after age 4 days.  Consistent daily weight gain by age 5 days, without weight loss after the age of 2 weeks.  After a feeding, your baby may spit up a small amount. This is common. Breastfeeding frequency and duration Frequent feeding will help you make more milk and can prevent sore nipples and breast engorgement. Breastfeed when   you feel the need to reduce the fullness of your breasts or when your baby shows signs of hunger. This is called "breastfeeding on demand." Avoid introducing a pacifier to your baby while you are working to establish breastfeeding (the first 4-6 weeks after your baby is born). After this time you may choose to use a pacifier. Research has shown that pacifier use during the first year of a baby's life decreases the risk of sudden infant death syndrome (SIDS). Allow your baby to feed on each breast as long as he or she wants. Breastfeed until your baby is finished feeding. When your baby unlatches or falls asleep while feeding from the first breast, offer the second breast. Because newborns are often sleepy in the first few weeks of life, you may need to awaken your baby to get him or her to feed. Breastfeeding times will vary from baby to baby. However,  the following rules can serve as a guide to help you ensure that your baby is properly fed:  Newborns (babies 4 weeks of age or younger) may breastfeed every 1-3 hours.  Newborns should not go longer than 3 hours during the day or 5 hours during the night without breastfeeding.  You should breastfeed your baby a minimum of 8 times in a 24-hour period until you begin to introduce solid foods to your baby at around 6 months of age.  Breast milk pumping Pumping and storing breast milk allows you to ensure that your baby is exclusively fed your breast milk, even at times when you are unable to breastfeed. This is especially important if you are going back to work while you are still breastfeeding or when you are not able to be present during feedings. Your lactation consultant can give you guidelines on how long it is safe to store breast milk. A breast pump is a machine that allows you to pump milk from your breast into a sterile bottle. The pumped breast milk can then be stored in a refrigerator or freezer. Some breast pumps are operated by hand, while others use electricity. Ask your lactation consultant which type will work best for you. Breast pumps can be purchased, but some hospitals and breastfeeding support groups lease breast pumps on a monthly basis. A lactation consultant can teach you how to hand express breast milk, if you prefer not to use a pump. Caring for your breasts while you breastfeed Nipples can become dry, cracked, and sore while breastfeeding. The following recommendations can help keep your breasts moisturized and healthy:  Avoid using soap on your nipples.  Wear a supportive bra. Although not required, special nursing bras and tank tops are designed to allow access to your breasts for breastfeeding without taking off your entire bra or top. Avoid wearing underwire-style bras or extremely tight bras.  Air dry your nipples for 3-4minutes after each feeding.  Use only cotton  bra pads to absorb leaked breast milk. Leaking of breast milk between feedings is normal.  Use lanolin on your nipples after breastfeeding. Lanolin helps to maintain your skin's normal moisture barrier. If you use pure lanolin, you do not need to wash it off before feeding your baby again. Pure lanolin is not toxic to your baby. You may also hand express a few drops of breast milk and gently massage that milk into your nipples and allow the milk to air dry.  In the first few weeks after giving birth, some women experience extremely full breasts (engorgement). Engorgement can make your   breasts feel heavy, warm, and tender to the touch. Engorgement peaks within 3-5 days after you give birth. The following recommendations can help ease engorgement:  Completely empty your breasts while breastfeeding or pumping. You may want to start by applying warm, moist heat (in the shower or with warm water-soaked hand towels) just before feeding or pumping. This increases circulation and helps the milk flow. If your baby does not completely empty your breasts while breastfeeding, pump any extra milk after he or she is finished.  Wear a snug bra (nursing or regular) or tank top for 1-2 days to signal your body to slightly decrease milk production.  Apply ice packs to your breasts, unless this is too uncomfortable for you.  Make sure that your baby is latched on and positioned properly while breastfeeding.  If engorgement persists after 48 hours of following these recommendations, contact your health care provider or a lactation consultant. Overall health care recommendations while breastfeeding  Eat healthy foods. Alternate between meals and snacks, eating 3 of each per day. Because what you eat affects your breast milk, some of the foods may make your baby more irritable than usual. Avoid eating these foods if you are sure that they are negatively affecting your baby.  Drink milk, fruit juice, and water to  satisfy your thirst (about 10 glasses a day).  Rest often, relax, and continue to take your prenatal vitamins to prevent fatigue, stress, and anemia.  Continue breast self-awareness checks.  Avoid chewing and smoking tobacco. Chemicals from cigarettes that pass into breast milk and exposure to secondhand smoke may harm your baby.  Avoid alcohol and drug use, including marijuana. Some medicines that may be harmful to your baby can pass through breast milk. It is important to ask your health care provider before taking any medicine, including all over-the-counter and prescription medicine as well as vitamin and herbal supplements. It is possible to become pregnant while breastfeeding. If birth control is desired, ask your health care provider about options that will be safe for your baby. Contact a health care provider if:  You feel like you want to stop breastfeeding or have become frustrated with breastfeeding.  You have painful breasts or nipples.  Your nipples are cracked or bleeding.  Your breasts are red, tender, or warm.  You have a swollen area on either breast.  You have a fever or chills.  You have nausea or vomiting.  You have drainage other than breast milk from your nipples.  Your breasts do not become full before feedings by the fifth day after you give birth.  You feel sad and depressed.  Your baby is too sleepy to eat well.  Your baby is having trouble sleeping.  Your baby is wetting less than 3 diapers in a 24-hour period.  Your baby has less than 3 stools in a 24-hour period.  Your baby's skin or the white part of his or her eyes becomes yellow.  Your baby is not gaining weight by 5 days of age. Get help right away if:  Your baby is overly tired (lethargic) and does not want to wake up and feed.  Your baby develops an unexplained fever. This information is not intended to replace advice given to you by your health care provider. Make sure you discuss  any questions you have with your health care provider. Document Released: 10/16/2005 Document Revised: 03/29/2016 Document Reviewed: 04/09/2013 Elsevier Interactive Patient Education  2017 [employer] Inc[employer]  

## 2017-04-26 LAB — CBC
Hematocrit: 35.2 % (ref 34.0–46.6)
Hemoglobin: 10.8 g/dL — ABNORMAL LOW (ref 11.1–15.9)
MCH: 23.1 pg — ABNORMAL LOW (ref 26.6–33.0)
MCHC: 30.7 g/dL — ABNORMAL LOW (ref 31.5–35.7)
MCV: 75 fL — ABNORMAL LOW (ref 79–97)
PLATELETS: 167 10*3/uL (ref 150–379)
RBC: 4.68 x10E6/uL (ref 3.77–5.28)
RDW: 15.5 % — AB (ref 12.3–15.4)
WBC: 7.2 10*3/uL (ref 3.4–10.8)

## 2017-04-27 ENCOUNTER — Encounter (HOSPITAL_COMMUNITY): Payer: Self-pay

## 2017-04-27 ENCOUNTER — Emergency Department (HOSPITAL_COMMUNITY)
Admission: EM | Admit: 2017-04-27 | Discharge: 2017-04-27 | Disposition: A | Discharge status: Home / Self Care | Payer: No Typology Code available for payment source | Attending: Emergency Medicine | Admitting: Emergency Medicine

## 2017-04-27 DIAGNOSIS — O9A213 Injury, poisoning and certain other consequences of external causes complicating pregnancy, third trimester: Secondary | ICD-10-CM | POA: Diagnosis present

## 2017-04-27 DIAGNOSIS — Z3A39 39 weeks gestation of pregnancy: Secondary | ICD-10-CM | POA: Diagnosis not present

## 2017-04-27 DIAGNOSIS — Z91040 Latex allergy status: Secondary | ICD-10-CM | POA: Insufficient documentation

## 2017-04-27 DIAGNOSIS — R1030 Lower abdominal pain, unspecified: Secondary | ICD-10-CM | POA: Diagnosis not present

## 2017-04-27 LAB — I-STAT CHEM 8, ED
BUN: 5 mg/dL — AB (ref 6–20)
CALCIUM ION: 1.16 mmol/L (ref 1.15–1.40)
CREATININE: 0.6 mg/dL (ref 0.44–1.00)
Chloride: 104 mmol/L (ref 101–111)
GLUCOSE: 110 mg/dL — AB (ref 65–99)
HCT: 29 % — ABNORMAL LOW (ref 36.0–46.0)
Hemoglobin: 9.9 g/dL — ABNORMAL LOW (ref 12.0–15.0)
Potassium: 3.6 mmol/L (ref 3.5–5.1)
Sodium: 136 mmol/L (ref 135–145)
TCO2: 19 mmol/L (ref 0–100)

## 2017-04-27 LAB — CBC WITH DIFFERENTIAL/PLATELET
BASOS PCT: 0 %
Basophils Absolute: 0 10*3/uL (ref 0.0–0.1)
EOS ABS: 0.1 10*3/uL (ref 0.0–0.7)
EOS PCT: 1 %
HEMATOCRIT: 34.5 % — AB (ref 36.0–46.0)
Hemoglobin: 10.6 g/dL — ABNORMAL LOW (ref 12.0–15.0)
Lymphocytes Relative: 27 %
Lymphs Abs: 1.8 10*3/uL (ref 0.7–4.0)
MCH: 23.5 pg — ABNORMAL LOW (ref 26.0–34.0)
MCHC: 30.7 g/dL (ref 30.0–36.0)
MCV: 76.3 fL — ABNORMAL LOW (ref 78.0–100.0)
MONO ABS: 0.5 10*3/uL (ref 0.1–1.0)
MONOS PCT: 8 %
NEUTROS ABS: 4.2 10*3/uL (ref 1.7–7.7)
Neutrophils Relative %: 64 %
Platelets: 165 10*3/uL (ref 150–400)
RBC: 4.52 MIL/uL (ref 3.87–5.11)
RDW: 15.2 % (ref 11.5–15.5)
WBC: 6.6 10*3/uL (ref 4.0–10.5)

## 2017-04-27 LAB — COMPREHENSIVE METABOLIC PANEL
ALBUMIN: 3 g/dL — AB (ref 3.5–5.0)
ALT: 14 U/L (ref 14–54)
ANION GAP: 8 (ref 5–15)
AST: 22 U/L (ref 15–41)
Alkaline Phosphatase: 148 U/L — ABNORMAL HIGH (ref 38–126)
BILIRUBIN TOTAL: 1.5 mg/dL — AB (ref 0.3–1.2)
BUN: 6 mg/dL (ref 6–20)
CO2: 20 mmol/L — ABNORMAL LOW (ref 22–32)
Calcium: 8.7 mg/dL — ABNORMAL LOW (ref 8.9–10.3)
Chloride: 106 mmol/L (ref 101–111)
Creatinine, Ser: 0.71 mg/dL (ref 0.44–1.00)
GFR calc Af Amer: >60 mL/min (ref >60)
GFR calc non Af Amer: >60 mL/min (ref >60)
GLUCOSE: 94 mg/dL (ref 65–99)
POTASSIUM: 3.8 mmol/L (ref 3.5–5.1)
Sodium: 134 mmol/L — ABNORMAL LOW (ref 135–145)
TOTAL PROTEIN: 6.2 g/dL — AB (ref 6.5–8.1)

## 2017-04-27 LAB — LIPASE, BLOOD: LIPASE: 28 U/L (ref 11–51)

## 2017-04-27 MED ORDER — ACETAMINOPHEN 500 MG PO TABS
1000.0000 mg | ORAL_TABLET | Freq: Once | ORAL | Status: AC
  Administered 2017-04-27: 1000 mg via ORAL

## 2017-04-27 MED ORDER — LACTATED RINGERS IV BOLUS (SEPSIS)
1000.0000 mL | Freq: Once | INTRAVENOUS | Status: AC
  Administered 2017-04-27: 1000 mL via INTRAVENOUS

## 2017-04-27 MED ORDER — SODIUM CHLORIDE 0.9 % IV BOLUS (SEPSIS): 1000.0000 mL | Freq: Once | INTRAVENOUS | Status: DC

## 2017-04-27 NOTE — Progress Notes (Signed)
   04/27/17 1850  Clinical Encounter Type  Visited With Patient and family together  Visit Type ED  Spiritual Encounters  Spiritual Needs Emotional  Stress Factors  Patient Stress Factors Health changes  Family Stress Factors None identified  Introduction to Pt and family. Coping and reported no needs.
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
## 2017-04-27 NOTE — ED Notes (Signed)
RN Arlys JohnBrian informed needed another sample for the Chem 8, not enough.
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
## 2017-04-27 NOTE — Progress Notes (Signed)
Tct: Dr. Macon LargeAnyanwu, updated on Cat I EFM tracing, 4 hour monitoring complete.Hannah Ball. Advised that ED provider to discharge pt home from ED. OB cleared.

## 2017-04-27 NOTE — Progress Notes (Signed)
Updated Dr. Jolayne Pantheronstant on patient, Cat I FHT. 2 mild contractions noted on monitor. Patient for 4 hours of EFM.
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
## 2017-04-27 NOTE — ED Notes (Signed)
Patient Alert and oriented X4. Stable and ambulatory. Patient verbalized understanding of the discharge instructions.  Patient belongings were taken by the patient.  

## 2017-04-30 ENCOUNTER — Ambulatory Visit (INDEPENDENT_AMBULATORY_CARE_PROVIDER_SITE_OTHER): Payer: Medicaid Other | Admitting: Obstetrics & Gynecology

## 2017-04-30 ENCOUNTER — Encounter (HOSPITAL_COMMUNITY): Payer: Self-pay

## 2017-04-30 DIAGNOSIS — Z3483 Encounter for supervision of other normal pregnancy, third trimester: Secondary | ICD-10-CM

## 2017-04-30 DIAGNOSIS — Z3480 Encounter for supervision of other normal pregnancy, unspecified trimester: Secondary | ICD-10-CM

## 2017-04-30 NOTE — Patient Instructions (Signed)
Return to clinic for any scheduled appointments or obstetric concerns, or go to MAU for evaluation  

## 2017-04-30 NOTE — Progress Notes (Signed)
   PRENATAL VISIT NOTE  Subjective:  Hannah Ball is a 26 y.o. G2P1001 at 4814w1d being seen today for ongoing prenatal care.  She is currently monitored for the following issues for this low-risk pregnancy and has Supervision of normal pregnancy, antepartum; Rh negative, antepartum; Echogenic intracardiac focus of fetus on prenatal ultrasound; Encounter for repeat ultrasound of fetal pyelectasis in singleton pregnancy, antepartum; Obesity in pregnancy; Gestational thrombocytopenia (HCC); and Back pain affecting pregnancy on her problem list.  Patient reports occasional contractions.  Contractions: Irregular. Vag. Bleeding: None.  Movement: Present. Denies leaking of fluid.   The following portions of the patient's history were reviewed and updated as appropriate: allergies, current medications, past family history, past medical history, past social history, past surgical history and problem list. Problem list updated.  Objective:   Vitals:   04/30/17 1556  BP: 103/67  Pulse: (!) 111  Weight: 198 lb (89.8 kg)    Fetal Status: Fetal Heart Rate (bpm): 132 Fundal Height: 39 cm Movement: Present  Presentation: Vertex  General:  Alert, oriented and cooperative. Patient is in no acute distress.  Skin: Skin is warm and dry. No rash noted.   Cardiovascular: Normal heart rate noted  Respiratory: Normal respiratory effort, no problems with respiration noted  Abdomen: Soft, gravid, appropriate for gestational age. Pain/Pressure: Present     Pelvic:  Cervical exam performed Dilation: 3 Effacement (%): 50 Station: Ballotable  Extremities: Normal range of motion.  Edema: Trace  Mental Status: Normal mood and affect. Normal behavior. Normal judgment and thought content.   Assessment and Plan:  Pregnancy: G2P1001 at 4714w1d  1. Supervision of other normal pregnancy, antepartum Term labor symptoms and general obstetric precautions including but not limited to vaginal bleeding, contractions, leaking  of fluid and fetal movement were reviewed in detail with the patient. Please refer to After Visit Summary for other counseling recommendations.  Return in about 1 week (around 05/07/2017) for OB Visit, NST, AFI.   Jaynie CollinsUgonna Anyanwu, MD

## 2017-05-03 ENCOUNTER — Observation Stay
Admission: EM | Admit: 2017-05-03 | Discharge: 2017-05-03 | Disposition: A | Discharge status: Home / Self Care | Payer: Medicaid Other | Attending: Obstetrics & Gynecology | Admitting: Obstetrics & Gynecology

## 2017-05-03 ENCOUNTER — Encounter: Payer: Self-pay | Admitting: *Deleted

## 2017-05-03 ENCOUNTER — Inpatient Hospital Stay (HOSPITAL_COMMUNITY)
Admission: AD | Admit: 2017-05-03 | Discharge: 2017-05-03 | Disposition: A | Discharge status: Home / Self Care | Payer: Medicaid Other | Source: Ambulatory Visit | Attending: Obstetrics and Gynecology | Admitting: Obstetrics and Gynecology

## 2017-05-03 ENCOUNTER — Inpatient Hospital Stay (HOSPITAL_COMMUNITY): Payer: Medicaid Other

## 2017-05-03 DIAGNOSIS — Z888 Allergy status to other drugs, medicaments and biological substances status: Secondary | ICD-10-CM | POA: Diagnosis not present

## 2017-05-03 DIAGNOSIS — O360930 Maternal care for other rhesus isoimmunization, third trimester, not applicable or unspecified: Secondary | ICD-10-CM | POA: Insufficient documentation

## 2017-05-03 DIAGNOSIS — Z91040 Latex allergy status: Secondary | ICD-10-CM | POA: Insufficient documentation

## 2017-05-03 DIAGNOSIS — O4103X0 Oligohydramnios, third trimester, not applicable or unspecified: Secondary | ICD-10-CM

## 2017-05-03 DIAGNOSIS — O26893 Other specified pregnancy related conditions, third trimester: Secondary | ICD-10-CM | POA: Insufficient documentation

## 2017-05-03 DIAGNOSIS — O471 False labor at or after 37 completed weeks of gestation: Principal | ICD-10-CM | POA: Insufficient documentation

## 2017-05-03 DIAGNOSIS — Z3480 Encounter for supervision of other normal pregnancy, unspecified trimester: Secondary | ICD-10-CM

## 2017-05-03 DIAGNOSIS — N898 Other specified noninflammatory disorders of vagina: Secondary | ICD-10-CM

## 2017-05-03 DIAGNOSIS — Z3A39 39 weeks gestation of pregnancy: Secondary | ICD-10-CM | POA: Insufficient documentation

## 2017-05-03 DIAGNOSIS — Z880 Allergy status to penicillin: Secondary | ICD-10-CM | POA: Diagnosis not present

## 2017-05-03 DIAGNOSIS — Z6791 Unspecified blood type, Rh negative: Secondary | ICD-10-CM

## 2017-05-03 DIAGNOSIS — O26899 Other specified pregnancy related conditions, unspecified trimester: Secondary | ICD-10-CM

## 2017-05-03 DIAGNOSIS — O99210 Obesity complicating pregnancy, unspecified trimester: Secondary | ICD-10-CM

## 2017-05-03 LAB — ROM PLUS (ARMC ONLY): Rom Plus: NEGATIVE

## 2017-05-03 NOTE — OB Triage Note (Signed)
Patient arrived in triage with c/o contractions since this AM approx 7-20 mins apart. Also c/o leaking fluid since approx 0900, clear. Reports positive fetal movement. Denies vaginal bleeding. [facility] and [facility]aged for same complaints. Per pt cervix found to be 3cm (same as Mon) and ruled out SROM. EFM explained and applied. Discussed plan of care. Patient verbalized understanding.

## 2017-05-03 NOTE — Progress Notes (Signed)
Pt DC home with labor and kick count information.  Pt verbalizes understanding.

## 2017-05-03 NOTE — Discharge Summary (Signed)
Hannah Ball is a 26 y.o. female. She is at 8371w4d gestation. Patient's last menstrual period was 07/30/2016. Estimated Date of Delivery: 05/06/17  [facility]  Chief complaint: leaking fluid, contractions  Location: vagina Onset/timing: early AM Duration: <12hrs Quality: clear Severity: mild Aggravating or alleviating conditions: none Associated signs/symptoms: occasional CTX, no VB.+ LOF,  Active fetal movement. Context: pt noted her underwear was wet this AM and was worried about LOF.  She was checked at her OB office and was intact, but noted subjectively to have possible low fluid, and [facility].  Limited OB US performed, with AFI of 11.  She was unsatisfied with this and reported to our triage this evening.  She has not had further collection of fluid nor increased frequency of contractions.     Maternal Medical History:   Past Medical History:  Diagnosis Date  . Nausea/vomiting in pregnancy 10/26/2016   Trial of diclegis   . Ovarian cyst   . Type A blood, Rh negative     Past Surgical History:  Procedure Laterality Date  . LAPAROSCOPIC OVARIAN CYSTECTOMY    . OVARIAN CYST REMOVAL  2008    Allergies  Allergen Reactions  . Benadryl [Diphenhydramine Hcl (Sleep)] Hives  . Latex Hives  . Neosporin Plus Max St Other (See Comments)    Skin burns  . Neosporin Wound Cleanser [Benzalkonium Chloride] Hives  . Penicillins Hives  . Latex Rash  . Penicillins Rash    Has patient had a PCN reaction causing immediate rash, facial/tongue/throat swelling, SOB or lightheadedness with hypotension: Yes Has patient had a PCN reaction causing severe rash involving mucus membranes or skin necrosis: No Has patient had a PCN reaction that required hospitalization No Has patient had a PCN reaction occurring within the last 10 years: No If all of the above answers are "NO", then may proceed with Cephalosporin  use.     Prior to Admission medications   Medication Sig Start Date End Date Taking? Authorizing Provider  acetaminophen (TYLENOL) 500 MG tablet Take 500 mg by mouth every 6 (six) hours as needed for mild pain.   Yes [provider]  calcium carbonate (TUMS - DOSED IN MG ELEMENTAL CALCIUM) 500 MG chewable tablet Chew 2 tablets by mouth daily as needed for indigestion or heartburn.   Yes [provider]  hydrocortisone (ANUSOL-HC) 2.5 % rectal cream Place 1 application rectally 2 (two) times daily. Patient not taking: Reported on 04/25/2017 04/16/17   [place] BingPickens, Charlie, MD  hydrocortisone (ANUSOL-HC) 25 MG suppository Place 1 suppository (25 mg total) rectally 2 (two) times daily. Patient not taking: Reported on 04/25/2017 04/16/17   [place] BingPickens, Charlie, MD     Social History: She  reports that she has never smoked. She has never used smokeless tobacco. She reports that she does not drink alcohol or use drugs.  Family History: no history of gyn cancers  Review of Systems: A full review of systems was performed and negative except as noted in the HPI.     O:  BP 98/66 (BP Location: Left Arm)   Pulse 96   Temp 97.9 F (36.6 C) (Axillary)   Resp 18   Ht 5\' 8"  (1.727 m)   Wt 198 lb (89.8 kg)   LMP 07/30/2016   BMI 30.11 kg/m  Results for orders placed or performed during the hospital encounter of 05/03/17 (from the past 48 hour(s))  ROM Plus [facility]  only)   Collection Time: 05/03/17  8:03 PM  Result Value Ref Range   Rom Plus NEGATIVE      Constitutional: NAD, AAOx3  HE/ENT: extraocular movements grossly intact, moist mucous membranes CV: RRR PULM: nl respiratory effort, CTABL     Abd: gravid, non-tender, non-distended, soft      Ext: Non-tender, Nonedmeatous   Psych: mood appropriate, speech normal Pelvic 4/50/-3, posterior, soft. Nitrazine negative, negative pooling. Membranes swept.  NST:  Baseline: 130 Variability: moderate Accelerations present x  >2 Decelerations absent Time    A/P: 26 y.o. [redacted]w[redacted]d here for rule out rupture  Labor: not present.   Fetal Wellbeing: Reassuring Cat 1 tracing.  Reactive NST   Nitrizine neg, no pooling, and ROM plus negative  D/c home stable, precautions reviewed, she would like to transfer care to [facility], and deliver here.  I will try to get her an appointment with [place] if she does not go into labor in the next week since she was seen today.  ----- Ranae Plumber, MD Attending Obstetrician and Gynecologist [facility], Department of OB/GYN [facility]

## 2017-05-03 NOTE — Progress Notes (Signed)
No ferning noted.  MAU provider notified to preform SSE to R/O ROM.

## 2017-05-03 NOTE — Progress Notes (Signed)
Ultrasound, EFM and pt status evaluated.  Pt is not thought to be ruptured.  Pt to be DC home.

## 2017-05-03 NOTE — Discharge Instructions (Signed)

## 2017-05-03 NOTE — Progress Notes (Signed)
No Pooling noted on SSE. Sample taken of discharge.  Ferning to be reevaluated.

## 2017-05-03 NOTE — MAU Provider Note (Signed)
S: [name] is a 26 y.o. G2P1001 at 2760w4d  who presents to MAU today complaining of leaking of fluid since 9 am this morning. She denies vaginal bleeding. She denies contractions. She reports normal fetal movement.   Last had intercourse last night.  Was in office this afternoon; had informal ultrasound that showed low fluid volume.   O: LMP 07/30/2016  GENERAL: Well-developed, well-nourished female in no acute distress.  HEAD: Normocephalic, atraumatic.  CHEST: Normal effort of breathing, regular heart rate ABDOMEN: Soft, nontender, gravid PELVIC: Normal external female genitalia. Vagina is pink and rugated. Cervix with normal contour, no lesions. Normal discharge.  No pooling.   Cervical exam:    Dilation: 3 Effacement (%): 50 Cervical Position: Posterior Station: Ballotable Exam by:: Hannah Ball    Fetal Monitoring: Baseline: 120 Variability: moderate Accelerations: 15x15 Decelerations: none Contractions: irr ctx  No results found for this or any previous visit (from the past 24 hour(s)). Reactive NST No pooling, negative fern Limited ob u/s -- AFI 11  A: SIUP at 3760w4d  Membranes intact  P: Discharge home Discussed reasons to return to MAU Keep f/u with Virgia LandB  Raschelle Wisenbaker, NP 05/03/2017 4:39 PM

## 2017-05-03 NOTE — OB Triage Note (Signed)
Patient given discharge instructions including follow up information, labor precautions, and fetal kick counts. Patient verbalized understanding.

## 2017-05-03 NOTE — Progress Notes (Signed)
Pt came to office to r/o ROM. She states she felt a gush of fluid this morning at 0900 and has had episodes of slow leaking fluid since then with contractions q12-4320min. Bedside US shows very little amniotic fluid. MAU charge/L&D Attending called to inform pt on the way to MAU.

## 2017-05-05 ENCOUNTER — Encounter: Payer: Self-pay | Admitting: *Deleted

## 2017-05-05 ENCOUNTER — Observation Stay
Admission: EM | Admit: 2017-05-05 | Discharge: 2017-05-05 | Disposition: A | Discharge status: Home / Self Care | Payer: Medicaid Other | Attending: Obstetrics and Gynecology | Admitting: Obstetrics and Gynecology

## 2017-05-05 DIAGNOSIS — Z3A39 39 weeks gestation of pregnancy: Secondary | ICD-10-CM | POA: Insufficient documentation

## 2017-05-05 DIAGNOSIS — Z6791 Unspecified blood type, Rh negative: Secondary | ICD-10-CM

## 2017-05-05 DIAGNOSIS — O99210 Obesity complicating pregnancy, unspecified trimester: Secondary | ICD-10-CM

## 2017-05-05 DIAGNOSIS — O9989 Other specified diseases and conditions complicating pregnancy, childbirth and the puerperium: Secondary | ICD-10-CM | POA: Diagnosis not present

## 2017-05-05 DIAGNOSIS — Z3480 Encounter for supervision of other normal pregnancy, unspecified trimester: Secondary | ICD-10-CM

## 2017-05-05 DIAGNOSIS — R109 Unspecified abdominal pain: Secondary | ICD-10-CM | POA: Insufficient documentation

## 2017-05-05 DIAGNOSIS — O26899 Other specified pregnancy related conditions, unspecified trimester: Secondary | ICD-10-CM

## 2017-05-05 DIAGNOSIS — O471 False labor at or after 37 completed weeks of gestation: Secondary | ICD-10-CM | POA: Diagnosis present

## 2017-05-05 NOTE — OB Triage Note (Signed)
CO lost mucous plug. Contracting off and on "for a while now." Hannah Ball, YemenLetitia S

## 2017-05-05 NOTE — Discharge Summary (Signed)
TRIAGE VISIT with NST   [name] is a 26 y.o. G2P1001. She is at 8430w6d gestation. [facility].  Indication: Contractions at term  S: Resting comfortably. no CTX, no VB. Active fetal movement. O:  BP (!) 110/54 (BP Location: Right Arm)   Temp 98 F (36.7 C) (Oral)   Ht 5\' 8"  (1.727 m)   Wt 198 lb (89.8 kg)   LMP 07/30/2016   BMI 30.11 kg/m  No results found for this or any previous visit (from the past 48 hour(s)).   Gen: NAD, AAOx3      Abd: FNTTP      Ext: Non-tender, Nonedmeatous    FHT: 125, mod var, +accels, no decels TOCO: quiet SVE: Dilation: 4 Effacement (%): 50 Cervical Position: Posterior Station: -2 Presentation: Vertex Exam by:: LSE   A/P:  26 y.o. G2P1001 7230w6d with abdominal pain, now resolved.   Labor: not present.   Pt states that she would like to be induced.  Fetal Wellbeing: NST is Reassuring Cat 1 tracing.  D/c home stable, precautions reviewed, follow-up as scheduled.

## 2017-05-07 ENCOUNTER — Ambulatory Visit (INDEPENDENT_AMBULATORY_CARE_PROVIDER_SITE_OTHER): Payer: Medicaid Other | Admitting: Family Medicine

## 2017-05-07 VITALS — BP 107/67 | HR 77 | Wt 197.0 lb

## 2017-05-07 DIAGNOSIS — Z3483 Encounter for supervision of other normal pregnancy, third trimester: Secondary | ICD-10-CM

## 2017-05-07 DIAGNOSIS — M549 Dorsalgia, unspecified: Secondary | ICD-10-CM

## 2017-05-07 DIAGNOSIS — O9989 Other specified diseases and conditions complicating pregnancy, childbirth and the puerperium: Secondary | ICD-10-CM

## 2017-05-07 DIAGNOSIS — Z3480 Encounter for supervision of other normal pregnancy, unspecified trimester: Secondary | ICD-10-CM

## 2017-05-07 DIAGNOSIS — O26893 Other specified pregnancy related conditions, third trimester: Secondary | ICD-10-CM

## 2017-05-07 NOTE — Addendum Note (Signed)
Addended by: [name], JACOB J on: 05/07/2017 06:43 PM   Modules accepted: Orders, SmartSet

## 2017-05-07 NOTE — Progress Notes (Signed)
   PRENATAL VISIT NOTE  Subjective:  Hannah NashJessica L Ball is a 26 y.o. G2P1001 at 6089w1d being seen today for ongoing prenatal care.  She is currently monitored for the following issues for this low-risk pregnancy and has Supervision of normal pregnancy, antepartum; Rh negative, antepartum; Echogenic intracardiac focus of fetus on prenatal ultrasound; Encounter for repeat ultrasound of fetal pyelectasis in singleton pregnancy, antepartum; Obesity in pregnancy; Gestational thrombocytopenia (HCC); Back pain affecting pregnancy; Labor and delivery indication for care or intervention; and Indication for care in labor or delivery on her problem list.  Patient reports backache, fatigue and occasional contractions.  Contractions: Irregular. Vag. Bleeding: None.  Movement: Present. Denies leaking of fluid.   The following portions of the patient's history were reviewed and updated as appropriate: allergies, current medications, past family history, past medical history, past social history, past surgical history and problem list. Problem list updated.  Objective:   Vitals:   05/07/17 1449  BP: 107/67  Pulse: 77  Weight: 197 lb (89.4 kg)    Fetal Status: Fetal Heart Rate (bpm): 120   Movement: Present     General:  Alert, oriented and cooperative. Patient is in no acute distress.  Skin: Skin is warm and dry. No rash noted.   Cardiovascular: Normal heart rate noted  Respiratory: Normal respiratory effort, no problems with respiration noted  Abdomen: Soft, gravid, appropriate for gestational age. Pain/Pressure: Present     Pelvic:  Cervical exam performed        Extremities: Normal range of motion.  Edema: Trace  Mental Status: Normal mood and affect. Normal behavior. Normal judgment and thought content.   Assessment and Plan:  Pregnancy: G2P1001 at 5589w1d  1. Supervision of other normal pregnancy, antepartum FHT and FH normal. Patient had bad experience in MAU with nurse and NP. She was  looking at transferring care to [facility]. While this is her choice, I expressed that I was sorry she didn't have a positive experience and told her that I wouldn't want to lose her as a patient, especially as she has been happy with her care here. She requested induction because she has been feeling miserable with her back pain and cramping. With her being a multigravid patient with a favorable cervix, I think this is a reasonable option. Will induce her tomorrow morning.  2. Back pain affecting pregnancy in third trimester   Term labor symptoms and general obstetric precautions including but not limited to vaginal bleeding, contractions, leaking of fluid and fetal movement were reviewed in detail with the patient. Please refer to After Visit Summary for other counseling recommendations.  No Follow-up on file.   Levie HeritageJacob J Annalee Meyerhoff, DO

## 2017-05-07 NOTE — H&P (Signed)
Faculty Practice H&P  Hannah Ball is a 26 y.o. female G2P1001 with IUP at [redacted]w[redacted]d presenting for postdates induction of labor. Pregnancy was been complicated by back pain in pregnancy, Rh negative status, echogenic focus in LV.    Pt states she has been having occasional contractions, no vaginal bleeding, intact membranes, with normal fetal movement.     Prenatal Course Source of Care: [facility] with onset of care at 10 weeks  Pregnancy complications or risks: Patient Active Problem List   Diagnosis Date Noted  . Labor and delivery indication for care or intervention 05/03/2017  . Back pain affecting pregnancy 04/16/2017  . Obesity in pregnancy 01/18/2017  . Encounter for repeat ultrasound of fetal pyelectasis in singleton pregnancy, antepartum 01/07/2017  . Echogenic intracardiac focus of fetus on prenatal ultrasound 12/21/2016  . Rh negative, antepartum 10/16/2016  . Supervision of normal pregnancy, antepartum 10/10/2016   She desires oral progesterone-only contraceptive for contraception.  She plans to breastfeed  Prenatal labs and studies: ABO, Rh: A/Negative/-- (04/19 1478) Antibody: Negative (04/19 0858) Rubella: !Error! RPR: Non Reactive (04/19 0858)  HBsAg: NEGATIVE (12/12 1005)  HIV: Non Reactive (04/19 0858)  GBS:    2hr Glucola: negative Genetic screening: normal Anatomy US: normal  Past Medical History:  Past Medical History:  Diagnosis Date  . Medical history non-contributory   . Nausea/vomiting in pregnancy 10/26/2016   Trial of diclegis   . Ovarian cyst   . Type A blood, Rh negative     Past Surgical History:  Past Surgical History:  Procedure Laterality Date  . LAPAROSCOPIC OVARIAN CYSTECTOMY    . OVARIAN CYST REMOVAL  2008    Obstetrical History:  OB History    Gravida Para Term Preterm AB Living   2 1 1  0 0 1   SAB TAB Ectopic Multiple Live Births   0 0 0   1      Gynecological History:  OB History    Gravida Para Term Preterm  AB Living   2 1 1  0 0 1   SAB TAB Ectopic Multiple Live Births   0 0 0   1      Social History:  Social History   Social History  . Marital status: Married    Spouse name: N/A  . Number of children: N/A  . Years of education: N/A   Social History Main Topics  . Smoking status: Never Smoker  . Smokeless tobacco: Never Used  . Alcohol use No  . Drug use: No  . Sexual activity: Yes    Partners: Male    Birth control/ protection: None   Other Topics Concern  . Not on file   Social History Narrative   ** Merged History Encounter **        Family History: No family history on file.  Medications:  Prenatal vitamins,  Current Outpatient Prescriptions  Medication Sig Dispense Refill  . acetaminophen (TYLENOL) 500 MG tablet Take 500 mg by mouth every 6 (six) hours as needed for mild pain.    . calcium carbonate (TUMS - DOSED IN MG ELEMENTAL CALCIUM) 500 MG chewable tablet Chew 2 tablets by mouth daily as needed for indigestion or heartburn.    . hydrocortisone (ANUSOL-HC) 2.5 % rectal cream Place 1 application rectally 2 (two) times daily. 30 g 1  . hydrocortisone (ANUSOL-HC) 25 MG suppository Place 1 suppository (25 mg total) rectally 2 (two) times daily. 12 suppository 2   No current facility-administered medications for this  visit.     Allergies:  Allergies  Allergen Reactions  . Benadryl [Diphenhydramine Hcl (Sleep)] Hives  . Latex Hives  . Neosporin Plus Max St Other (See Comments)    Skin burns  . Neosporin Wound Cleanser [Benzalkonium Chloride] Hives  . Penicillins Hives  . Latex Rash  . Penicillins Rash    Has patient had a PCN reaction causing immediate rash, facial/tongue/throat swelling, SOB or lightheadedness with hypotension: Yes Has patient had a PCN reaction causing severe rash involving mucus membranes or skin necrosis: No Has patient had a PCN reaction that required hospitalization No Has patient had a PCN reaction occurring within the last 10 years:  No If all of the above answers are "NO", then may proceed with Cephalosporin use.     Review of Systems: - negative  Physical Exam: Blood pressure 107/67, pulse 77, weight 197 lb (89.4 kg), last menstrual period 07/30/2016. GENERAL: Well-developed, well-nourished female in no acute distress.  LUNGS: Clear to auscultation bilaterally.  HEART: Regular rate and rhythm. ABDOMEN: Soft, nontender, nondistended, gravid. EFW 8#6oz lbs EXTREMITIES: Nontender, no edema, 2+ distal pulses. FHT:  Baseline rate 120 bpm      Pertinent Labs/Studies:   Lab Results  Component Value Date   WBC 6.6 04/27/2017   HGB 9.9 (L) 04/27/2017   HCT 29.0 (L) 04/27/2017   MCV 76.3 (L) 04/27/2017   PLT 165 04/27/2017    Assessment : Hannah Ball is a 26 y.o. G2P1001 at 427w1d being admitted for induction of labor for postdates.  Rh neg GBS neg Anemia  Plan: Induction of labor with pitocin and AROM when head is well applied.  Patient would like to breastfeed Plans on OCPs for contraception. Will need iron for anemia postpartum.   [name], Labria Wos J, DO 05/07/2017, 6:37 PM

## 2017-05-08 ENCOUNTER — Inpatient Hospital Stay (HOSPITAL_COMMUNITY): Payer: Medicaid Other | Admitting: Anesthesiology

## 2017-05-08 ENCOUNTER — Inpatient Hospital Stay (HOSPITAL_COMMUNITY)
Admission: RE | Admit: 2017-05-08 | Discharge: 2017-05-11 | DRG: 775 | Disposition: A | Discharge status: Home / Self Care | Payer: Medicaid Other | Source: Ambulatory Visit | Attending: Family Medicine | Admitting: Family Medicine

## 2017-05-08 ENCOUNTER — Encounter (HOSPITAL_COMMUNITY): Payer: Self-pay

## 2017-05-08 DIAGNOSIS — O480 Post-term pregnancy: Principal | ICD-10-CM | POA: Diagnosis present

## 2017-05-08 DIAGNOSIS — Z880 Allergy status to penicillin: Secondary | ICD-10-CM

## 2017-05-08 DIAGNOSIS — O26893 Other specified pregnancy related conditions, third trimester: Secondary | ICD-10-CM | POA: Diagnosis present

## 2017-05-08 DIAGNOSIS — D649 Anemia, unspecified: Secondary | ICD-10-CM | POA: Diagnosis present

## 2017-05-08 DIAGNOSIS — O9902 Anemia complicating childbirth: Secondary | ICD-10-CM | POA: Diagnosis present

## 2017-05-08 DIAGNOSIS — Z91040 Latex allergy status: Secondary | ICD-10-CM | POA: Diagnosis not present

## 2017-05-08 DIAGNOSIS — O99210 Obesity complicating pregnancy, unspecified trimester: Secondary | ICD-10-CM

## 2017-05-08 DIAGNOSIS — Z6791 Unspecified blood type, Rh negative: Secondary | ICD-10-CM

## 2017-05-08 DIAGNOSIS — O481 Prolonged pregnancy: Secondary | ICD-10-CM | POA: Diagnosis present

## 2017-05-08 DIAGNOSIS — Z3A40 40 weeks gestation of pregnancy: Secondary | ICD-10-CM | POA: Diagnosis not present

## 2017-05-08 DIAGNOSIS — O26899 Other specified pregnancy related conditions, unspecified trimester: Secondary | ICD-10-CM

## 2017-05-08 DIAGNOSIS — Z3480 Encounter for supervision of other normal pregnancy, unspecified trimester: Secondary | ICD-10-CM

## 2017-05-08 LAB — CBC
HCT: 34.6 % — ABNORMAL LOW (ref 36.0–46.0)
Hemoglobin: 10.8 g/dL — ABNORMAL LOW (ref 12.0–15.0)
MCH: 23.3 pg — AB (ref 26.0–34.0)
MCHC: 31.2 g/dL (ref 30.0–36.0)
MCV: 74.7 fL — ABNORMAL LOW (ref 78.0–100.0)
PLATELETS: 146 10*3/uL — AB (ref 150–400)
RBC: 4.63 MIL/uL (ref 3.87–5.11)
RDW: 15.7 % — AB (ref 11.5–15.5)
WBC: 7.4 10*3/uL (ref 4.0–10.5)

## 2017-05-08 LAB — TYPE AND SCREEN
ABO/RH(D): A NEG
Antibody Screen: NEGATIVE

## 2017-05-08 LAB — ABO/RH: ABO/RH(D): A NEG

## 2017-05-08 LAB — RPR: RPR: NONREACTIVE

## 2017-05-08 MED ORDER — OXYTOCIN 40 UNITS IN LACTATED RINGERS INFUSION - SIMPLE MED
1.0000 m[IU]/min | INTRAVENOUS | Status: DC
  Administered 2017-05-08: 2 m[IU]/min via INTRAVENOUS
  Filled 2017-05-08: qty 1000

## 2017-05-08 MED ORDER — ACETAMINOPHEN 325 MG PO TABS: 650.0000 mg | ORAL_TABLET | ORAL | Status: DC | PRN

## 2017-05-08 MED ORDER — LACTATED RINGERS IV SOLN
500.0000 mL | Freq: Once | INTRAVENOUS | Status: AC
  Administered 2017-05-08: 500 mL via INTRAVENOUS

## 2017-05-08 MED ORDER — TERBUTALINE SULFATE 1 MG/ML IJ SOLN
0.2500 mg | Freq: Once | INTRAMUSCULAR | Status: AC | PRN
  Administered 2017-05-08: 0.25 mg via SUBCUTANEOUS
  Filled 2017-05-08: qty 1

## 2017-05-08 MED ORDER — EPHEDRINE 5 MG/ML INJ
10.0000 mg | INTRAVENOUS | Status: DC | PRN
  Filled 2017-05-08: qty 2

## 2017-05-08 MED ORDER — FENTANYL 2.5 MCG/ML BUPIVACAINE 1/10 % EPIDURAL INFUSION (WH - ANES)
14.0000 mL/h | INTRAMUSCULAR | Status: DC | PRN
  Administered 2017-05-08 – 2017-05-09 (×3): 14 mL/h via EPIDURAL
  Filled 2017-05-08 (×3): qty 100

## 2017-05-08 MED ORDER — LIDOCAINE HCL (PF) 1 % IJ SOLN
INTRAMUSCULAR | Status: DC | PRN
  Administered 2017-05-08 (×2): 7 mL via EPIDURAL

## 2017-05-08 MED ORDER — LIDOCAINE HCL (PF) 1 % IJ SOLN
30.0000 mL | INTRAMUSCULAR | Status: DC | PRN
  Filled 2017-05-08: qty 30

## 2017-05-08 MED ORDER — OXYTOCIN 40 UNITS IN LACTATED RINGERS INFUSION - SIMPLE MED: 2.5000 [IU]/h | INTRAVENOUS | Status: DC

## 2017-05-08 MED ORDER — PHENYLEPHRINE 40 MCG/ML (10ML) SYRINGE FOR IV PUSH (FOR BLOOD PRESSURE SUPPORT)
80.0000 ug | PREFILLED_SYRINGE | INTRAVENOUS | Status: DC | PRN
  Filled 2017-05-08: qty 5

## 2017-05-08 MED ORDER — OXYCODONE-ACETAMINOPHEN 5-325 MG PO TABS: 1.0000 | ORAL_TABLET | ORAL | Status: DC | PRN

## 2017-05-08 MED ORDER — OXYTOCIN BOLUS FROM INFUSION
500.0000 mL | Freq: Once | INTRAVENOUS | Status: AC
  Administered 2017-05-09: 500 mL via INTRAVENOUS

## 2017-05-08 MED ORDER — SOD CITRATE-CITRIC ACID 500-334 MG/5ML PO SOLN
30.0000 mL | ORAL | Status: DC | PRN
  Administered 2017-05-08: 30 mL via ORAL
  Filled 2017-05-08: qty 15

## 2017-05-08 MED ORDER — LACTATED RINGERS IV SOLN
500.0000 mL | INTRAVENOUS | Status: DC | PRN
  Administered 2017-05-08: 1000 mL via INTRAVENOUS
  Administered 2017-05-08: 500 mL via INTRAVENOUS
  Administered 2017-05-08: 350 mL via INTRAVENOUS

## 2017-05-08 MED ORDER — FENTANYL CITRATE (PF) 100 MCG/2ML IJ SOLN: 50.0000 ug | INTRAMUSCULAR | Status: DC | PRN

## 2017-05-08 MED ORDER — LACTATED RINGERS IV SOLN
INTRAVENOUS | Status: DC
  Administered 2017-05-08 (×4): via INTRAVENOUS

## 2017-05-08 MED ORDER — PHENYLEPHRINE 40 MCG/ML (10ML) SYRINGE FOR IV PUSH (FOR BLOOD PRESSURE SUPPORT)
80.0000 ug | PREFILLED_SYRINGE | INTRAVENOUS | Status: DC | PRN
  Filled 2017-05-08: qty 5
  Filled 2017-05-08: qty 10

## 2017-05-08 MED ORDER — ONDANSETRON HCL 4 MG/2ML IJ SOLN
4.0000 mg | Freq: Four times a day (QID) | INTRAMUSCULAR | Status: DC | PRN
  Administered 2017-05-08: 4 mg via INTRAVENOUS
  Filled 2017-05-08: qty 2

## 2017-05-08 MED ORDER — OXYCODONE-ACETAMINOPHEN 5-325 MG PO TABS
2.0000 | ORAL_TABLET | ORAL | Status: DC | PRN
  Filled 2017-05-08: qty 2

## 2017-05-08 NOTE — Anesthesia Procedure Notes (Signed)
Epidural Patient location during procedure: OB Start time: 05/08/2017 12:16 PM End time: 05/08/2017 12:20 PM  Staffing Anesthesiologist: Leilani AbleHATCHETT, Yuka Lallier Performed: anesthesiologist   Preanesthetic Checklist Completed: patient identified, surgical consent, pre-op evaluation, timeout performed, IV checked, risks and benefits discussed and monitors and equipment checked  Epidural Patient position: sitting Prep: site prepped and draped and DuraPrep Patient monitoring: continuous pulse ox and blood pressure Approach: midline Location: L3-L4 Injection technique: LOR air  Needle:  Needle type: Tuohy  Needle gauge: 17 G Needle length: 9 cm and 9 Needle insertion depth: 5 cm cm Catheter type: closed end flexible Catheter size: 19 Gauge Catheter at skin depth: 10 cm Test dose: negative and Other  Assessment Sensory level: T9 Events: blood not aspirated, injection not painful, no injection resistance, negative IV test and no paresthesia

## 2017-05-08 NOTE — Anesthesia Preprocedure Evaluation (Signed)
Anesthesia Evaluation  Patient identified by MRN, date of birth, ID band Patient awake    Reviewed: Allergy & Precautions, H&P , NPO status , Patient's Chart, lab work & pertinent test results  Airway Mallampati: I  TM Distance: >3 FB Neck ROM: full    Dental no notable dental hx. (+) Teeth Intact   Pulmonary neg pulmonary ROS,    Pulmonary exam normal breath sounds clear to auscultation       Cardiovascular negative cardio ROS Normal cardiovascular exam Rhythm:regular Rate:Normal     Neuro/Psych negative neurological ROS  negative psych ROS   GI/Hepatic negative GI ROS, Neg liver ROS,   Endo/Other  negative endocrine ROS  Renal/GU negative Renal ROS  negative genitourinary   Musculoskeletal negative musculoskeletal ROS (+)   Abdominal Normal abdominal exam  (+)   Peds  Hematology negative hematology ROS (+)   Anesthesia Other Findings   Reproductive/Obstetrics (+) Pregnancy                             Anesthesia Physical Anesthesia Plan  ASA: II  Anesthesia Plan: Epidural   Post-op Pain Management:    Induction:   PONV Risk Score and Plan:   Airway Management Planned:   Additional Equipment:   Intra-op Plan:   Post-operative Plan:   Informed Consent: I have reviewed the patients History and Physical, chart, labs and discussed the procedure including the risks, benefits and alternatives for the proposed anesthesia with the patient or authorized representative who has indicated his/her understanding and acceptance.       Plan Discussed with:   Anesthesia Plan Comments:         Anesthesia Quick Evaluation  

## 2017-05-08 NOTE — Anesthesia Pain Management Evaluation Note (Signed)
  CRNA Pain Management Visit Note  Patient: Hannah Ball, 26 y.o., female  "Hello I am a member of the anesthesia team at [facility]. We have an anesthesia team available at all times to provide care throughout the hospital, including epidural management and anesthesia for C-section. I don't know your plan for the delivery whether it a natural birth, water birth, IV sedation, nitrous supplementation, doula or epidural, but we want to meet your pain goals."   1.Was your pain managed to your expectations on prior hospitalizations?   Yes   2.What is your expectation for pain management during this hospitalization?     Epidural  3.How can we help you reach that goal? epidural  Record the patient's initial score and the patient's pain goal.   Pain: 7  Pain Goal: 8 [facility] wants you to be able to say your pain was always managed very well.  Trev Boley 05/08/2017

## 2017-05-08 NOTE — H&P (View-Only) (Signed)
Faculty Practice H&P  Hannah Ball is a 26 y.o. female G2P1001 with IUP at [redacted]w[redacted]d presenting for postdates induction of labor. Pregnancy was been complicated by back pain in pregnancy, Rh negative status, echogenic focus in LV.    Pt states she has been having occasional contractions, no vaginal bleeding, intact membranes, with normal fetal movement.     Prenatal Course Source of Care: [facility] with onset of care at 10 weeks  Pregnancy complications or risks: Patient Active Problem List   Diagnosis Date Noted  . Labor and delivery indication for care or intervention 05/03/2017  . Back pain affecting pregnancy 04/16/2017  . Obesity in pregnancy 01/18/2017  . Encounter for repeat ultrasound of fetal pyelectasis in singleton pregnancy, antepartum 01/07/2017  . Echogenic intracardiac focus of fetus on prenatal ultrasound 12/21/2016  . Rh negative, antepartum 10/16/2016  . Supervision of normal pregnancy, antepartum 10/10/2016   She desires oral progesterone-only contraceptive for contraception.  She plans to breastfeed  Prenatal labs and studies: ABO, Rh: A/Negative/-- (04/19 0858) Antibody: Negative (04/19 0858) Rubella: !Error! RPR: Non Reactive (04/19 0858)  HBsAg: NEGATIVE (12/12 1005)  HIV: Non Reactive (04/19 0858)  GBS:    2hr Glucola: negative Genetic screening: normal Anatomy US: normal  Past Medical History:  Past Medical History:  Diagnosis Date  . Medical history non-contributory   . Nausea/vomiting in pregnancy 10/26/2016   Trial of diclegis   . Ovarian cyst   . Type A blood, Rh negative     Past Surgical History:  Past Surgical History:  Procedure Laterality Date  . LAPAROSCOPIC OVARIAN CYSTECTOMY    . OVARIAN CYST REMOVAL  2008    Obstetrical History:  OB History    Gravida Para Term Preterm AB Living   2 1 1 0 0 1   SAB TAB Ectopic Multiple Live Births   0 0 0   1      Gynecological History:  OB History    Gravida Para Term Preterm  AB Living   2 1 1 0 0 1   SAB TAB Ectopic Multiple Live Births   0 0 0   1      Social History:  Social History   Social History  . Marital status: Married    Spouse name: N/A  . Number of children: N/A  . Years of education: N/A   Social History Main Topics  . Smoking status: Never Smoker  . Smokeless tobacco: Never Used  . Alcohol use No  . Drug use: No  . Sexual activity: Yes    Partners: Male    Birth control/ protection: None   Other Topics Concern  . Not on file   Social History Narrative   ** Merged History Encounter **        Family History: No family history on file.  Medications:  Prenatal vitamins,  Current Outpatient Prescriptions  Medication Sig Dispense Refill  . acetaminophen (TYLENOL) 500 MG tablet Take 500 mg by mouth every 6 (six) hours as needed for mild pain.    . calcium carbonate (TUMS - DOSED IN MG ELEMENTAL CALCIUM) 500 MG chewable tablet Chew 2 tablets by mouth daily as needed for indigestion or heartburn.    . hydrocortisone (ANUSOL-HC) 2.5 % rectal cream Place 1 application rectally 2 (two) times daily. 30 g 1  . hydrocortisone (ANUSOL-HC) 25 MG suppository Place 1 suppository (25 mg total) rectally 2 (two) times daily. 12 suppository 2   No current facility-administered medications for this   visit.     Allergies:  Allergies  Allergen Reactions  . Benadryl [Diphenhydramine Hcl (Sleep)] Hives  . Latex Hives  . Neosporin Plus Max St Other (See Comments)    Skin burns  . Neosporin Wound Cleanser [Benzalkonium Chloride] Hives  . Penicillins Hives  . Latex Rash  . Penicillins Rash    Has patient had a PCN reaction causing immediate rash, facial/tongue/throat swelling, SOB or lightheadedness with hypotension: Yes Has patient had a PCN reaction causing severe rash involving mucus membranes or skin necrosis: No Has patient had a PCN reaction that required hospitalization No Has patient had a PCN reaction occurring within the last 10 years:  No If all of the above answers are "NO", then may proceed with Cephalosporin use.     Review of Systems: - negative  Physical Exam: Blood pressure 107/67, pulse 77, weight 197 lb (89.4 kg), last menstrual period 07/30/2016. GENERAL: Well-developed, well-nourished female in no acute distress.  LUNGS: Clear to auscultation bilaterally.  HEART: Regular rate and rhythm. ABDOMEN: Soft, nontender, nondistended, gravid. EFW 8#6oz lbs EXTREMITIES: Nontender, no edema, 2+ distal pulses. FHT:  Baseline rate 120 bpm      Pertinent Labs/Studies:   Lab Results  Component Value Date   WBC 6.6 04/27/2017   HGB 9.9 (L) 04/27/2017   HCT 29.0 (L) 04/27/2017   MCV 76.3 (L) 04/27/2017   PLT 165 04/27/2017    Assessment : Hannah Ball is a 26 y.o. G2P1001 at [redacted]w[redacted]d being admitted for induction of labor for postdates.  Rh neg GBS neg Anemia  Plan: Induction of labor with pitocin and AROM when head is well applied.  Patient would like to breastfeed Plans on OCPs for contraception. Will need iron for anemia postpartum.   Rubi Tooley J, DO 05/07/2017, 6:37 PM      

## 2017-05-08 NOTE — Progress Notes (Signed)
Patient ID: Hannah NashJessica L Young-Garcia, female   DOB: 21-Nov-1990, 26 y.o.   MRN: [id number]  S/p epidural. Comfortable. BP (!) 112/46   Pulse 71   Temp (!) 97.5 F (36.4 C) (Oral)   Resp 17   Ht 5\' 8"  (1.727 m)   Wt 194 lb (88 kg)   LMP 07/30/2016   SpO2 100%   BMI 29.50 kg/m  Category 1 tracing 4-5/70/-2 Head well applied AROM with clear fluid. Continue pit  [name], Jacob J, [place]DO 05/08/2017 12:54 PM

## 2017-05-08 NOTE — Interval H&P Note (Signed)
History and Physical Interval Note:  05/08/2017 10:20 AM  Hannah NashJessica L Ball  has presented today for induction of labor, with the diagnosis of postdates induction. The patient's history has been reviewed, patient examined, no change in status, stable to proceed with induction.  I have reviewed the patient's chart and labs.  Questions were answered to the patient's satisfaction.     Levie HeritageJacob J Stinson

## 2017-05-08 NOTE — Progress Notes (Signed)
Patient Name: Hannah NashJessica L Ball, female   DOB: [birth year], 26 y.o.  MRN: [id number]  Called to pt's room for prolonged decel despite fluid bolus and cessation of pit. Pt rotated to right and left. Continue to have anterior lip. Terbutaline given. FHT return to baseline briefly, then back down to 80s-90s. Attempted to push past cervical lip. Would push past briefly, then anterior lip would return when she stopped pushing. During the pushing attempts, FHT return to baseline with moderate variability and acceleration. Stopped pushing - will rest and allow baby to recover, then rotate on maternal right to see if we can resolve anterior lip, then restart pitocin.  [name], Jacob J, DO 05/08/2017 5:23 PM

## 2017-05-08 NOTE — Progress Notes (Signed)
Patient ID: Hannah NashJessica L Ball, female   DOB: [birth year], 26 y.o.   MRN: [id number]  Comfortable.  Dilation: 9 Effacement (%): 90 Cervical Position: Anterior Station: -1 Presentation: Vertex Exam by:: Lucas MallowKlashley, RNC  Unchanged Cervical exam.  IUPC placed.  Continue pitocin. Category 1 tracing with moderate variability.  [name], [name], DO 05/08/2017 9:35 PM

## 2017-05-08 NOTE — Progress Notes (Signed)
Patient ID: Hannah Ball, female   DOB: 03-30-1991, 26 y.o.   MRN: [id number]  Pt doing well. Having some shaking. No further decels. Category 1 tracing with low baseline (115-120). Accels and moderate variability.  COntinue increasing pitocin. Dilation: 9 Effacement (%): 90 Cervical Position: Anterior Station: -1 Presentation: Vertex Exam by:: stone rnc  [name], Jacob J, DO 05/08/2017 8:07 PM

## 2017-05-09 ENCOUNTER — Encounter (HOSPITAL_COMMUNITY): Payer: Self-pay

## 2017-05-09 DIAGNOSIS — O480 Post-term pregnancy: Secondary | ICD-10-CM

## 2017-05-09 DIAGNOSIS — Z3A40 40 weeks gestation of pregnancy: Secondary | ICD-10-CM

## 2017-05-09 LAB — CBC
HCT: 33.7 % — ABNORMAL LOW (ref 36.0–46.0)
HEMATOCRIT: 32 % — AB (ref 36.0–46.0)
Hemoglobin: 10.6 g/dL — ABNORMAL LOW (ref 12.0–15.0)
Hemoglobin: 10.2 g/dL — ABNORMAL LOW (ref 12.0–15.0)
MCH: 23.3 pg — AB (ref 26.0–34.0)
MCH: 23.6 pg — AB (ref 26.0–34.0)
MCHC: 31.5 g/dL (ref 30.0–36.0)
MCHC: 31.9 g/dL (ref 30.0–36.0)
MCV: 74.2 fL — AB (ref 78.0–100.0)
MCV: 74.1 fL — AB (ref 78.0–100.0)
PLATELETS: 116 10*3/uL — AB (ref 150–400)
PLATELETS: 122 10*3/uL — AB (ref 150–400)
RBC: 4.54 MIL/uL (ref 3.87–5.11)
RBC: 4.32 MIL/uL (ref 3.87–5.11)
RDW: 15.7 % — AB (ref 11.5–15.5)
RDW: 15.7 % — AB (ref 11.5–15.5)
WBC: 10.4 10*3/uL (ref 4.0–10.5)
WBC: 11.7 10*3/uL — ABNORMAL HIGH (ref 4.0–10.5)

## 2017-05-09 MED ORDER — DIBUCAINE 1 % RE OINT: 1.0000 "application " | TOPICAL_OINTMENT | RECTAL | Status: DC | PRN

## 2017-05-09 MED ORDER — SIMETHICONE 80 MG PO CHEW: 80.0000 mg | CHEWABLE_TABLET | ORAL | Status: DC | PRN

## 2017-05-09 MED ORDER — ACETAMINOPHEN 325 MG PO TABS: 650.0000 mg | ORAL_TABLET | ORAL | Status: DC | PRN

## 2017-05-09 MED ORDER — DIPHENHYDRAMINE HCL 25 MG PO CAPS: 25.0000 mg | ORAL_CAPSULE | Freq: Four times a day (QID) | ORAL | Status: DC | PRN

## 2017-05-09 MED ORDER — ZOLPIDEM TARTRATE 5 MG PO TABS: 5.0000 mg | ORAL_TABLET | Freq: Every evening | ORAL | Status: DC | PRN

## 2017-05-09 MED ORDER — IBUPROFEN 600 MG PO TABS
600.0000 mg | ORAL_TABLET | Freq: Four times a day (QID) | ORAL | Status: DC
  Administered 2017-05-09 – 2017-05-11 (×10): 600 mg via ORAL
  Filled 2017-05-09 (×10): qty 1

## 2017-05-09 MED ORDER — WITCH HAZEL-GLYCERIN EX PADS: 1.0000 "application " | MEDICATED_PAD | CUTANEOUS | Status: DC | PRN

## 2017-05-09 MED ORDER — PRENATAL MULTIVITAMIN CH
1.0000 | ORAL_TABLET | Freq: Every day | ORAL | Status: DC
  Administered 2017-05-09 – 2017-05-11 (×3): 1 via ORAL
  Filled 2017-05-09 (×3): qty 1

## 2017-05-09 MED ORDER — BENZOCAINE-MENTHOL 20-0.5 % EX AERO
1.0000 "application " | INHALATION_SPRAY | CUTANEOUS | Status: DC | PRN
  Administered 2017-05-09: 1 via TOPICAL
  Filled 2017-05-09: qty 56

## 2017-05-09 MED ORDER — OXYCODONE-ACETAMINOPHEN 5-325 MG PO TABS
2.0000 | ORAL_TABLET | ORAL | Status: DC | PRN
  Administered 2017-05-09: 2 via ORAL

## 2017-05-09 MED ORDER — TETANUS-DIPHTH-ACELL PERTUSSIS 5-2.5-18.5 LF-MCG/0.5 IM SUSP
0.5000 mL | Freq: Once | INTRAMUSCULAR | Status: DC
Start: 2017-05-10 — End: 2017-05-11

## 2017-05-09 MED ORDER — ONDANSETRON HCL 4 MG PO TABS: 4.0000 mg | ORAL_TABLET | ORAL | Status: DC | PRN

## 2017-05-09 MED ORDER — OXYCODONE-ACETAMINOPHEN 5-325 MG PO TABS: 1.0000 | ORAL_TABLET | ORAL | Status: DC | PRN

## 2017-05-09 MED ORDER — ONDANSETRON HCL 4 MG/2ML IJ SOLN: 4.0000 mg | INTRAMUSCULAR | Status: DC | PRN

## 2017-05-09 MED ORDER — SENNOSIDES-DOCUSATE SODIUM 8.6-50 MG PO TABS
2.0000 | ORAL_TABLET | ORAL | Status: DC
  Administered 2017-05-09 – 2017-05-11 (×2): 2 via ORAL
  Filled 2017-05-09 (×2): qty 2

## 2017-05-09 MED ORDER — COCONUT OIL OIL: 1.0000 "application " | TOPICAL_OIL | Status: DC | PRN

## 2017-05-09 NOTE — Anesthesia Postprocedure Evaluation (Signed)
Anesthesia Post Note  Patient: Hannah Ball  Procedure(s) Performed: * No procedures listed *     Patient location during evaluation: Mother Baby Anesthesia Type: Epidural Level of consciousness: awake, awake and alert, oriented and patient cooperative Pain management: pain level controlled Vital Signs Assessment: post-procedure vital signs reviewed and stable Respiratory status: spontaneous breathing, nonlabored ventilation and respiratory function stable Cardiovascular status: stable Postop Assessment: no headache, no backache, no signs of nausea or vomiting and patient able to bend at knees Anesthetic complications: no    Last Vitals:  Vitals:   05/09/17 0520 05/09/17 0625  BP: (!) 108/49 (!) 94/43  Pulse: 72 66  Resp: 12 12  Temp: 37.3 C 36.8 C    Last Pain:  Vitals:   05/09/17 0625  TempSrc: Axillary  PainSc:    Pain Goal:                 Hiran Leard L

## 2017-05-09 NOTE — Lactation Note (Signed)
This note was copied from a baby's chart. Lactation Consultation Note  Patient Name: Hannah Ball ZOXWR'UToday's Date: 05/09/2017 Reason for consult: Initial assessment (LC [facility] mom to [facility] )  Baby is 8 hours old, and per mom the baby just fed for 15 mins each breast and mom reported swallows.  While feeding noted leakage out of the other breast. Baby STS on moms chest.  Per mom had several breast changes with pregnancy - breast changed in size, areolas darkened and leaking the Last month.  [facility]. ( LC gave mom the resource paper with WIC number in ).  Mother informed of post-discharge support and given phone number to the lactation department, including services for phone call assistance; out-patient appointments; and breastfeeding support group. List of other breastfeeding resources in the community given in the handout. Encouraged mother to call for problems or concerns related to breastfeeding.   Maternal Data Does the patient have breastfeeding experience prior to this delivery?: No  Feeding Feeding Type:  (baby recently  breast fed 15 mins each breast = per mom )  LATCH Score/Interventions                Intervention(s): Breastfeeding basics reviewed     Lactation Tools Discussed/Used WIC Program: No (per mom plans to sign up / Resource sheet given )   Consult Status Consult Status: Follow-up Date: 05/09/17 Follow-up type: In-patient    Hannah SprangMargaret Ann Levette Ball 05/09/2017, 11:47 AM

## 2017-05-10 ENCOUNTER — Encounter (HOSPITAL_COMMUNITY): Payer: Self-pay

## 2017-05-10 MED ORDER — NORETHINDRONE 0.35 MG PO TABS: 1.0000 | ORAL_TABLET | Freq: Every day | ORAL | 11 refills | Status: DC

## 2017-05-10 NOTE — Progress Notes (Signed)
Post Partum Day #1 Subjective: no complaints, up ad lib and tolerating PO; infant under bili lights; breastfeeding going well- good latch; plans on POPs for contraception  Objective: Blood pressure (!) 99/44, pulse (!) 54, temperature 97.9 F (36.6 C), temperature source Oral, resp. rate 18, height 5\' 8"  (1.727 m), weight 88 kg (194 lb), last menstrual period 07/30/2016, SpO2 100 %, unknown if currently breastfeeding.  Physical Exam:  General: alert, cooperative and no distress Lochia: appropriate Uterine Fundus: firm DVT Evaluation: No evidence of DVT seen on physical exam.   Recent Labs  05/09/17 0344 05/09/17 1704  HGB 10.6* 10.2*  HCT 33.7* 32.0*    Assessment/Plan: Plan for discharge tomorrow   LOS: 2 days   Cam HaiSHAW, Christien Frankl CNM 05/10/2017, 7:39 AM

## 2017-05-10 NOTE — Progress Notes (Signed)
Mother did not want to put scds on she said she will be ambulatory.

## 2017-05-10 NOTE — Discharge Summary (Signed)
OB Discharge Summary     Patient Name: Hannah Ball DOB: 09-11-1991 MRN: [id number]  Date of admission: 05/08/2017 Delivering MD: Levie Heritage   Date of discharge: 05/11/2017  Admitting diagnosis: INDUCTION Intrauterine pregnancy: [redacted]w[redacted]d     Secondary diagnosis:  Active Problems:   Prolonged pregnancy, antepartum  Additional problems: none     Discharge diagnosis: Term Pregnancy Delivered                                                                                                Post partum procedures:none  Augmentation: AROM and Pitocin  Complications: None  Hospital course:  Induction of Labor With Vaginal Delivery   26 y.o. yo [id number] at [redacted]w[redacted]d was admitted to the hospital 05/08/2017 for induction of labor.  Indication for induction: Favorable cervix at term.  Patient had an uncomplicated labor course as follows: Membrane Rupture Time/Date: 12:50 PM ,05/08/2017   Intrapartum Procedures: Episiotomy: None [1]                                         Lacerations:  None [1]  Patient had delivery of a Viable infant.  Information for the patient's newborn:  Dereon, Williamsen [[id number]]  Delivery Method: Vag-Vacuum   05/09/2017  Details of delivery can be found in separate delivery note.  Patient had a routine postpartum course. Patient is discharged home 05/11/17.  Physical exam  Vitals:   05/09/17 1829 05/10/17 0541 05/10/17 1807 05/11/17 0500  BP: (!) 100/58 (!) 99/44 (!) 99/37 (!) 90/50  Pulse: 73 (!) 54 64 60  Resp: 18 18 18 18   Temp: 97.7 F (36.5 C) 97.9 F (36.6 C) 98.2 F (36.8 C) 97.8 F (36.6 C)  TempSrc: Axillary Oral Oral Oral  SpO2:      Weight:      Height:       General: alert, cooperative and no distress Lochia: appropriate Uterine Fundus: firm Incision: N/A DVT Evaluation: No evidence of DVT seen on physical exam. Negative Homan's sign. No cords or calf tenderness. Labs: Lab Results  Component Value Date   WBC 11.7  (H) 05/09/2017   HGB 10.2 (L) 05/09/2017   HCT 32.0 (L) 05/09/2017   MCV 74.1 (L) 05/09/2017   PLT 122 (L) 05/09/2017   CMP Latest Ref Rng & Units 04/27/2017  Glucose 65 - 99 mg/dL 130(Q)  BUN 6 - 20 mg/dL 5(L)  Creatinine 6.57 - 1.00 mg/dL 8.46  Sodium 962 - 952 mmol/L 136  Potassium 3.5 - 5.1 mmol/L 3.6  Chloride 101 - 111 mmol/L 104  CO2 22 - 32 mmol/L -  Calcium 8.9 - 10.3 mg/dL -  Total Protein 6.5 - 8.1 g/dL -  Total Bilirubin 0.3 - 1.2 mg/dL -  Alkaline Phos 38 - 841 U/L -  AST 15 - 41 U/L -  ALT 14 - 54 U/L -    Discharge instruction: per After Visit Summary and "Baby and Me Booklet".  After visit meds:  Allergies as  of 05/11/2017      Reactions   Benadryl [diphenhydramine Hcl (sleep)] Hives   Latex Hives   Neosporin Plus Max St Other (See Comments)   Skin burns   Neosporin Wound Cleanser [benzalkonium Chloride] Hives   Penicillins Rash   Has patient had a PCN reaction causing immediate rash, facial/tongue/throat swelling, SOB or lightheadedness with hypotension: Yes Has patient had a PCN reaction causing severe rash involving mucus membranes or skin necrosis: No Has patient had a PCN reaction that required hospitalization No Has patient had a PCN reaction occurring within the last 10 years: No If all of the above answers are "NO", then may proceed with Cephalosporin use.      Medication List    STOP taking these medications   acetaminophen 500 MG tablet Commonly known as:  TYLENOL   calcium carbonate 500 MG chewable tablet Commonly known as:  TUMS - dosed in mg elemental calcium   hydrocortisone 2.5 % rectal cream Commonly known as:  ANUSOL-HC   hydrocortisone 25 MG suppository Commonly known as:  ANUSOL-HC     TAKE these medications   ibuprofen 600 MG tablet Commonly known as:  ADVIL,MOTRIN Take 1 tablet (600 mg total) by mouth every 6 (six) hours.   norethindrone 0.35 MG tablet Commonly known as:  ORTHO MICRONOR Take 1 tablet (0.35 mg total) by  mouth daily.       Diet: home with mother  Activity: Advance as tolerated. Pelvic rest for 6 weeks.   Outpatient follow up:4 weeks Follow up Appt:No future appointments. Follow up Visit:No Follow-up on file.  Postpartum contraception: Progesterone only pills  Newborn Data: Live born female  Birth Weight: 10 lb 12.1 oz (4880 g) APGAR: 8, 9  Baby Feeding: Breast Disposition:home with mother   05/11/2017 Greig RightRESENZO-DISHMAN,Mantaj Chamberlin, CNM

## 2017-05-10 NOTE — Discharge Instructions (Signed)
Postpartum Care After Vaginal Delivery °The period of time right after you deliver your newborn is called the postpartum period. °What kind of medical care will I receive? °· You may continue to receive fluids and medicines through an IV tube inserted into one of your veins. °· If an incision was made near your vagina (episiotomy) or if you had some vaginal tearing during delivery, cold compresses may be placed on your episiotomy or your tear. This helps to reduce pain and swelling. °· You may be given a squirt bottle to use when you go to the bathroom. You may use this until you are comfortable wiping as usual. To use the squirt bottle, follow these steps: °? Before you urinate, fill the squirt bottle with warm water. Do not use hot water. °? After you urinate, while you are sitting on the toilet, use the squirt bottle to rinse the area around your urethra and vaginal opening. This rinses away any urine and blood. °? You may do this instead of wiping. As you start healing, you may use the squirt bottle before wiping yourself. Make sure to wipe gently. °? Fill the squirt bottle with clean water every time you use the bathroom. °· You will be given sanitary pads to wear. °How can I expect to feel? °· You may not feel the need to urinate for several hours after delivery. °· You will have some soreness and pain in your abdomen and vagina. °· If you are breastfeeding, you may have uterine contractions every time you breastfeed for up to several weeks postpartum. Uterine contractions help your uterus return to its normal size. °· It is normal to have vaginal bleeding (lochia) after delivery. The amount and appearance of lochia is often similar to a menstrual period in the first week after delivery. It will gradually decrease over the next few weeks to a dry, yellow-brown discharge. For most women, lochia stops completely by 6-8 weeks after delivery. Vaginal bleeding can vary from woman to woman. °· Within the first few  days after delivery, you may have breast engorgement. This is when your breasts feel heavy, full, and uncomfortable. Your breasts may also throb and feel hard, tightly stretched, warm, and tender. After this occurs, you may have milk leaking from your breasts. Your health care provider can help you relieve discomfort due to breast engorgement. Breast engorgement should go away within a few days. °· You may feel more sad or worried than normal due to hormonal changes after delivery. These feelings should not last more than a few days. If these feelings do not go away after several days, speak with your health care provider. °How should I care for myself? °· Tell your health care provider if you have pain or discomfort. °· Drink enough water to keep your urine clear or pale yellow. °· Wash your hands thoroughly with soap and water for at least 20 seconds after changing your sanitary pads, after using the toilet, and before holding or feeding your baby. °· If you are not breastfeeding, avoid touching your breasts a lot. Doing this can make your breasts produce more milk. °· If you become weak or lightheaded, or you feel like you might faint, ask for help before: °? Getting out of bed. °? Showering. °· Change your sanitary pads frequently. Watch for any changes in your flow, such as a sudden increase in volume, a change in color, the passing of large blood clots. If you pass a blood clot from your vagina, save it   to show to your health care provider. Do not flush blood clots down the toilet without having your health care provider look at them. °· Make sure that all your vaccinations are up to date. This can help protect you and your baby from getting certain diseases. You may need to have immunizations done before you leave the hospital. °· If desired, talk with your health care provider about methods of family planning or birth control (contraception). °How can I start bonding with my baby? °Spending as much time as  possible with your baby is very important. During this time, you and your baby can get to know each other and develop a bond. Having your baby stay with you in your room (rooming in) can give you time to get to know your baby. Rooming in can also help you become comfortable caring for your baby. Breastfeeding can also help you bond with your baby. °How can I plan for returning home with my baby? °· Make sure that you have a car seat installed in your vehicle. °? Your car seat should be checked by a certified car seat installer to make sure that it is installed safely. °? Make sure that your baby fits into the car seat safely. °· Ask your health care provider any questions you have about caring for yourself or your baby. Make sure that you are able to contact your health care provider with any questions after leaving the hospital. °This information is not intended to replace advice given to you by your health care provider. Make sure you discuss any questions you have with your health care provider. °Document Released: 08/13/2007 Document Revised: 03/20/2016 Document Reviewed: 09/20/2015 °Elsevier Interactive Patient Education © 2018 Elsevier Inc. ° °

## 2017-05-11 ENCOUNTER — Ambulatory Visit: Payer: Self-pay

## 2017-05-11 MED ORDER — IBUPROFEN 600 MG PO TABS
600.0000 mg | ORAL_TABLET | Freq: Four times a day (QID) | ORAL | 0 refills | Status: DC
Start: 2017-05-11 — End: 2017-08-06

## 2017-05-11 MED ORDER — NORETHINDRONE 0.35 MG PO TABS: 1.0000 | ORAL_TABLET | Freq: Every day | ORAL | 11 refills | Status: DC

## 2017-05-11 NOTE — Lactation Note (Signed)
This note was copied from a baby's chart. Lactation Consultation Note  Patient Name: Hannah Ball WJXBJ'YToday's Date: 05/11/2017   Visited with Mom, baby 3260 hrs old and is on double phototherapy.  Bilirubin increased to 14.8.  Baby at 9.6% weight loss, output good.  Baby exclusively being breastfed.  Latch score by RN this am 7 for sleepy at the breast.  (previous latch score 10 on day 2).  Mom had just fed baby and states she hears baby swallow, and breast feels softer afterwards. Consequences of weight loss and elevated bilirubin on breastfeeding discussed.  Reassured Mom that this was just temporary, but pumping and feeding baby her EBM would be recommended to avoid further weight loss, and increased bilirubin. Set up DEBP at bedside with recommendation to pump both breasts after breastfeeding.  Encouraged Mom to do breast massage and hand expression along with double pumping.  Explained about different pump flange sizes, and asked about watching her pump for first time, but Mom said she is "just fine".  Described what a good fit on the flange would be like. Offered to assist and assess latch.  Encouraged Mom to call at next feeding, and LC to follow up in am. Mom stated she will let us know if she needed us.  Judee ClaraSmith, Delrae Hagey E 05/11/2017, 5:00 PM

## 2017-05-12 ENCOUNTER — Ambulatory Visit: Payer: Self-pay

## 2017-05-12 NOTE — Lactation Note (Signed)
This note was copied from a baby's chart. Lactation Consultation Note  Baby 3780 hours old with increased bilirubin.  9.7% weight loss on double phototherapy. Discussed increasing volume by pumping to q 2 hours and limiting time at the breast. Suggest possibly incorporating slow flow nipple for larger volume. Mother states at this time she prefers to use syringe with finger. Mother states she has crack at base of nipple.  Provided coconut oil with instructions. Mom has my # to call for assist w/next feeding to check latch due to soreness.    Patient Name: Boy Corky DownsJessica Young-Garcia VHQIO'NToday's Date: 05/12/2017 Reason for consult: Follow-up assessment   Maternal Data    Feeding    LATCH Score/Interventions                      Lactation Tools Discussed/Used     Consult Status Consult Status: Follow-up Date: 05/13/17 Follow-up type: In-patient    Dahlia ByesBerkelhammer, Ruth [facility] 05/12/2017, 11:15 AM

## 2017-05-13 ENCOUNTER — Ambulatory Visit: Payer: Self-pay

## 2017-05-13 NOTE — Lactation Note (Signed)
This note was copied from a baby's chart. Lactation Consultation Note  Patient Name: Hannah Corky DownsJessica Young-Garcia WUJWJ'XToday's Date: 05/13/2017  Mom's milk is in.  Baby's weight has increased 1 %.  Baby now at a 9% weight loss.  Mom still post pumping and syringe feeding 30 mls.  Instructed to continue this after discharge the first few days.  Outpatient services and support reviewed and encouraged prn.   Maternal Data    Feeding    LATCH Score/Interventions                      Lactation Tools Discussed/Used     Consult Status      Huston FoleyMOULDEN, Ayano Douthitt S 05/13/2017, 9:19 AM

## 2017-06-06 ENCOUNTER — Encounter: Payer: Self-pay | Admitting: Family Medicine

## 2017-06-06 ENCOUNTER — Ambulatory Visit (INDEPENDENT_AMBULATORY_CARE_PROVIDER_SITE_OTHER): Payer: Medicaid Other | Admitting: Family Medicine

## 2017-06-06 VITALS — BP 106/70 | HR 82 | Ht 69.0 in | Wt 174.0 lb

## 2017-06-06 DIAGNOSIS — N61 Mastitis without abscess: Secondary | ICD-10-CM | POA: Diagnosis not present

## 2017-06-06 MED ORDER — CEPHALEXIN 500 MG PO CAPS: 500.0000 mg | ORAL_CAPSULE | Freq: Four times a day (QID) | ORAL | 0 refills | Status: AC

## 2017-06-06 NOTE — Patient Instructions (Signed)
Drain breast every 2 hours. Feed on left first and then pump after every feed.  Dangle feed Start antibiotic on Friday if pain not improved Ok to give baby breast milk     Mastitis Mastitis is redness, soreness, and puffiness (inflammation) in an area of the breast. It is often caused by an infection that occurs when bacteria enter the skin. The infection is often helped by antibiotic medicine. Follow these instructions at home:  Only take medicines as told by your doctor.  If your doctor prescribed an antibiotic medicine, take it as told. Finish it even if you start to feel better.  Do not wear a tight or underwire bra. Wear a soft support bra.  Drink more fluids, especially if you have a fever.  If you are breastfeeding: ? Keep emptying the breast. Your doctor can tell you if the milk is safe. Use a breast pump if you are told to stop nursing. ? Keep your nipples clean and dry. ? Empty the first breast before going to the other breast. Use a breast pump if your baby is not emptying your breast. ? If you go back to work, pump your breasts while at work. ? Avoid letting your breasts get overly filled with milk (engorged). Contact a doctor if:  You have pus-like fluid leaking from your breast.  Your symptoms do not get better within 2 days. Get help right away if:  Your pain and puffiness are getting worse.  Your pain is not helped by medicine.  You have a red line going from your breast toward your armpit.  You have a fever or lasting symptoms for more than 2-3 days.  You have a fever and your symptoms suddenly get worse. This information is not intended to replace advice given to you by your health care provider. Make sure you discuss any questions you have with your health care provider. Document Released: 10/04/2009 Document Revised: 03/23/2016 Document Reviewed: 05/16/2013 Elsevier Interactive Patient Education  2017 [employer]

## 2017-06-06 NOTE — Progress Notes (Signed)
   CLINIC ENCOUNTER NOTE  History:  26 y.o. [id number] here today for left breast pain. Started about 1 week ago, located in the right upp quadrant of left breast. Denies fevers, chils, nausea/vomiting. Pumping every 4-5 hours. Typically gets 2oz from right and 5oz from left. Has tried hot shower and breast massage which helped sx.  Reports she stopped breastfeeding when the pain started. She does not want to terminate her breastfeeding but was worried about infection going to the baby.   She denies any abnormal vaginal discharge, bleeding, pelvic pain or other concerns. ROS neg except those previously stated  Past Medical History:  Diagnosis Date  . Medical history non-contributory   . Nausea/vomiting in pregnancy 10/26/2016   Trial of diclegis   . Ovarian cyst   . Type A blood, Rh negative     Past Surgical History:  Procedure Laterality Date  . LAPAROSCOPIC OVARIAN CYSTECTOMY    . OVARIAN CYST REMOVAL  2008    The following portions of the patient's history were reviewed and updated as appropriate: allergies, current medications, past family history, past medical history, past social history, past surgical history and problem list.    Review of Systems:  Pertinent items noted in HPI and remainder of comprehensive ROS otherwise negative.   Objective:  Physical Exam BP 106/70   Pulse 82   Ht 5\' 9"  (1.753 m)   Wt 174 lb (78.9 kg)   Breastfeeding? No   BMI 25.70 kg/m  Gen: well appearing, non-toxic. Breast: symmetric. No skin changes. Tender nodule on left breast, right upper quadrant. No overlying erythema   Labs and Imaging No results found.  Assessment & Plan:  1. Possibly early Mastitis, left, acute - Recommended increased frequency of breast draining by baby or pump every 2 hours. Recommended pumping after breastfeeding.  -Recommended dangle feeding infant and breast massage -Discussed that if pain not improved by Friday to take abx -Reviewed PCN allergy and patient  has never tried cephalosporin. Reviewed risk of cross reactivity and patient patient agrees to try - cephALEXin (KEFLEX) 500 MG capsule; Take 1 capsule (500 mg total) by mouth 4 (four) times daily.  Dispense: 40 capsule; Refill: 0   Routine preventative health maintenance measures emphasized. Please refer to After Visit Summary for other counseling recommendations.   Return if symptoms worsen or fail to improve.   Total face-to-face time with patient: 25 minutes. Over 50% of encounter was spent on counseling and coordination of care.

## 2017-06-18 NOTE — Progress Notes (Signed)
Post Partum Exam  Hannah Ball is a 26 y.o. G92P2002 female who presents for a postpartum visit. She is 6 weeks postpartum following a spontaneous vaginal delivery. I have fully reviewed the prenatal and intrapartum course. The delivery was at term.  Anesthesia: epidural. Postpartum course has been uncomplicated except for mastitis/clogged duct 2 weeks ago and patient stopped breastfeeding. Baby's course has been uncomplicated. Baby is feeding by formula. Bleeding no bleeding. Bowel function is normal. Bladder function is normal. Patient is sexually active. Contraception method is condoms. Postpartum depression screening:neg  The following portions of the patient's history were reviewed and updated as appropriate: allergies, current medications, past family history, past medical history, past social history, past surgical history and problem list.  Review of Systems Pertinent items are noted in HPI.    Objective:  not currently breastfeeding.  General:  alert, cooperative and appears stated age   Breasts:  inspection negative, no nipple discharge or bleeding, no masses or nodularity palpable  Lungs: clear to auscultation bilaterally  Heart:  regular rate and rhythm, S1, S2 normal, no murmur, click, rub or gallop  Abdomen: soft, non-tender; bowel sounds normal; no masses,  no organomegaly   Vulva:  normal  Vagina: normal vagina  Cervix:  multiparous appearance  Corpus: not examined  Adnexa:  normal adnexa  Rectal Exam: Not performed.        Assessment:    Normal postpartum exam. Pap smear done at today's visit.   Plan:   1. Contraception: Condom broke on Sat 8/18. Discussed negative PT today but patient needs to take PT at home in 2 weeks. Recommended abstaining until this time. If UPT is negative patient given Rx for Loestrin Fe to start. She is agreement with the plan.  2. Mood- wnl. No concerns today 3. Infant feeding: was breastfeeding but stopped when she had a clogged  duct despite reassurance that it was safe to continue. She reports infant has had gastrointestinal issues and she did not want to switch between formula and breastmillk.   Follow up in: PRN

## 2017-06-20 ENCOUNTER — Ambulatory Visit (INDEPENDENT_AMBULATORY_CARE_PROVIDER_SITE_OTHER): Payer: Medicaid Other | Admitting: Family Medicine

## 2017-06-20 ENCOUNTER — Encounter: Payer: Self-pay | Admitting: Family Medicine

## 2017-06-20 DIAGNOSIS — Z3202 Encounter for pregnancy test, result negative: Secondary | ICD-10-CM

## 2017-06-20 LAB — POCT URINE PREGNANCY: Preg Test, Ur: NEGATIVE

## 2017-06-20 MED ORDER — NORETHIN ACE-ETH ESTRAD-FE 1-20 MG-MCG PO TABS: 1.0000 | ORAL_TABLET | Freq: Every day | ORAL | 11 refills | Status: DC

## 2017-08-06 ENCOUNTER — Other Ambulatory Visit (HOSPITAL_COMMUNITY)
Admission: RE | Admit: 2017-08-06 | Discharge: 2017-08-06 | Disposition: A | Discharge status: Home / Self Care | Payer: Medicaid Other | Source: Ambulatory Visit | Attending: Obstetrics & Gynecology | Admitting: Obstetrics & Gynecology

## 2017-08-06 ENCOUNTER — Other Ambulatory Visit: Payer: Self-pay | Admitting: *Deleted

## 2017-08-06 ENCOUNTER — Encounter: Payer: Self-pay | Admitting: *Deleted

## 2017-08-06 DIAGNOSIS — Z3480 Encounter for supervision of other normal pregnancy, unspecified trimester: Secondary | ICD-10-CM | POA: Insufficient documentation

## 2017-08-06 MED ORDER — DOXYLAMINE-PYRIDOXINE ER 20-20 MG PO TBCR: 1.0000 | EXTENDED_RELEASE_TABLET | ORAL | 6 refills | Status: DC

## 2017-08-06 NOTE — Progress Notes (Signed)
PRENATAL INTAKE SUMMARY  Ms. Dady presents today New OB Nurse Interview.  OB History    Gravida Para Term Preterm AB Living   0 0 2   SAB TAB Ectopic Multiple Live Births   0 0 0 0 2     I have reviewed the patient's medical, obstetrical, social, and family histories, medications, and available lab results.  SUBJECTIVE She has no unusual complaints  OBJECTIVE Initial Physical Exam (New OB)  GENERAL APPEARANCE: alert, well appearing, in no apparent distress, oriented to person, place and time   ASSESSMENT Normal pregnancy  PLAN Prenatal care at [facility] Pnl/HIV  OB Urine Culture GC/CT HgbEval SMA

## 2017-08-07 LAB — GC/CHLAMYDIA PROBE AMP (~~LOC~~) NOT AT ARMC
Chlamydia: NEGATIVE
Neisseria Gonorrhea: NEGATIVE

## 2017-08-07 NOTE — Progress Notes (Signed)
Agree with nursing staff's documentation of this patient's clinic encounter.  Keierra Nudo, MD    

## 2017-08-08 ENCOUNTER — Telehealth: Payer: Self-pay | Admitting: *Deleted

## 2017-08-08 DIAGNOSIS — Z760 Encounter for issue of repeat prescription: Secondary | ICD-10-CM

## 2017-08-08 MED ORDER — PREPLUS 27-1 MG PO TABS: 1.0000 | ORAL_TABLET | Freq: Every day | ORAL | 13 refills | Status: DC

## 2017-08-08 NOTE — Telephone Encounter (Signed)
Pt needs refill on prenatal vitamins

## 2017-08-13 LAB — SMN1 COPY NUMBER ANALYSIS (SMA CARRIER SCREENING)

## 2017-08-13 LAB — OBSTETRIC PANEL, INCLUDING HIV
Antibody Screen: NEGATIVE
BASOS ABS: 0 10*3/uL (ref 0.0–0.2)
Basos: 0 %
EOS (ABSOLUTE): 0.2 10*3/uL (ref 0.0–0.4)
Eos: 3 %
HEP B S AG: NEGATIVE
HIV SCREEN 4TH GENERATION: NONREACTIVE
Hematocrit: 41.2 % (ref 34.0–46.6)
Hemoglobin: 13 g/dL (ref 11.1–15.9)
IMMATURE GRANULOCYTES: 0 %
Immature Grans (Abs): 0 10*3/uL (ref 0.0–0.1)
LYMPHS ABS: 1.5 10*3/uL (ref 0.7–3.1)
Lymphs: 31 %
MCH: 24.3 pg — AB (ref 26.6–33.0)
MCHC: 31.6 g/dL (ref 31.5–35.7)
MCV: 77 fL — ABNORMAL LOW (ref 79–97)
Monocytes Absolute: 0.3 10*3/uL (ref 0.1–0.9)
Monocytes: 5 %
NEUTROS PCT: 61 %
Neutrophils Absolute: 2.9 10*3/uL (ref 1.4–7.0)
PLATELETS: 171 10*3/uL (ref 150–379)
RBC: 5.36 x10E6/uL — AB (ref 3.77–5.28)
RDW: 16.7 % — AB (ref 12.3–15.4)
RPR: NONREACTIVE
Rh Factor: NEGATIVE
Rubella Antibodies, IGG: <0.9 index — ABNORMAL LOW (ref >0.99)
WBC: 4.8 10*3/uL (ref 3.4–10.8)

## 2017-08-13 LAB — HEMOGLOBINOPATHY EVALUATION
HEMOGLOBIN A2 QUANTITATION: 1.8 % (ref 1.8–3.2)
HGB C: 0 %
HGB S: 0 %
HGB VARIANT: 8.2 % — AB
Hemoglobin F Quantitation: 0 % (ref 0.0–2.0)
Hgb A: 90 % — ABNORMAL LOW (ref 96.4–98.8)

## 2017-08-13 LAB — CULTURE, OB URINE

## 2017-08-13 LAB — URINE CULTURE, OB REFLEX

## 2017-08-14 ENCOUNTER — Encounter: Payer: Self-pay | Admitting: Obstetrics & Gynecology

## 2017-08-14 DIAGNOSIS — Z283 Underimmunization status: Secondary | ICD-10-CM | POA: Insufficient documentation

## 2017-08-14 DIAGNOSIS — O09899 Supervision of other high risk pregnancies, unspecified trimester: Secondary | ICD-10-CM

## 2017-08-14 DIAGNOSIS — O9989 Other specified diseases and conditions complicating pregnancy, childbirth and the puerperium: Secondary | ICD-10-CM

## 2017-08-14 DIAGNOSIS — Z2839 Other underimmunization status: Secondary | ICD-10-CM

## 2017-08-14 HISTORY — DX: Other underimmunization status: Z28.39

## 2017-08-14 HISTORY — DX: Supervision of other high risk pregnancies, unspecified trimester: O09.899

## 2017-08-29 ENCOUNTER — Encounter: Payer: Self-pay | Admitting: Family Medicine

## 2017-08-29 ENCOUNTER — Ambulatory Visit (INDEPENDENT_AMBULATORY_CARE_PROVIDER_SITE_OTHER): Payer: Medicaid Other | Admitting: Family Medicine

## 2017-08-29 VITALS — BP 108/70 | HR 102 | Wt 184.2 lb

## 2017-08-29 DIAGNOSIS — Z3481 Encounter for supervision of other normal pregnancy, first trimester: Secondary | ICD-10-CM | POA: Diagnosis not present

## 2017-08-29 DIAGNOSIS — O09899 Supervision of other high risk pregnancies, unspecified trimester: Secondary | ICD-10-CM

## 2017-08-29 DIAGNOSIS — Z3687 Encounter for antenatal screening for uncertain dates: Secondary | ICD-10-CM

## 2017-08-29 DIAGNOSIS — Z2839 Other underimmunization status: Secondary | ICD-10-CM

## 2017-08-29 DIAGNOSIS — O9989 Other specified diseases and conditions complicating pregnancy, childbirth and the puerperium: Secondary | ICD-10-CM

## 2017-08-29 DIAGNOSIS — Z283 Underimmunization status: Secondary | ICD-10-CM

## 2017-08-29 DIAGNOSIS — Z3480 Encounter for supervision of other normal pregnancy, unspecified trimester: Secondary | ICD-10-CM

## 2017-08-29 NOTE — Progress Notes (Signed)
   PRENATAL VISIT NOTE  Subjective:  Hannah Ball is a 26 y.o. G3P2002 at 3074w5d being seen today for ongoing prenatal care.  She is currently monitored for the following issues for this low-risk pregnancy and has Rh negative, antepartum; Supervision of other normal pregnancy, antepartum; and Rubella non-immune status, antepartum on her problem list.  Patient reports no complaints.   .  .  Movement: Absent. Denies leaking of fluid.   The following portions of the patient's history were reviewed and updated as appropriate: allergies, current medications, past family history, past medical history, past social history, past surgical history and problem list. Problem list updated.  Objective:   Vitals:   08/29/17 0843  BP: 108/70  Pulse: (!) 102  Weight: 184 lb 3.2 oz (83.6 kg)    Fetal Status: Fetal Heart Rate (bpm): 143   Movement: Absent     General:  Alert, oriented and cooperative. Patient is in no acute distress.  Skin: Skin is warm and dry. No rash noted.   Cardiovascular: Normal heart rate noted  Respiratory: Normal respiratory effort, no problems with respiration noted  Abdomen: Soft, gravid, appropriate for gestational age.  Pain/Pressure: Absent     Pelvic: Cervical exam deferred        Extremities: Normal range of motion.     Mental Status:  Normal mood and affect. Normal behavior. Normal judgment and thought content.   Assessment and Plan:  Pregnancy: G3P2002 at 374w5d  1. Supervision of other normal pregnancy, antepartum - Desires NT testing - Discussed practice model - Reviewed infant feeding and contraception - Short interval pregnancy discussed-- patient increased risk of PTB and low birthweight - Hemoglobin A1c - CMP and Liver - Babyscripts Schedule Optimization  2. Rubella non-immune status, antepartum - this pregnancy but was Rubella immune is last pregnancy  3. Rh negative  Preterm labor symptoms and general obstetric precautions including but not  limited to vaginal bleeding, contractions, leaking of fluid and fetal movement were reviewed in detail with the patient. Please refer to After Visit Summary for other counseling recommendations.  No Follow-up on file.   Federico FlakeKimberly Niles Thaddeus Evitts, MD

## 2017-08-29 NOTE — Progress Notes (Signed)
DATING AND VIABILITY SONOGRAM   Hannah Ball is a 26 y.o. year old 673P2002 with LMP Patient's last menstrual period was 06/30/2017. which would correlate to  18442w5d weeks gestation.  She has irregular menstrual cycles.   She is here today for a confirmatory initial sonogram.    GESTATION: SINGLETON     FETAL ACTIVITY:          Heart rate: 143bpm          The fetus is active.   GESTATIONAL AGE AND  BIOMETRICS:  Gestational criteria: Estimated Date of Delivery: 04/19/18 by early ultrasound now at 74442w5d  Previous Scans:0  CROWN RUMP LENGTH 0.818 cm 65442w5d                                                   AVERAGE EGA(BY THIS SCAN):  6.5 weeks  WORKING EDD( early ultrasound ):  04/19/18     TECHNICIAN COMMENTS:  SLIUP measuring 6.5wk by CRL and FHR 143bpm.    A copy of this report including all images has been saved and backed up to a second source for retrieval if needed. All measures and details of the anatomical scan, placentation, fluid volume and pelvic anatomy are contained in that report.  Aasir Daigler 08/29/2017 9:00 AM

## 2017-08-29 NOTE — Patient Instructions (Signed)
First Trimester of Pregnancy The first trimester of pregnancy is from week 1 until the end of week 13 (months 1 through 3). A week after a sperm fertilizes an egg, the egg will implant on the wall of the uterus. This embryo will begin to develop into a baby. Genes from you and your partner will form the baby. The female genes will determine whether the baby will be a boy or a girl. At 6-8 weeks, the eyes and face will be formed, and the heartbeat can be seen on ultrasound. At the end of 12 weeks, all the baby's organs will be formed. Now that you are pregnant, you will want to do everything you can to have a healthy baby. Two of the most important things are to get good prenatal care and to follow your health care provider's instructions. Prenatal care is all the medical care you receive before the baby's birth. This care will help prevent, find, and treat any problems during the pregnancy and childbirth. Body changes during your first trimester Your body goes through many changes during pregnancy. The changes vary from woman to woman.  You may gain or lose a couple of pounds at first.  You may feel sick to your stomach (nauseous) and you may throw up (vomit). If the vomiting is uncontrollable, call your health care provider.  You may tire easily.  You may develop headaches that can be relieved by medicines. All medicines should be approved by your health care provider.  You may urinate more often. Painful urination may mean you have a bladder infection.  You may develop heartburn as a result of your pregnancy.  You may develop constipation because certain hormones are causing the muscles that push stool through your intestines to slow down.  You may develop hemorrhoids or swollen veins (varicose veins).  Your breasts may begin to grow larger and become tender. Your nipples may stick out more, and the tissue that surrounds them (areola) may become darker.  Your gums may bleed and may be  sensitive to brushing and flossing.  Dark spots or blotches (chloasma, mask of pregnancy) may develop on your face. This will likely fade after the baby is born.  Your menstrual periods will stop.  You may have a loss of appetite.  You may develop cravings for certain kinds of food.  You may have changes in your emotions from day to day, such as being excited to be pregnant or being concerned that something may go wrong with the pregnancy and baby.  You may have more vivid and strange dreams.  You may have changes in your hair. These can include thickening of your hair, rapid growth, and changes in texture. Some women also have hair loss during or after pregnancy, or hair that feels dry or thin. Your hair will most likely return to normal after your baby is born.  What to expect at prenatal visits During a routine prenatal visit:  You will be weighed to make sure you and the baby are growing normally.  Your blood pressure will be taken.  Your abdomen will be measured to track your baby's growth.  The fetal heartbeat will be listened to between weeks 10 and 14 of your pregnancy.  Test results from any previous visits will be discussed.  Your health care provider may ask you:  How you are feeling.  If you are feeling the baby move.  If you have had any abnormal symptoms, such as leaking fluid, bleeding, severe headaches,   or abdominal cramping.  If you are using any tobacco products, including cigarettes, chewing tobacco, and electronic cigarettes.  If you have any questions.  Other tests that may be performed during your first trimester include:  Blood tests to find your blood type and to check for the presence of any previous infections. The tests will also be used to check for low iron levels (anemia) and protein on red blood cells (Rh antibodies). Depending on your risk factors, or if you previously had diabetes during pregnancy, you may have tests to check for high blood  sugar that affects pregnant women (gestational diabetes).  Urine tests to check for infections, diabetes, or protein in the urine.  An ultrasound to confirm the proper growth and development of the baby.  Fetal screens for spinal cord problems (spina bifida) and Down syndrome.  HIV (human immunodeficiency virus) testing. Routine prenatal testing includes screening for HIV, unless you choose not to have this test.  You may need other tests to make sure you and the baby are doing well.  Follow these instructions at home: Medicines  Follow your health care provider's instructions regarding medicine use. Specific medicines may be either safe or unsafe to take during pregnancy.  Take a prenatal vitamin that contains at least 600 micrograms (mcg) of folic acid.  If you develop constipation, try taking a stool softener if your health care provider approves. Eating and drinking  Eat a balanced diet that includes fresh fruits and vegetables, whole grains, good sources of protein such as meat, eggs, or tofu, and low-fat dairy. Your health care provider will help you determine the amount of weight gain that is right for you.  Avoid raw meat and uncooked cheese. These carry germs that can cause birth defects in the baby.  Eating four or five small meals rather than three large meals a day may help relieve nausea and vomiting. If you start to feel nauseous, eating a few soda crackers can be helpful. Drinking liquids between meals, instead of during meals, also seems to help ease nausea and vomiting.  Limit foods that are high in fat and processed sugars, such as fried and sweet foods.  To prevent constipation: ? Eat foods that are high in fiber, such as fresh fruits and vegetables, whole grains, and beans. ? Drink enough fluid to keep your urine clear or pale yellow. Activity  Exercise only as directed by your health care provider. Most women can continue their usual exercise routine during  pregnancy. Try to exercise for 30 minutes at least 5 days a week. Exercising will help you: ? Control your weight. ? Stay in shape. ? Be prepared for labor and delivery.  Experiencing pain or cramping in the lower abdomen or lower back is a good sign that you should stop exercising. Check with your health care provider before continuing with normal exercises.  Try to avoid standing for long periods of time. Move your legs often if you must stand in one place for a long time.  Avoid heavy lifting.  Wear low-heeled shoes and practice good posture.  You may continue to have sex unless your health care provider tells you not to. Relieving pain and discomfort  Wear a good support bra to relieve breast tenderness.  Take warm sitz baths to soothe any pain or discomfort caused by hemorrhoids. Use hemorrhoid cream if your health care provider approves.  Rest with your legs elevated if you have leg cramps or low back pain.  If you develop   varicose veins in your legs, wear support hose. Elevate your feet for 15 minutes, 3-4 times a day. Limit salt in your diet. Prenatal care  Schedule your prenatal visits by the twelfth week of pregnancy. They are usually scheduled monthly at first, then more often in the last 2 months before delivery.  Write down your questions. Take them to your prenatal visits.  Keep all your prenatal visits as told by your health care provider. This is important. Safety  Wear your seat belt at all times when driving.  Make a list of emergency phone numbers, including numbers for family, friends, the hospital, and police and fire departments. General instructions  Ask your health care provider for a referral to a local prenatal education class. Begin classes no later than the beginning of month 6 of your pregnancy.  Ask for help if you have counseling or nutritional needs during pregnancy. Your health care provider can offer advice or refer you to specialists for help  with various needs.  Do not use hot tubs, steam rooms, or saunas.  Do not douche or use tampons or scented sanitary pads.  Do not cross your legs for long periods of time.  Avoid cat litter boxes and soil used by cats. These carry germs that can cause birth defects in the baby and possibly loss of the fetus by miscarriage or stillbirth.  Avoid all smoking, herbs, alcohol, and medicines not prescribed by your health care provider. Chemicals in these products affect the formation and growth of the baby.  Do not use any products that contain nicotine or tobacco, such as cigarettes and e-cigarettes. If you need help quitting, ask your health care provider. You may receive counseling support and other resources to help you quit.  Schedule a dentist appointment. At home, brush your teeth with a soft toothbrush and be gentle when you floss. Contact a health care provider if:  You have dizziness.  You have mild pelvic cramps, pelvic pressure, or nagging pain in the abdominal area.  You have persistent nausea, vomiting, or diarrhea.  You have a bad smelling vaginal discharge.  You have pain when you urinate.  You notice increased swelling in your face, hands, legs, or ankles.  You are exposed to fifth disease or chickenpox.  You are exposed to German measles (rubella) and have never had it. Get help right away if:  You have a fever.  You are leaking fluid from your vagina.  You have spotting or bleeding from your vagina.  You have severe abdominal cramping or pain.  You have rapid weight gain or loss.  You vomit blood or material that looks like coffee grounds.  You develop a severe headache.  You have shortness of breath.  You have any kind of trauma, such as from a fall or a car accident. Summary  The first trimester of pregnancy is from week 1 until the end of week 13 (months 1 through 3).  Your body goes through many changes during pregnancy. The changes vary from  woman to woman.  You will have routine prenatal visits. During those visits, your health care provider will examine you, discuss any test results you may have, and talk with you about how you are feeling. This information is not intended to replace advice given to you by your health care provider. Make sure you discuss any questions you have with your health care provider. Document Released: 10/10/2001 Document Revised: 09/27/2016 Document Reviewed: 09/27/2016 Elsevier Interactive Patient Education  2017 [employer]   Inc[employer]  

## 2017-08-30 LAB — CMP AND LIVER
ALBUMIN: 4.1 g/dL (ref 3.5–5.5)
ALK PHOS: 58 IU/L (ref 39–117)
ALT: 20 IU/L (ref 0–32)
AST: 19 IU/L (ref 0–40)
BILIRUBIN TOTAL: 0.9 mg/dL (ref 0.0–1.2)
BILIRUBIN, DIRECT: 0.23 mg/dL (ref 0.00–0.40)
BUN: 5 mg/dL — ABNORMAL LOW (ref 6–20)
CHLORIDE: 104 mmol/L (ref 96–106)
CO2: 18 mmol/L — AB (ref 20–29)
Calcium: 9 mg/dL (ref 8.7–10.2)
Creatinine, Ser: 0.67 mg/dL (ref 0.57–1.00)
GFR calc Af Amer: 141 mL/min/{1.73_m2} (ref >59)
GFR calc non Af Amer: 123 mL/min/{1.73_m2} (ref >59)
Glucose: 85 mg/dL (ref 65–99)
POTASSIUM: 4.1 mmol/L (ref 3.5–5.2)
SODIUM: 137 mmol/L (ref 134–144)
TOTAL PROTEIN: 6.6 g/dL (ref 6.0–8.5)

## 2017-08-30 LAB — HEMOGLOBIN A1C
ESTIMATED AVERAGE GLUCOSE: 88 mg/dL
Hgb A1c MFr Bld: 4.7 % — ABNORMAL LOW (ref 4.8–5.6)

## 2017-09-26 ENCOUNTER — Ambulatory Visit (INDEPENDENT_AMBULATORY_CARE_PROVIDER_SITE_OTHER): Payer: Medicaid Other | Admitting: Family Medicine

## 2017-09-26 ENCOUNTER — Encounter: Payer: Self-pay | Admitting: Family Medicine

## 2017-09-26 VITALS — BP 99/64 | HR 65 | Wt 185.6 lb

## 2017-09-26 DIAGNOSIS — Z3480 Encounter for supervision of other normal pregnancy, unspecified trimester: Secondary | ICD-10-CM

## 2017-09-26 DIAGNOSIS — Z283 Underimmunization status: Secondary | ICD-10-CM

## 2017-09-26 DIAGNOSIS — Z6791 Unspecified blood type, Rh negative: Secondary | ICD-10-CM

## 2017-09-26 DIAGNOSIS — O09299 Supervision of pregnancy with other poor reproductive or obstetric history, unspecified trimester: Secondary | ICD-10-CM | POA: Insufficient documentation

## 2017-09-26 DIAGNOSIS — Z3482 Encounter for supervision of other normal pregnancy, second trimester: Secondary | ICD-10-CM

## 2017-09-26 DIAGNOSIS — O26899 Other specified pregnancy related conditions, unspecified trimester: Secondary | ICD-10-CM

## 2017-09-26 DIAGNOSIS — O09899 Supervision of other high risk pregnancies, unspecified trimester: Secondary | ICD-10-CM

## 2017-09-26 DIAGNOSIS — O9989 Other specified diseases and conditions complicating pregnancy, childbirth and the puerperium: Secondary | ICD-10-CM

## 2017-09-26 DIAGNOSIS — O09292 Supervision of pregnancy with other poor reproductive or obstetric history, second trimester: Secondary | ICD-10-CM

## 2017-09-26 DIAGNOSIS — Z2839 Other underimmunization status: Secondary | ICD-10-CM

## 2017-09-26 NOTE — Progress Notes (Signed)
   PRENATAL VISIT NOTE  Subjective:  Hannah Ball is a 26 y.o. G3P2002 at 29w5dbeing seen today for ongoing prenatal care.  She is currently monitored for the following issues for this low-risk pregnancy and has Rh negative, antepartum; Supervision of other normal pregnancy, antepartum; Rubella non-immune status, antepartum; Short interval between pregnancies affecting pregnancy, antepartum; and History of macrosomia in infant in prior pregnancy, currently pregnant on their problem list.  Patient reports no complaints.   .  .  Movement: Absent. Denies leaking of fluid.   The following portions of the patient's history were reviewed and updated as appropriate: allergies, current medications, past family history, past medical history, past social history, past surgical history and problem list. Problem list updated.  Objective:   Vitals:   09/26/17 1313  BP: 99/64  Pulse: 65  Weight: 185 lb 9.6 oz (84.2 kg)    Fetal Status: Fetal Heart Rate (bpm): 164   Movement: Absent     General:  Alert, oriented and cooperative. Patient is in no acute distress.  Skin: Skin is warm and dry. No rash noted.   Cardiovascular: Normal heart rate noted  Respiratory: Normal respiratory effort, no problems with respiration noted  Abdomen: Soft, gravid, appropriate for gestational age.  Pain/Pressure: Absent     Pelvic: Cervical exam deferred        Extremities: Normal range of motion.     Mental Status:  Normal mood and affect. Normal behavior. Normal judgment and thought content.   Assessment and Plan:  Pregnancy: G3P2002 at 176w5d1. Rh negative, antepartum Rhogam at 28wks  2. Supervision of other normal pregnancy, antepartum UTD currently   3. Rubella non-immune status, antepartum MMR pp  4. Short interval between pregnancies affecting pregnancy, antepartum Discuss contraceptive plans  5. History of macrosomia in infant in prior pregnancy, currently pregnant Monitor FH closely,  counseled on diet and exercise during pregnancy  Preterm labor symptoms and general obstetric precautions including but not limited to vaginal bleeding, contractions, leaking of fluid and fetal movement were reviewed in detail with the patient. Please refer to After Visit Summary for other counseling recommendations.  Return in about 4 weeks (around 10/24/2017) for Routine prenatal care.   KiCaren MacadamMD

## 2017-09-27 ENCOUNTER — Encounter (HOSPITAL_COMMUNITY): Payer: Self-pay | Admitting: Obstetrics & Gynecology

## 2017-10-03 ENCOUNTER — Encounter (HOSPITAL_COMMUNITY): Payer: Self-pay

## 2017-10-03 ENCOUNTER — Ambulatory Visit (HOSPITAL_COMMUNITY)
Admission: RE | Admit: 2017-10-03 | Discharge: 2017-10-03 | Disposition: A | Discharge status: Home / Self Care | Payer: Medicaid Other | Source: Ambulatory Visit | Attending: Obstetrics & Gynecology | Admitting: Obstetrics & Gynecology

## 2017-10-03 ENCOUNTER — Other Ambulatory Visit: Payer: Self-pay | Admitting: Obstetrics & Gynecology

## 2017-10-03 DIAGNOSIS — Z3480 Encounter for supervision of other normal pregnancy, unspecified trimester: Secondary | ICD-10-CM

## 2017-10-03 DIAGNOSIS — Z3A11 11 weeks gestation of pregnancy: Secondary | ICD-10-CM

## 2017-10-03 DIAGNOSIS — O09891 Supervision of other high risk pregnancies, first trimester: Secondary | ICD-10-CM

## 2017-10-03 DIAGNOSIS — Z3682 Encounter for antenatal screening for nuchal translucency: Secondary | ICD-10-CM | POA: Diagnosis not present

## 2017-10-10 ENCOUNTER — Telehealth: Payer: Self-pay | Admitting: *Deleted

## 2017-10-10 DIAGNOSIS — Z3480 Encounter for supervision of other normal pregnancy, unspecified trimester: Secondary | ICD-10-CM

## 2017-10-10 MED ORDER — DOCUSATE SODIUM 100 MG PO CAPS: 100.0000 mg | ORAL_CAPSULE | Freq: Two times a day (BID) | ORAL | 2 refills | Status: DC | PRN

## 2017-10-10 NOTE — Telephone Encounter (Signed)
Pt requested medication for constipation. Sent colace to preferred pharmacy

## 2017-10-11 ENCOUNTER — Other Ambulatory Visit (HOSPITAL_COMMUNITY): Payer: Self-pay

## 2017-10-22 ENCOUNTER — Other Ambulatory Visit (HOSPITAL_COMMUNITY): Payer: Self-pay

## 2017-10-24 ENCOUNTER — Encounter: Payer: Self-pay | Admitting: Obstetrics and Gynecology

## 2017-10-24 ENCOUNTER — Ambulatory Visit (INDEPENDENT_AMBULATORY_CARE_PROVIDER_SITE_OTHER): Payer: Medicaid Other | Admitting: Obstetrics and Gynecology

## 2017-10-24 VITALS — BP 91/52 | HR 82 | Wt 187.2 lb

## 2017-10-24 DIAGNOSIS — Z3482 Encounter for supervision of other normal pregnancy, second trimester: Secondary | ICD-10-CM

## 2017-10-24 DIAGNOSIS — Z3480 Encounter for supervision of other normal pregnancy, unspecified trimester: Secondary | ICD-10-CM

## 2017-10-24 MED ORDER — RANITIDINE HCL 150 MG PO TABS: 150.0000 mg | ORAL_TABLET | Freq: Two times a day (BID) | ORAL | 4 refills | Status: DC

## 2017-10-24 NOTE — Progress Notes (Signed)
Prenatal Visit Note Date: 10/24/2017 Clinic: [facility]  Subjective:  Hannah Ball is a 26 y.o. G3P2002 at 6732w5d being seen today for ongoing prenatal care.  She is currently monitored for the following issues for this low-risk pregnancy and has Rh negative, antepartum; Supervision of other normal pregnancy, antepartum; Rubella non-immune status, antepartum; Short interval between pregnancies affecting pregnancy, antepartum; and History of macrosomia in infant in prior pregnancy, currently pregnant on their problem list.  Patient reports no complaints.    . Vag. Bleeding: None.  Movement: Present?. Denies leaking of fluid.   The following portions of the patient's history were reviewed and updated as appropriate: allergies, current medications, past family history, past medical history, past social history, past surgical history and problem list. Problem list updated.  Objective:   Vitals:   10/24/17 1330  BP: (!) 91/52  Pulse: 82  Weight: 187 lb 3.2 oz (84.9 kg)    Fetal Status: Fetal Heart Rate (bpm): 141   Movement: Present     General:  Alert, oriented and cooperative. Patient is in no acute distress.  Skin: Skin is warm and dry. No rash noted.   Cardiovascular: Normal heart rate noted  Respiratory: Normal respiratory effort, no problems with respiration noted  Abdomen: Soft, gravid, appropriate for gestational age. Pain/Pressure: Absent     Pelvic:  Cervical exam deferred        Extremities: Normal range of motion.     Mental Status: Normal mood and affect. Normal behavior. Normal judgment and thought content.   Urinalysis:      Assessment and Plan:  Pregnancy: G3P2002 at 1132w5d  1. Supervision of other normal pregnancy, antepartum Routine care. Ask about afp nv. Recommend PNV gummies if doesn't like taste of regular ones - US MFM OB COMP + 14 WK; Future  Preterm labor symptoms and general obstetric precautions including but not limited to  vaginal bleeding, contractions, leaking of fluid and fetal movement were reviewed in detail with the patient. Please refer to After Visit Summary for other counseling recommendations.  Return in about 3 weeks (around 11/14/2017) for rob.   [place] BingPickens, Sabastion Hrdlicka, MD

## 2017-10-30 NOTE — L&D Delivery Note (Signed)
Patient is 27 y.o. [id number] 5761w1d admitted for elective IOL. S/p IOL with Pitocin. AROM at delivery.  Prenatal course also complicated by Rh negative, rubella non-immune, and short interval between pregnancies.  GBS negative  Delivery Note At 6:40 AM a viable female was delivered via Vaginal, Spontaneous (Presentation: ROP).  APGAR: 8, 9; weight pending.  Placenta status: Intact.  Cord: 3V with the following complications: Nuchal x1, reduced at pertineum.  Cord pH: N/A  Anesthesia:  Epidural Episiotomy: None Lacerations: None Est. Blood Loss (mL): 150  Mom to postpartum.  Baby to Couplet care / Skin to Skin.  Hannah AdaJazma Caera Enwright, DO 04/13/2018, 7:05 AM OB Fellow [facility]

## 2017-11-14 ENCOUNTER — Ambulatory Visit (INDEPENDENT_AMBULATORY_CARE_PROVIDER_SITE_OTHER): Payer: Medicaid Other | Admitting: Obstetrics and Gynecology

## 2017-11-14 VITALS — BP 108/58 | HR 78 | Wt 193.0 lb

## 2017-11-14 DIAGNOSIS — Z6791 Unspecified blood type, Rh negative: Secondary | ICD-10-CM

## 2017-11-14 DIAGNOSIS — O09892 Supervision of other high risk pregnancies, second trimester: Secondary | ICD-10-CM

## 2017-11-14 DIAGNOSIS — O26899 Other specified pregnancy related conditions, unspecified trimester: Secondary | ICD-10-CM

## 2017-11-14 DIAGNOSIS — Z3482 Encounter for supervision of other normal pregnancy, second trimester: Secondary | ICD-10-CM

## 2017-11-14 DIAGNOSIS — O09292 Supervision of pregnancy with other poor reproductive or obstetric history, second trimester: Secondary | ICD-10-CM

## 2017-11-14 DIAGNOSIS — Z3480 Encounter for supervision of other normal pregnancy, unspecified trimester: Secondary | ICD-10-CM

## 2017-11-14 DIAGNOSIS — O09299 Supervision of pregnancy with other poor reproductive or obstetric history, unspecified trimester: Secondary | ICD-10-CM

## 2017-11-14 NOTE — Progress Notes (Signed)
Prenatal Visit Note Date: 11/14/2017 Clinic: [facility]  Subjective:  Hannah Ball is a 27 y.o. G3P2002 at 4232w5d being seen today for ongoing prenatal care.  She is currently monitored for the following issues for this high-risk pregnancy and has Rh negative, antepartum; Supervision of other normal pregnancy, antepartum; Rubella non-immune status, antepartum; Short interval between pregnancies affecting pregnancy, antepartum; and History of macrosomia in infant in prior pregnancy, currently pregnant on their problem list.  Patient reports no complaints.    . Vag. Bleeding: None.  Movement: Present. Denies leaking of fluid.   The following portions of the patient's history were reviewed and updated as appropriate: allergies, current medications, past family history, past medical history, past social history, past surgical history and problem list. Problem list updated.  Objective:   Vitals:   11/14/17 1008  BP: (!) 108/58  Pulse: 78  Weight: 193 lb (87.5 kg)    Fetal Status: Fetal Heart Rate (bpm): 151   Movement: Present     General:  Alert, oriented and cooperative. Patient is in no acute distress.  Skin: Skin is warm and dry. No rash noted.   Cardiovascular: Normal heart rate noted  Respiratory: Normal respiratory effort, no problems with respiration noted  Abdomen: Soft, gravid, appropriate for gestational age. Pain/Pressure: Absent     Pelvic:  Cervical exam deferred        Extremities: Normal range of motion.     Mental Status: Normal mood and affect. Normal behavior. Normal judgment and thought content.   Urinalysis:      Assessment and Plan:  Pregnancy: G3P2002 at 1932w5d  1. Supervision of other normal pregnancy, antepartum Routine care. Declines afp. Anatomy u/s already scheduled.  - AFP, Serum, Open Spina Bifida   Preterm labor symptoms and general obstetric precautions including but not limited to vaginal bleeding, contractions,  leaking of fluid and fetal movement were reviewed in detail with the patient. Please refer to After Visit Summary for other counseling recommendations.  Return in about 4 weeks (around 12/12/2017) for rob.   [place] BingPickens, Karisha Marlin, MD

## 2017-11-23 ENCOUNTER — Other Ambulatory Visit: Payer: Self-pay | Admitting: Obstetrics and Gynecology

## 2017-11-23 ENCOUNTER — Ambulatory Visit (HOSPITAL_COMMUNITY)
Admission: RE | Admit: 2017-11-23 | Discharge: 2017-11-23 | Disposition: A | Discharge status: Home / Self Care | Payer: Medicaid Other | Source: Ambulatory Visit | Attending: Obstetrics and Gynecology | Admitting: Obstetrics and Gynecology

## 2017-11-23 DIAGNOSIS — Z3A19 19 weeks gestation of pregnancy: Secondary | ICD-10-CM | POA: Insufficient documentation

## 2017-11-23 DIAGNOSIS — O358XX0 Maternal care for other (suspected) fetal abnormality and damage, not applicable or unspecified: Secondary | ICD-10-CM | POA: Diagnosis present

## 2017-11-23 DIAGNOSIS — Z3480 Encounter for supervision of other normal pregnancy, unspecified trimester: Secondary | ICD-10-CM

## 2017-11-23 DIAGNOSIS — O359XX0 Maternal care for (suspected) fetal abnormality and damage, unspecified, not applicable or unspecified: Secondary | ICD-10-CM

## 2017-11-23 DIAGNOSIS — Z363 Encounter for antenatal screening for malformations: Secondary | ICD-10-CM

## 2017-11-26 ENCOUNTER — Encounter: Payer: Self-pay | Admitting: Obstetrics and Gynecology

## 2017-11-26 DIAGNOSIS — O35EXX0 Maternal care for other (suspected) fetal abnormality and damage, fetal genitourinary anomalies, not applicable or unspecified: Secondary | ICD-10-CM | POA: Insufficient documentation

## 2017-11-26 DIAGNOSIS — O358XX0 Maternal care for other (suspected) fetal abnormality and damage, not applicable or unspecified: Secondary | ICD-10-CM | POA: Insufficient documentation

## 2017-12-11 ENCOUNTER — Ambulatory Visit (INDEPENDENT_AMBULATORY_CARE_PROVIDER_SITE_OTHER): Payer: Medicaid Other | Admitting: Family Medicine

## 2017-12-11 ENCOUNTER — Encounter: Payer: Self-pay | Admitting: Family Medicine

## 2017-12-11 VITALS — BP 110/69 | HR 97 | Wt 197.0 lb

## 2017-12-11 DIAGNOSIS — Z3482 Encounter for supervision of other normal pregnancy, second trimester: Secondary | ICD-10-CM

## 2017-12-11 DIAGNOSIS — Z3480 Encounter for supervision of other normal pregnancy, unspecified trimester: Secondary | ICD-10-CM

## 2017-12-11 NOTE — Progress Notes (Signed)
   PRENATAL VISIT NOTE  Subjective:  Hannah Ball is a 27 y.o. G3P2002 at 339w4d being seen today for ongoing prenatal care.  She is currently monitored for the following issues for this low-risk pregnancy and has Rh negative, antepartum; Supervision of other normal pregnancy, antepartum; Rubella non-immune status, antepartum; Short interval between pregnancies affecting pregnancy, antepartum; History of macrosomia in infant in prior pregnancy, currently pregnant; and Fetal bilateral renal pyelectasis on their problem list.  Patient reports no complaints.  Contractions: Irritability. Vag. Bleeding: None.  Movement: Present. Denies leaking of fluid.   The following portions of the patient's history were reviewed and updated as appropriate: allergies, current medications, past family history, past medical history, past social history, past surgical history and problem list. Problem list updated.  Objective:   Vitals:   12/11/17 1031  BP: 110/69  Pulse: 97  Weight: 197 lb (89.4 kg)    Fetal Status: Fetal Heart Rate (bpm): 135   Movement: Present     General:  Alert, oriented and cooperative. Patient is in no acute distress.  Skin: Skin is warm and dry. No rash noted.   Cardiovascular: Normal heart rate noted  Respiratory: Normal respiratory effort, no problems with respiration noted  Abdomen: Soft, gravid, appropriate for gestational age.  Pain/Pressure: Present     Pelvic: Cervical exam deferred        Extremities: Normal range of motion.     Mental Status:  Normal mood and affect. Normal behavior. Normal judgment and thought content.   Assessment and Plan:  Pregnancy: G3P2002 at 3939w4d  1. Supervision of other normal pregnancy, antepartum AFP today - AFP, Serum, Open Spina Bifida - US MFM OB FOLLOW UP; Future  General obstetric precautions including but not limited to vaginal bleeding, contractions, leaking of fluid and fetal movement were reviewed in detail with the  patient. Please refer to After Visit Summary for other counseling recommendations.  Return in 4 weeks (on 01/08/2018) for 28 wk labs.   Reva Boresanya S Naija Troost, MD

## 2017-12-11 NOTE — Patient Instructions (Signed)
 Second Trimester of Pregnancy The second trimester is from week 14 through week 27 (months 4 through 6). The second trimester is often a time when you feel your best. Your body has adjusted to being pregnant, and you begin to feel better physically. Usually, morning sickness has lessened or quit completely, you may have more energy, and you may have an increase in appetite. The second trimester is also a time when the fetus is growing rapidly. At the end of the sixth month, the fetus is about 9 inches long and weighs about 1 pounds. You will likely begin to feel the baby move (quickening) between 16 and 20 weeks of pregnancy. Body changes during your second trimester Your body continues to go through many changes during your second trimester. The changes vary from woman to woman.  Your weight will continue to increase. You will notice your lower abdomen bulging out.  You may begin to get stretch marks on your hips, abdomen, and breasts.  You may develop headaches that can be relieved by medicines. The medicines should be approved by your health care provider.  You may urinate more often because the fetus is pressing on your bladder.  You may develop or continue to have heartburn as a result of your pregnancy.  You may develop constipation because certain hormones are causing the muscles that push waste through your intestines to slow down.  You may develop hemorrhoids or swollen, bulging veins (varicose veins).  You may have back pain. This is caused by: ? Weight gain. ? Pregnancy hormones that are relaxing the joints in your pelvis. ? A shift in weight and the muscles that support your balance.  Your breasts will continue to grow and they will continue to become tender.  Your gums may bleed and may be sensitive to brushing and flossing.  Dark spots or blotches (chloasma, mask of pregnancy) may develop on your face. This will likely fade after the baby is born.  A dark line from  your belly button to the pubic area (linea nigra) may appear. This will likely fade after the baby is born.  You may have changes in your hair. These can include thickening of your hair, rapid growth, and changes in texture. Some women also have hair loss during or after pregnancy, or hair that feels dry or thin. Your hair will most likely return to normal after your baby is born.  What to expect at prenatal visits During a routine prenatal visit:  You will be weighed to make sure you and the fetus are growing normally.  Your blood pressure will be taken.  Your abdomen will be measured to track your baby's growth.  The fetal heartbeat will be listened to.  Any test results from the previous visit will be discussed.  Your health care provider may ask you:  How you are feeling.  If you are feeling the baby move.  If you have had any abnormal symptoms, such as leaking fluid, bleeding, severe headaches, or abdominal cramping.  If you are using any tobacco products, including cigarettes, chewing tobacco, and electronic cigarettes.  If you have any questions.  Other tests that may be performed during your second trimester include:  Blood tests that check for: ? Low iron levels (anemia). ? High blood sugar that affects pregnant women (gestational diabetes) between 24 and 28 weeks. ? Rh antibodies. This is to check for a protein on red blood cells (Rh factor).  Urine tests to check for infections, diabetes,   or protein in the urine.  An ultrasound to confirm the proper growth and development of the baby.  An amniocentesis to check for possible genetic problems.  Fetal screens for spina bifida and Down syndrome.  HIV (human immunodeficiency virus) testing. Routine prenatal testing includes screening for HIV, unless you choose not to have this test.  Follow these instructions at home: Medicines  Follow your health care provider's instructions regarding medicine use. Specific  medicines may be either safe or unsafe to take during pregnancy.  Take a prenatal vitamin that contains at least 600 micrograms (mcg) of folic acid.  If you develop constipation, try taking a stool softener if your health care provider approves. Eating and drinking  Eat a balanced diet that includes fresh fruits and vegetables, whole grains, good sources of protein such as meat, eggs, or tofu, and low-fat dairy. Your health care provider will help you determine the amount of weight gain that is right for you.  Avoid raw meat and uncooked cheese. These carry germs that can cause birth defects in the baby.  If you have low calcium intake from food, talk to your health care provider about whether you should take a daily calcium supplement.  Limit foods that are high in fat and processed sugars, such as fried and sweet foods.  To prevent constipation: ? Drink enough fluid to keep your urine clear or pale yellow. ? Eat foods that are high in fiber, such as fresh fruits and vegetables, whole grains, and beans. Activity  Exercise only as directed by your health care provider. Most women can continue their usual exercise routine during pregnancy. Try to exercise for 30 minutes at least 5 days a week. Stop exercising if you experience uterine contractions.  Avoid heavy lifting, wear low heel shoes, and practice good posture.  A sexual relationship may be continued unless your health care provider directs you otherwise. Relieving pain and discomfort  Wear a good support bra to prevent discomfort from breast tenderness.  Take warm sitz baths to soothe any pain or discomfort caused by hemorrhoids. Use hemorrhoid cream if your health care provider approves.  Rest with your legs elevated if you have leg cramps or low back pain.  If you develop varicose veins, wear support hose. Elevate your feet for 15 minutes, 3-4 times a day. Limit salt in your diet. Prenatal Care  Write down your questions.  Take them to your prenatal visits.  Keep all your prenatal visits as told by your health care provider. This is important. Safety  Wear your seat belt at all times when driving.  Make a list of emergency phone numbers, including numbers for family, friends, the hospital, and police and fire departments. General instructions  Ask your health care provider for a referral to a local prenatal education class. Begin classes no later than the beginning of month 6 of your pregnancy.  Ask for help if you have counseling or nutritional needs during pregnancy. Your health care provider can offer advice or refer you to specialists for help with various needs.  Do not use hot tubs, steam rooms, or saunas.  Do not douche or use tampons or scented sanitary pads.  Do not cross your legs for long periods of time.  Avoid cat litter boxes and soil used by cats. These carry germs that can cause birth defects in the baby and possibly loss of the fetus by miscarriage or stillbirth.  Avoid all smoking, herbs, alcohol, and unprescribed drugs. Chemicals in these products   can affect the formation and growth of the baby.  Do not use any products that contain nicotine or tobacco, such as cigarettes and e-cigarettes. If you need help quitting, ask your health care provider.  Visit your dentist if you have not gone yet during your pregnancy. Use a soft toothbrush to brush your teeth and be gentle when you floss. Contact a health care provider if:  You have dizziness.  You have mild pelvic cramps, pelvic pressure, or nagging pain in the abdominal area.  You have persistent nausea, vomiting, or diarrhea.  You have a bad smelling vaginal discharge.  You have pain when you urinate. Get help right away if:  You have a fever.  You are leaking fluid from your vagina.  You have spotting or bleeding from your vagina.  You have severe abdominal cramping or pain.  You have rapid weight gain or weight  loss.  You have shortness of breath with chest pain.  You notice sudden or extreme swelling of your face, hands, ankles, feet, or legs.  You have not felt your baby move in over an hour.  You have severe headaches that do not go away when you take medicine.  You have vision changes. Summary  The second trimester is from week 14 through week 27 (months 4 through 6). It is also a time when the fetus is growing rapidly.  Your body goes through many changes during pregnancy. The changes vary from woman to woman.  Avoid all smoking, herbs, alcohol, and unprescribed drugs. These chemicals affect the formation and growth your baby.  Do not use any tobacco products, such as cigarettes, chewing tobacco, and e-cigarettes. If you need help quitting, ask your health care provider.  Contact your health care provider if you have any questions. Keep all prenatal visits as told by your health care provider. This is important. This information is not intended to replace advice given to you by your health care provider. Make sure you discuss any questions you have with your health care provider. Document Released: 10/10/2001 Document Revised: 11/21/2016 Document Reviewed: 11/21/2016 [employer] Interactive Patient Education  2018 [employer]   Breastfeeding Choosing to breastfeed is one of the best decisions you can make for yourself and your baby. A change in hormones during pregnancy causes your breasts to make breast milk in your milk-producing glands. Hormones prevent breast milk from being released before your baby is born. They also prompt milk flow after birth. Once breastfeeding has begun, thoughts of your baby, as well as his or her sucking or crying, can stimulate the release of milk from your milk-producing glands. Benefits of breastfeeding Research shows that breastfeeding offers many health benefits for infants and mothers. It also offers a cost-free and convenient way to feed your  baby. For your baby  Your first milk (colostrum) helps your baby's digestive system to function better.  Special cells in your milk (antibodies) help your baby to fight off infections.  Breastfed babies are less likely to develop asthma, allergies, obesity, or type 2 diabetes. They are also at lower risk for sudden infant death syndrome (SIDS).  Nutrients in breast milk are better able to meet your baby's needs compared to infant formula.  Breast milk improves your baby's brain development. For you  Breastfeeding helps to create a very special bond between you and your baby.  Breastfeeding is convenient. Breast milk costs nothing and is always available at the correct temperature.  Breastfeeding helps to burn calories. It helps you   to lose the weight that you gained during pregnancy.  Breastfeeding makes your uterus return faster to its size before pregnancy. It also slows bleeding (lochia) after you give birth.  Breastfeeding helps to lower your risk of developing type 2 diabetes, osteoporosis, rheumatoid arthritis, cardiovascular disease, and breast, ovarian, uterine, and endometrial cancer later in life. Breastfeeding basics Starting breastfeeding  Find a comfortable place to sit or lie down, with your neck and back well-supported.  Place a pillow or a rolled-up blanket under your baby to bring him or her to the level of your breast (if you are seated). Nursing pillows are specially designed to help support your arms and your baby while you breastfeed.  Make sure that your baby's tummy (abdomen) is facing your abdomen.  Gently massage your breast. With your fingertips, massage from the outer edges of your breast inward toward the nipple. This encourages milk flow. If your milk flows slowly, you may need to continue this action during the feeding.  Support your breast with 4 fingers underneath and your thumb above your nipple (make the letter "C" with your hand). Make sure your  fingers are well away from your nipple and your baby's mouth.  Stroke your baby's lips gently with your finger or nipple.  When your baby's mouth is open wide enough, quickly bring your baby to your breast, placing your entire nipple and as much of the areola as possible into your baby's mouth. The areola is the colored area around your nipple. ? More areola should be visible above your baby's upper lip than below the lower lip. ? Your baby's lips should be opened and extended outward (flanged) to ensure an adequate, comfortable latch. ? Your baby's tongue should be between his or her lower gum and your breast.  Make sure that your baby's mouth is correctly positioned around your nipple (latched). Your baby's lips should create a seal on your breast and be turned out (everted).  It is common for your baby to suck about 2-3 minutes in order to start the flow of breast milk. Latching Teaching your baby how to latch onto your breast properly is very important. An improper latch can cause nipple pain, decreased milk supply, and poor weight gain in your baby. Also, if your baby is not latched onto your nipple properly, he or she may swallow some air during feeding. This can make your baby fussy. Burping your baby when you switch breasts during the feeding can help to get rid of the air. However, teaching your baby to latch on properly is still the best way to prevent fussiness from swallowing air while breastfeeding. Signs that your baby has successfully latched onto your nipple  Silent tugging or silent sucking, without causing you pain. Infant's lips should be extended outward (flanged).  Swallowing heard between every 3-4 sucks once your milk has started to flow (after your let-down milk reflex occurs).  Muscle movement above and in front of his or her ears while sucking.  Signs that your baby has not successfully latched onto your nipple  Sucking sounds or smacking sounds from your baby while  breastfeeding.  Nipple pain.  If you think your baby has not latched on correctly, slip your finger into the corner of your baby's mouth to break the suction and place it between your baby's gums. Attempt to start breastfeeding again. Signs of successful breastfeeding Signs from your baby  Your baby will gradually decrease the number of sucks or will completely stop sucking.    Your baby will fall asleep.  Your baby's body will relax.  Your baby will retain a small amount of milk in his or her mouth.  Your baby will let go of your breast by himself or herself.  Signs from you  Breasts that have increased in firmness, weight, and size 1-3 hours after feeding.  Breasts that are softer immediately after breastfeeding.  Increased milk volume, as well as a change in milk consistency and color by the fifth day of breastfeeding.  Nipples that are not sore, cracked, or bleeding.  Signs that your baby is getting enough milk  Wetting at least 1-2 diapers during the first 24 hours after birth.  Wetting at least 5-6 diapers every 24 hours for the first week after birth. The urine should be clear or pale yellow by the age of 5 days.  Wetting 6-8 diapers every 24 hours as your baby continues to grow and develop.  At least 3 stools in a 24-hour period by the age of 5 days. The stool should be soft and yellow.  At least 3 stools in a 24-hour period by the age of 7 days. The stool should be seedy and yellow.  No loss of weight greater than 10% of birth weight during the first 3 days of life.  Average weight gain of 4-7 oz (113-198 g) per week after the age of 4 days.  Consistent daily weight gain by the age of 5 days, without weight loss after the age of 2 weeks. After a feeding, your baby may spit up a small amount of milk. This is normal. Breastfeeding frequency and duration Frequent feeding will help you make more milk and can prevent sore nipples and extremely full breasts (breast  engorgement). Breastfeed when you feel the need to reduce the fullness of your breasts or when your baby shows signs of hunger. This is called "breastfeeding on demand." Signs that your baby is hungry include:  Increased alertness, activity, or restlessness.  Movement of the head from side to side.  Opening of the mouth when the corner of the mouth or cheek is stroked (rooting).  Increased sucking sounds, smacking lips, cooing, sighing, or squeaking.  Hand-to-mouth movements and sucking on fingers or hands.  Fussing or crying.  Avoid introducing a pacifier to your baby in the first 4-6 weeks after your baby is born. After this time, you may choose to use a pacifier. Research has shown that pacifier use during the first year of a baby's life decreases the risk of sudden infant death syndrome (SIDS). Allow your baby to feed on each breast as long as he or she wants. When your baby unlatches or falls asleep while feeding from the first breast, offer the second breast. Because newborns are often sleepy in the first few weeks of life, you may need to awaken your baby to get him or her to feed. Breastfeeding times will vary from baby to baby. However, the following rules can serve as a guide to help you make sure that your baby is properly fed:  Newborns (babies 4 weeks of age or younger) may breastfeed every 1-3 hours.  Newborns should not go without breastfeeding for longer than 3 hours during the day or 5 hours during the night.  You should breastfeed your baby a minimum of 8 times in a 24-hour period.  Breast milk pumping Pumping and storing breast milk allows you to make sure that your baby is exclusively fed your breast milk, even at times   when you are unable to breastfeed. This is especially important if you go back to work while you are still breastfeeding, or if you are not able to be present during feedings. Your lactation consultant can help you find a method of pumping that works best  for you and give you guidelines about how long it is safe to store breast milk. Caring for your breasts while you breastfeed Nipples can become dry, cracked, and sore while breastfeeding. The following recommendations can help keep your breasts moisturized and healthy:  Avoid using soap on your nipples.  Wear a supportive bra designed especially for nursing. Avoid wearing underwire-style bras or extremely tight bras (sports bras).  Air-dry your nipples for 3-4 minutes after each feeding.  Use only cotton bra pads to absorb leaked breast milk. Leaking of breast milk between feedings is normal.  Use lanolin on your nipples after breastfeeding. Lanolin helps to maintain your skin's normal moisture barrier. Pure lanolin is not harmful (not toxic) to your baby. You may also hand express a few drops of breast milk and gently massage that milk into your nipples and allow the milk to air-dry.  In the first few weeks after giving birth, some women experience breast engorgement. Engorgement can make your breasts feel heavy, warm, and tender to the touch. Engorgement peaks within 3-5 days after you give birth. The following recommendations can help to ease engorgement:  Completely empty your breasts while breastfeeding or pumping. You may want to start by applying warm, moist heat (in the shower or with warm, water-soaked hand towels) just before feeding or pumping. This increases circulation and helps the milk flow. If your baby does not completely empty your breasts while breastfeeding, pump any extra milk after he or she is finished.  Apply ice packs to your breasts immediately after breastfeeding or pumping, unless this is too uncomfortable for you. To do this: ? Put ice in a plastic bag. ? Place a towel between your skin and the bag. ? Leave the ice on for 20 minutes, 2-3 times a day.  Make sure that your baby is latched on and positioned properly while breastfeeding.  If engorgement persists  after 48 hours of following these recommendations, contact your health care provider or a lactation consultant. Overall health care recommendations while breastfeeding  Eat 3 healthy meals and 3 snacks every day. Well-nourished mothers who are breastfeeding need an additional 450-500 calories a day. You can meet this requirement by increasing the amount of a balanced diet that you eat.  Drink enough water to keep your urine pale yellow or clear.  Rest often, relax, and continue to take your prenatal vitamins to prevent fatigue, stress, and low vitamin and mineral levels in your body (nutrient deficiencies).  Do not use any products that contain nicotine or tobacco, such as cigarettes and e-cigarettes. Your baby may be harmed by chemicals from cigarettes that pass into breast milk and exposure to secondhand smoke. If you need help quitting, ask your health care provider.  Avoid alcohol.  Do not use illegal drugs or marijuana.  Talk with your health care provider before taking any medicines. These include over-the-counter and prescription medicines as well as vitamins and herbal supplements. Some medicines that may be harmful to your baby can pass through breast milk.  It is possible to become pregnant while breastfeeding. If birth control is desired, ask your health care provider about options that will be safe while breastfeeding your baby. Where to find more information: [employer]: www.llli.org Contact a health care provider if:  You feel like you want to stop breastfeeding or have become frustrated with breastfeeding.  Your nipples are cracked or bleeding.  Your breasts are red, tender, or warm.  You have: ? Painful breasts or nipples. ? A swollen area on either breast. ? A fever or chills. ? Nausea or vomiting. ? Drainage other than breast milk from your nipples.  Your breasts do not become full before feedings by the fifth day after you give birth.  You  feel sad and depressed.  Your baby is: ? Too sleepy to eat well. ? Having trouble sleeping. ? More than 1 week old and wetting fewer than 6 diapers in a 24-hour period. ? Not gaining weight by 5 days of age.  Your baby has fewer than 3 stools in a 24-hour period.  Your baby's skin or the white parts of his or her eyes become yellow. Get help right away if:  Your baby is overly tired (lethargic) and does not want to wake up and feed.  Your baby develops an unexplained fever. Summary  Breastfeeding offers many health benefits for infant and mothers.  Try to breastfeed your infant when he or she shows early signs of hunger.  Gently tickle or stroke your baby's lips with your finger or nipple to allow the baby to open his or her mouth. Bring the baby to your breast. Make sure that much of the areola is in your baby's mouth. Offer one side and burp the baby before you offer the other side.  Talk with your health care provider or lactation consultant if you have questions or you face problems as you breastfeed. This information is not intended to replace advice given to you by your health care provider. Make sure you discuss any questions you have with your health care provider. Document Released: 10/16/2005 Document Revised: 11/17/2016 Document Reviewed: 11/17/2016 [employer] Interactive Patient Education  2018 [employer]  

## 2017-12-14 LAB — AFP, SERUM, OPEN SPINA BIFIDA
AFP MOM: 0.82
AFP VALUE AFPOSL: 46.8 ng/mL
Gest. Age on Collection Date: 21.6 weeks
Maternal Age At EDD: 26.5 yr
OSBR RISK 1 IN: 10000
TEST RESULTS AFP: NEGATIVE
WEIGHT: 193 [lb_av]

## 2018-01-08 ENCOUNTER — Ambulatory Visit (INDEPENDENT_AMBULATORY_CARE_PROVIDER_SITE_OTHER): Payer: Medicaid Other | Admitting: Obstetrics and Gynecology

## 2018-01-08 VITALS — BP 115/72 | HR 78 | Wt 203.0 lb

## 2018-01-08 DIAGNOSIS — O358XX0 Maternal care for other (suspected) fetal abnormality and damage, not applicable or unspecified: Secondary | ICD-10-CM

## 2018-01-08 DIAGNOSIS — O35EXX0 Maternal care for other (suspected) fetal abnormality and damage, fetal genitourinary anomalies, not applicable or unspecified: Secondary | ICD-10-CM

## 2018-01-08 DIAGNOSIS — Z6791 Unspecified blood type, Rh negative: Secondary | ICD-10-CM

## 2018-01-08 DIAGNOSIS — O09899 Supervision of other high risk pregnancies, unspecified trimester: Secondary | ICD-10-CM

## 2018-01-08 DIAGNOSIS — Z3480 Encounter for supervision of other normal pregnancy, unspecified trimester: Secondary | ICD-10-CM

## 2018-01-08 DIAGNOSIS — O26899 Other specified pregnancy related conditions, unspecified trimester: Secondary | ICD-10-CM

## 2018-01-08 MED ORDER — RHO D IMMUNE GLOBULIN 1500 UNIT/2ML IJ SOSY: 300.0000 ug | PREFILLED_SYRINGE | Freq: Once | INTRAMUSCULAR | Status: DC

## 2018-01-08 NOTE — Progress Notes (Signed)
Prenatal Visit Note Date: 01/08/2018 Clinic: [facility]  Subjective:  Hannah Ball is a 27 y.o. G3P2002 at 7087w4d being seen today for ongoing prenatal care.  She is currently monitored for the following issues for this low-risk pregnancy and has Rh negative, antepartum; Supervision of other normal pregnancy, antepartum; Rubella non-immune status, antepartum; Short interval between pregnancies affecting pregnancy, antepartum; History of macrosomia in infant in prior pregnancy, currently pregnant; and Fetal bilateral renal pyelectasis on their problem list.  Patient reports no complaints.   Contractions: Not present. Vag. Bleeding: None.  Movement: Present. Denies leaking of fluid.   The following portions of the patient's history were reviewed and updated as appropriate: allergies, current medications, past family history, past medical history, past social history, past surgical history and problem list. Problem list updated.  Objective:   Vitals:   01/08/18 0855  BP: 115/72  Pulse: 78  Weight: 203 lb (92.1 kg)    Fetal Status: Fetal Heart Rate (bpm): 154   Movement: Present     General:  Alert, oriented and cooperative. Patient is in no acute distress.  Skin: Skin is warm and dry. No rash noted.   Cardiovascular: Normal heart rate noted  Respiratory: Normal respiratory effort, no problems with respiration noted  Abdomen: Soft, gravid, appropriate for gestational age. Pain/Pressure: Present     Pelvic:  Cervical exam deferred        Extremities: Normal range of motion.     Mental Status: Normal mood and affect. Normal behavior. Normal judgment and thought content.   Urinalysis:      Assessment and Plan:  Pregnancy: G3P2002 at 3687w4d  1. Supervision of other normal pregnancy, antepartum Routine care - Glucose Tolerance, 2 Hours w/1 Hour - CBC - RPR - HIV antibody  2. Fetal bilateral renal pyelectasis F/u third trimester u/s  3. Rh negative,  antepartum Rhogam, ab screen nv  Preterm labor symptoms and general obstetric precautions including but not limited to vaginal bleeding, contractions, leaking of fluid and fetal movement were reviewed in detail with the patient. Please refer to After Visit Summary for other counseling recommendations.  Return in about 3 weeks (around 01/29/2018) for rob.   [place] BingPickens, Shahmeer Bunn, MD

## 2018-01-09 LAB — CBC
HEMATOCRIT: 34.4 % (ref 34.0–46.6)
Hemoglobin: 11 g/dL — ABNORMAL LOW (ref 11.1–15.9)
MCH: 25.1 pg — AB (ref 26.6–33.0)
MCHC: 32 g/dL (ref 31.5–35.7)
MCV: 78 fL — AB (ref 79–97)
PLATELETS: 176 10*3/uL (ref 150–379)
RBC: 4.39 x10E6/uL (ref 3.77–5.28)
RDW: 14.7 % (ref 12.3–15.4)
WBC: 6.7 10*3/uL (ref 3.4–10.8)

## 2018-01-09 LAB — RPR: RPR Ser Ql: NONREACTIVE

## 2018-01-09 LAB — HIV ANTIBODY (ROUTINE TESTING W REFLEX): HIV Screen 4th Generation wRfx: NONREACTIVE

## 2018-01-09 LAB — GLUCOSE TOLERANCE, 2 HOURS W/ 1HR
GLUCOSE, FASTING: 80 mg/dL (ref 65–91)
Glucose, 1 hour: 72 mg/dL (ref 65–179)
Glucose, 2 hour: 66 mg/dL (ref 65–152)

## 2018-01-10 ENCOUNTER — Encounter: Payer: Self-pay | Admitting: Emergency Medicine

## 2018-01-10 ENCOUNTER — Emergency Department
Admission: EM | Admit: 2018-01-10 | Discharge: 2018-01-10 | Disposition: A | Discharge status: Home / Self Care | Payer: Medicaid Other | Attending: Emergency Medicine | Admitting: Emergency Medicine

## 2018-01-10 DIAGNOSIS — R509 Fever, unspecified: Secondary | ICD-10-CM | POA: Diagnosis not present

## 2018-01-10 DIAGNOSIS — R55 Syncope and collapse: Secondary | ICD-10-CM | POA: Insufficient documentation

## 2018-01-10 DIAGNOSIS — N390 Urinary tract infection, site not specified: Secondary | ICD-10-CM

## 2018-01-10 DIAGNOSIS — Z79899 Other long term (current) drug therapy: Secondary | ICD-10-CM | POA: Diagnosis not present

## 2018-01-10 DIAGNOSIS — M791 Myalgia, unspecified site: Secondary | ICD-10-CM | POA: Diagnosis not present

## 2018-01-10 DIAGNOSIS — Z3A26 26 weeks gestation of pregnancy: Secondary | ICD-10-CM | POA: Diagnosis not present

## 2018-01-10 DIAGNOSIS — O9989 Other specified diseases and conditions complicating pregnancy, childbirth and the puerperium: Secondary | ICD-10-CM | POA: Diagnosis present

## 2018-01-10 DIAGNOSIS — O2342 Unspecified infection of urinary tract in pregnancy, second trimester: Secondary | ICD-10-CM | POA: Diagnosis not present

## 2018-01-10 LAB — URINALYSIS, COMPLETE (UACMP) WITH MICROSCOPIC
BILIRUBIN URINE: NEGATIVE
Glucose, UA: NEGATIVE mg/dL
Hgb urine dipstick: NEGATIVE
Ketones, ur: NEGATIVE mg/dL
Nitrite: NEGATIVE
Protein, ur: 30 mg/dL — AB
SPECIFIC GRAVITY, URINE: 1.021 (ref 1.005–1.030)
pH: 5 (ref 5.0–8.0)

## 2018-01-10 LAB — CBC
HEMATOCRIT: 34.8 % — AB (ref 35.0–47.0)
Hemoglobin: 11.5 g/dL — ABNORMAL LOW (ref 12.0–16.0)
MCH: 25.3 pg — AB (ref 26.0–34.0)
MCHC: 33 g/dL (ref 32.0–36.0)
MCV: 76.5 fL — AB (ref 80.0–100.0)
Platelets: 170 10*3/uL (ref 150–440)
RBC: 4.55 MIL/uL (ref 3.80–5.20)
RDW: 15.3 % — AB (ref 11.5–14.5)
WBC: 7.2 10*3/uL (ref 3.6–11.0)

## 2018-01-10 LAB — BASIC METABOLIC PANEL
Anion gap: 8 (ref 5–15)
BUN: 6 mg/dL (ref 6–20)
CHLORIDE: 108 mmol/L (ref 101–111)
CO2: 17 mmol/L — AB (ref 22–32)
Calcium: 8.1 mg/dL — ABNORMAL LOW (ref 8.9–10.3)
Creatinine, Ser: 0.61 mg/dL (ref 0.44–1.00)
GFR calc Af Amer: >60 mL/min (ref >60)
GFR calc non Af Amer: >60 mL/min (ref >60)
GLUCOSE: 92 mg/dL (ref 65–99)
POTASSIUM: 3.6 mmol/L (ref 3.5–5.1)
SODIUM: 133 mmol/L — AB (ref 135–145)

## 2018-01-10 LAB — HCG, QUANTITATIVE, PREGNANCY: HCG, BETA CHAIN, QUANT, S: 11420 m[IU]/mL — AB (ref <5)

## 2018-01-10 LAB — INFLUENZA PANEL BY PCR (TYPE A & B)
INFLAPCR: NEGATIVE
Influenza B By PCR: NEGATIVE

## 2018-01-10 MED ORDER — CEPHALEXIN 500 MG PO CAPS
ORAL_CAPSULE | ORAL | Status: AC
  Administered 2018-01-10: 500 mg via ORAL
  Filled 2018-01-10: qty 1

## 2018-01-10 MED ORDER — CEPHALEXIN 500 MG PO CAPS
500.0000 mg | ORAL_CAPSULE | Freq: Once | ORAL | Status: AC
  Administered 2018-01-10: 500 mg via ORAL

## 2018-01-10 MED ORDER — CEPHALEXIN 500 MG PO CAPS: 500.0000 mg | ORAL_CAPSULE | Freq: Two times a day (BID) | ORAL | 0 refills | Status: AC

## 2018-01-10 NOTE — ED Provider Notes (Signed)
[facility] Emergency Department Provider Note  ____________________________________________   First MD Initiated Contact with Patient 01/10/18 1412     (approximate)  I have reviewed the triage vital signs and the nursing notes.   HISTORY  Chief Complaint Weakness; Cough; and Fever   HPI Hannah Ball is a 27 y.o. female who is [redacted] weeks pregnant who is coming in with 1-1/2 days of fever, body aches as well as dizziness and lightheadedness.  Says that she is also had a cough and mild nasal congestion with rhinorrhea.  Mild throat pain with coughing.  Says that she became concerned today because while in the shower she began to feel dizzy and like she was going to pass out.  She says that she then slipped when exiting the tub and did not fall, pass out or hit her head or abdomen but said that she did a "half split."  She is concerned about the child although she is feeling the baby move and has not had any bleeding or rush of fluid from the vagina.  No abdominal pain at this time.  Says that she has an 49-month-old who was recently diagnosed with RSV.  Patient said that she had a fever yesterday but none today.  Past Medical History:  Diagnosis Date  . Nausea/vomiting in pregnancy 10/26/2016   Trial of diclegis   . Ovarian cyst   . Type A blood, Rh negative     Patient Active Problem List   Diagnosis Date Noted  . Fetal bilateral renal pyelectasis 11/26/2017  . History of macrosomia in infant in prior pregnancy, currently pregnant 09/26/2017  . Short interval between pregnancies affecting pregnancy, antepartum 08/29/2017  . Rubella non-immune status, antepartum 08/14/2017  . Supervision of other normal pregnancy, antepartum 08/06/2017  . Rh negative, antepartum 10/16/2016    Past Surgical History:  Procedure Laterality Date  . LAPAROSCOPIC OVARIAN CYSTECTOMY    . OVARIAN CYST REMOVAL  2008    Prior to Admission medications   Medication Sig  Start Date End Date Taking? Authorizing Provider  docusate sodium (COLACE) 100 MG capsule Take 1 capsule (100 mg total) by mouth 2 (two) times daily as needed for mild constipation or moderate constipation. 10/10/17   Anyanwu, Jethro Bastos, MD  Doxylamine-Pyridoxine ER (BONJESTA) 20-20 MG TBCR Take 1 tablet by mouth every 2 (two) hours. Take one tab at night, may add another midday if Sx persist Patient not taking: Reported on 01/08/2018 08/06/17   Anyanwu, Jethro Bastos, MD  Prenatal Vit-Fe Fumarate-FA (PREPLUS) 27-1 MG TABS Take 1 tablet by mouth daily. 08/08/17   Anyanwu, Jethro Bastos, MD  ranitidine (ZANTAC) 150 MG tablet Take 1 tablet (150 mg total) by mouth 2 (two) times daily. 10/24/17   Dunseith Bing, MD    Allergies Benadryl [diphenhydramine hcl (sleep)]; Latex; Neosporin plus max st; Neosporin wound cleanser [benzalkonium chloride]; and Penicillins  Family History  Problem Relation Age of Onset  . Diabetes Maternal Grandfather     Social History Social History   Tobacco Use  . Smoking status: Never Smoker  . Smokeless tobacco: Never Used  Substance Use Topics  . Alcohol use: No  . Drug use: No    Review of Systems  Constitutional: As above Eyes: No visual changes. ENT: No sore throat. Cardiovascular: Denies chest pain. Respiratory: Cough as above Gastrointestinal: No abdominal pain.  No nausea, no vomiting.  No diarrhea.  No constipation. Genitourinary: Negative for dysuria. Musculoskeletal: Negative for back pain. Skin: Negative  for rash. Neurological: Negative for headaches, focal weakness or numbness.   ____________________________________________   PHYSICAL EXAM:  VITAL SIGNS: ED Triage Vitals  Enc Vitals Group     BP 01/10/18 1350 (!) 108/56     Pulse Rate 01/10/18 1350 85     Resp 01/10/18 1350 16     Temp 01/10/18 1350 98.4 F (36.9 C)     Temp Source 01/10/18 1350 Oral     SpO2 01/10/18 1350 98 %     Weight 01/10/18 1351 203 lb (92.1 kg)     Height  01/10/18 1351 5\' 8"  (1.727 m)     Head Circumference --      Peak Flow --      Pain Score 01/10/18 1411 6     Pain Loc --      Pain Edu? --      Excl. in GC? --     Constitutional: Alert and oriented. Well appearing and in no acute distress. Eyes: Conjunctivae are normal.  Head: Atraumatic. Nose: Mild rhinorrhea to the bilateral nares. Mouth/Throat: Mucous membranes are moist.  No pharyngeal erythema or exudate.  No swollen structures of the posterior pharynx. Neck: No stridor.   Cardiovascular: Normal rate, regular rhythm. Grossly normal heart sounds.  Good peripheral circulation. Respiratory: Normal respiratory effort.  No retractions. Lungs CTAB. Gastrointestinal: Soft and nontender.  Gravid uterus palpated without any tenderness or bogginess.  No distention. No CVA tenderness. Musculoskeletal: No lower extremity tenderness nor edema.  No joint effusions. Neurologic:  Normal speech and language. No gross focal neurologic deficits are appreciated. Skin:  Skin is warm, dry and intact. No rash noted. Psychiatric: Mood and affect are normal. Speech and behavior are normal.  ____________________________________________   LABS (all labs ordered are listed, but only abnormal results are displayed)  Labs Reviewed  BASIC METABOLIC PANEL - Abnormal; Notable for the following components:      Result Value   Sodium 133 (*)    CO2 17 (*)    Calcium 8.1 (*)    All other components within normal limits  CBC - Abnormal; Notable for the following components:   Hemoglobin 11.5 (*)    HCT 34.8 (*)    MCV 76.5 (*)    MCH 25.3 (*)    RDW 15.3 (*)    All other components within normal limits  URINALYSIS, COMPLETE (UACMP) WITH MICROSCOPIC - Abnormal; Notable for the following components:   Color, Urine AMBER (*)    APPearance HAZY (*)    Protein, ur 30 (*)    Leukocytes, UA MODERATE (*)    Bacteria, UA RARE (*)    Squamous Epithelial / LPF 6-30 (*)    All other components within normal  limits  HCG, QUANTITATIVE, PREGNANCY - Abnormal; Notable for the following components:   hCG, Beta Chain, Quant, S 11,420 (*)    All other components within normal limits  URINE CULTURE  INFLUENZA PANEL BY PCR (TYPE A & B)   ____________________________________________  EKG  ED ECG REPORT I, Arelia Longestavid M Shadasia Oldfield, the attending physician, personally viewed and interpreted this ECG.   Date: 01/10/2018  EKG Time: 1444  Rate: 113  Rhythm: sinus tachycardia  Axis: Normal  Intervals:none  ST&T Change: No ST segment elevation or depression.  No abnormal T wave inversion.  ____________________________________________  RADIOLOGY   ____________________________________________   PROCEDURES  Procedure(s) performed:   Procedures  Critical Care performed:   ____________________________________________   INITIAL IMPRESSION / ASSESSMENT AND PLAN / ED  COURSE  Pertinent labs & imaging results that were available during my care of the patient were reviewed by me and considered in my medical decision making (see chart for details).  DDX: Dehydration, viral syndrome, influenza, upper respiratory infection, near syncope As part of my medical decision making, I reviewed the following data within the electronic MEDICAL RECORD NUMBER Notes from prior office visits  ----------------------------------------- 4:37 PM on 01/10/2018 -----------------------------------------  Patient at this time with reassuring fetal heart tones of 145.  Question of a UTI.  Will cover with Keflex.  Otherwise, the patient likely has a viral syndrome.  Will be discharged to home.  Likely nursing be secondary to viral issue and likely dehydration.  Recommended the patient drink plenty fluids.  She is understanding this plan willing to comply.  Says that she is tried multiple prenatal vitamins and is not taking any at this time secondary to multiple bad reactions including nausea and previous  pregnancies. ____________________________________________   FINAL CLINICAL IMPRESSION(S) / ED DIAGNOSES  UTI.  near syncope.  Myalgia.    NEW MEDICATIONS STARTED DURING THIS VISIT:  New Prescriptions   No medications on file     Note:  This document was prepared using Dragon voice recognition software and may include unintentional dictation errors.     Myrna Blazer, MD 01/10/18 [phone]

## 2018-01-10 NOTE — ED Triage Notes (Signed)
Pt arrived with concerns over flu like symptoms and episode where patient became "very dizzy" and slipped after the shower prior to arrival. Pt denies hitting head or loss of consciousness but states she wanted to "make sure the baby is ok." Pt is [redacted] weeks pregnant. Pt denies any and injury from slipping and has reported fetal movement since.

## 2018-01-11 ENCOUNTER — Inpatient Hospital Stay (HOSPITAL_COMMUNITY)
Admission: AD | Admit: 2018-01-11 | Discharge: 2018-01-11 | Disposition: A | Discharge status: Home / Self Care | Payer: Medicaid Other | Source: Ambulatory Visit | Attending: Obstetrics & Gynecology | Admitting: Obstetrics & Gynecology

## 2018-01-11 ENCOUNTER — Encounter (HOSPITAL_COMMUNITY): Payer: Self-pay | Admitting: *Deleted

## 2018-01-11 DIAGNOSIS — Z79899 Other long term (current) drug therapy: Secondary | ICD-10-CM | POA: Insufficient documentation

## 2018-01-11 DIAGNOSIS — O26892 Other specified pregnancy related conditions, second trimester: Secondary | ICD-10-CM | POA: Diagnosis not present

## 2018-01-11 DIAGNOSIS — O09899 Supervision of other high risk pregnancies, unspecified trimester: Secondary | ICD-10-CM

## 2018-01-11 DIAGNOSIS — R69 Illness, unspecified: Secondary | ICD-10-CM

## 2018-01-11 DIAGNOSIS — Z3480 Encounter for supervision of other normal pregnancy, unspecified trimester: Secondary | ICD-10-CM

## 2018-01-11 DIAGNOSIS — R109 Unspecified abdominal pain: Secondary | ICD-10-CM | POA: Diagnosis not present

## 2018-01-11 DIAGNOSIS — Z888 Allergy status to other drugs, medicaments and biological substances status: Secondary | ICD-10-CM | POA: Diagnosis not present

## 2018-01-11 DIAGNOSIS — Z880 Allergy status to penicillin: Secondary | ICD-10-CM | POA: Diagnosis not present

## 2018-01-11 DIAGNOSIS — J111 Influenza due to unidentified influenza virus with other respiratory manifestations: Secondary | ICD-10-CM | POA: Diagnosis not present

## 2018-01-11 DIAGNOSIS — O9989 Other specified diseases and conditions complicating pregnancy, childbirth and the puerperium: Secondary | ICD-10-CM

## 2018-01-11 DIAGNOSIS — O09299 Supervision of pregnancy with other poor reproductive or obstetric history, unspecified trimester: Secondary | ICD-10-CM

## 2018-01-11 DIAGNOSIS — Z3A26 26 weeks gestation of pregnancy: Secondary | ICD-10-CM | POA: Insufficient documentation

## 2018-01-11 DIAGNOSIS — O99512 Diseases of the respiratory system complicating pregnancy, second trimester: Secondary | ICD-10-CM | POA: Insufficient documentation

## 2018-01-11 DIAGNOSIS — Z283 Underimmunization status: Secondary | ICD-10-CM

## 2018-01-11 DIAGNOSIS — Z789 Other specified health status: Secondary | ICD-10-CM | POA: Insufficient documentation

## 2018-01-11 DIAGNOSIS — R51 Headache: Secondary | ICD-10-CM | POA: Diagnosis present

## 2018-01-11 DIAGNOSIS — Z2839 Other underimmunization status: Secondary | ICD-10-CM

## 2018-01-11 LAB — INFLUENZA PANEL BY PCR (TYPE A & B)
INFLBPCR: NEGATIVE
Influenza A By PCR: NEGATIVE

## 2018-01-11 LAB — URINALYSIS, ROUTINE W REFLEX MICROSCOPIC
Bilirubin Urine: NEGATIVE
GLUCOSE, UA: NEGATIVE mg/dL
KETONES UR: NEGATIVE mg/dL
Nitrite: NEGATIVE
PH: 6 (ref 5.0–8.0)
Protein, ur: 30 mg/dL — AB
SPECIFIC GRAVITY, URINE: 1.016 (ref 1.005–1.030)

## 2018-01-11 MED ORDER — BENZONATATE 100 MG PO CAPS: 100.0000 mg | ORAL_CAPSULE | Freq: Three times a day (TID) | ORAL | 0 refills | Status: DC

## 2018-01-11 MED ORDER — OSELTAMIVIR PHOSPHATE 75 MG PO CAPS: 75.0000 mg | ORAL_CAPSULE | Freq: Two times a day (BID) | ORAL | 0 refills | Status: DC

## 2018-01-11 NOTE — MAU Note (Addendum)
Has felt bad for couple days. Dizzy yesterday and slipped but did not fall. Was seen at [facility] yest and flu swab negative. Still feels bad. Can keep down flds but cannot eat much. A lot of heartburn. Child had RSV first of Feb. Coughing a lot. Headache and pelvic pressure. Told had uti yesterday. Has not started antibiotice yet except first dose at hosp. Leaks fld sometimes when coughs but unsure if is urine or not. No vag bleeding

## 2018-01-11 NOTE — Discharge Instructions (Signed)

## 2018-01-11 NOTE — MAU Provider Note (Signed)
History     CSN: [id number]  Arrival date and time: 01/11/18 2037   First Provider Initiated Contact with Patient 01/11/18 2142     Chief Complaint  Patient presents with  . Headache  . Generalized Body Aches  . Abdominal Pain   HPI Hannah Ball is a 27 y.o. G3P2002 at 5358w0d who presents with cough, body aches, chills and headache. She states the symptoms started on Wednesday and have continued to get worse. She denies any exposure to anyone with similar symptoms. She reports a lack of appetite but she is able to keep fluids down. She went to [facility] last night and had a negative flu swab but states she "doesn't trust what they told me." She is wanting to be treated for the flu. She denies any abdominal pain, vaginal bleeding or leaking of fluid. Reports good fetal movement.  OB History    Gravida Para Term Preterm AB Living   3 2 2  0 0 2   SAB TAB Ectopic Multiple Live Births   0 0 0 0 2      Past Medical History:  Diagnosis Date  . Nausea/vomiting in pregnancy 10/26/2016   Trial of diclegis   . Ovarian cyst   . Type A blood, Rh negative     Past Surgical History:  Procedure Laterality Date  . LAPAROSCOPIC OVARIAN CYSTECTOMY    . OVARIAN CYST REMOVAL  2008    Family History  Problem Relation Age of Onset  . Diabetes Maternal Grandfather     Social History   Tobacco Use  . Smoking status: Never Smoker  . Smokeless tobacco: Never Used  Substance Use Topics  . Alcohol use: No  . Drug use: No    Allergies:  Allergies  Allergen Reactions  . Benadryl [Diphenhydramine Hcl (Sleep)] Hives  . Latex Hives  . Neosporin Plus Max St Other (See Comments)    Skin burns  . Neosporin Wound Cleanser [Benzalkonium Chloride] Hives  . Penicillins Rash    Has patient had a PCN reaction causing immediate rash, facial/tongue/throat swelling, SOB or lightheadedness with hypotension: Yes Has patient had a PCN reaction causing severe rash involving mucus  membranes or skin necrosis: No Has patient had a PCN reaction that required hospitalization No Has patient had a PCN reaction occurring within the last 10 years: No If all of the above answers are "NO", then may proceed with Cephalosporin use.     Medications Prior to Admission  Medication Sig Dispense Refill Last Dose  . cephALEXin (KEFLEX) 500 MG capsule Take 1 capsule (500 mg total) by mouth 2 (two) times daily for 10 days. 14 capsule 0   . docusate sodium (COLACE) 100 MG capsule Take 1 capsule (100 mg total) by mouth 2 (two) times daily as needed for mild constipation or moderate constipation. 30 capsule 2 Taking  . Doxylamine-Pyridoxine ER (BONJESTA) 20-20 MG TBCR Take 1 tablet by mouth every 2 (two) hours. Take one tab at night, may add another midday if Sx persist (Patient not taking: Reported on 01/08/2018) 30 tablet 6 Not Taking  . Prenatal Vit-Fe Fumarate-FA (PREPLUS) 27-1 MG TABS Take 1 tablet by mouth daily. 30 tablet 13 Taking  . ranitidine (ZANTAC) 150 MG tablet Take 1 tablet (150 mg total) by mouth 2 (two) times daily. 60 tablet 4 Taking    Review of Systems  Constitutional: Positive for appetite change, chills and fatigue. Negative for fever.  HENT: Positive for congestion and sneezing.   Respiratory:  Positive for cough. Negative for shortness of breath.   Cardiovascular: Negative.  Negative for chest pain.  Gastrointestinal: Negative.  Negative for abdominal pain, constipation, diarrhea, nausea and vomiting.  Genitourinary: Negative.  Negative for dysuria, vaginal bleeding and vaginal discharge.  Neurological: Positive for headaches. Negative for dizziness.   Physical Exam   Blood pressure (!) 108/57, pulse (!) 116, temperature 100 F (37.8 C), resp. rate 18, height 5\' 8"  (1.727 m), weight 203 lb (92.1 kg), last menstrual period 06/30/2017, SpO2 98 %, not currently breastfeeding.  Physical Exam  Nursing note and vitals reviewed. Constitutional: She is oriented to  person, place, and time. She appears well-developed and well-nourished. No distress.  HENT:  Head: Normocephalic.  Eyes: Pupils are equal, round, and reactive to light.  Cardiovascular: Normal rate, regular rhythm and normal heart sounds.  Respiratory: Effort normal and breath sounds normal. No respiratory distress.  GI: Soft. Bowel sounds are normal. She exhibits no distension. There is no tenderness.  Neurological: She is alert and oriented to person, place, and time.  Skin: Skin is warm and dry.  Psychiatric: She has a normal mood and affect. Her behavior is normal. Judgment and thought content normal.   Fetal Tracing:  Baseline: 150 Variability: moderate Accels: 15x15 Decels: none  Toco: none   MAU Course  Procedures Results for orders placed or performed during the hospital encounter of 01/11/18 (from the past 24 hour(s))  Urinalysis, Routine w reflex microscopic     Status: Abnormal   Collection Time: 01/11/18  9:01 PM  Result Value Ref Range   Color, Urine YELLOW YELLOW   APPearance CLOUDY (A) CLEAR   Specific Gravity, Urine 1.016 1.005 - 1.030   pH 6.0 5.0 - 8.0   Glucose, UA NEGATIVE NEGATIVE mg/dL   Hgb urine dipstick SMALL (A) NEGATIVE   Bilirubin Urine NEGATIVE NEGATIVE   Ketones, ur NEGATIVE NEGATIVE mg/dL   Protein, ur 30 (A) NEGATIVE mg/dL   Nitrite NEGATIVE NEGATIVE   Leukocytes, UA LARGE (A) NEGATIVE   RBC / HPF 0-5 0 - 5 RBC/hpf   WBC, UA 6-30 0 - 5 WBC/hpf   Bacteria, UA MANY (A) NONE SEEN   Squamous Epithelial / LPF 6-30 (A) NONE SEEN   Mucus PRESENT   Influenza panel by PCR (type A & B)     Status: None   Collection Time: 01/11/18  9:31 PM  Result Value Ref Range   Influenza A By PCR NEGATIVE NEGATIVE   Influenza B By PCR NEGATIVE NEGATIVE   MDM UA Influenza PCR NST reassuring for gestational age Patient requesting treatment for flu even though swab negative. Risks and benefits of treatment reviewed with patient. Patient desires treatment.    Assessment and Plan   1. Influenza-like illness   2. History of macrosomia in infant in prior pregnancy, currently pregnant   3. Short interval between pregnancies affecting pregnancy, antepartum   4. Rubella non-immune status, antepartum   5. Supervision of other normal pregnancy, antepartum   6. [redacted] weeks gestation of pregnancy    -Discharge home in stable condition -Rx for tamiflu and tessalon perles given to patient -Flu precautions discussed -Patient advised to follow-up with [facility] as scheduled for prenatal care -Patient may return to MAU as needed or if her condition were to change or worsen  Rolm Bookbinder CNM 01/11/2018, 9:50 PM

## 2018-01-12 LAB — URINE CULTURE

## 2018-01-30 ENCOUNTER — Encounter: Payer: Self-pay | Admitting: Family Medicine

## 2018-01-30 ENCOUNTER — Ambulatory Visit (INDEPENDENT_AMBULATORY_CARE_PROVIDER_SITE_OTHER): Payer: Medicaid Other | Admitting: Family Medicine

## 2018-01-30 DIAGNOSIS — Z3483 Encounter for supervision of other normal pregnancy, third trimester: Secondary | ICD-10-CM

## 2018-01-30 DIAGNOSIS — O360930 Maternal care for other rhesus isoimmunization, third trimester, not applicable or unspecified: Secondary | ICD-10-CM | POA: Diagnosis not present

## 2018-01-30 DIAGNOSIS — Z23 Encounter for immunization: Secondary | ICD-10-CM

## 2018-01-30 DIAGNOSIS — Z3480 Encounter for supervision of other normal pregnancy, unspecified trimester: Secondary | ICD-10-CM

## 2018-01-30 MED ORDER — RHO D IMMUNE GLOBULIN 1500 UNIT/2ML IJ SOSY
300.0000 ug | PREFILLED_SYRINGE | Freq: Once | INTRAMUSCULAR | Status: AC
  Administered 2018-01-30: 300 ug via INTRAMUSCULAR

## 2018-01-30 NOTE — Progress Notes (Signed)
   PRENATAL VISIT NOTE  Subjective:  Hannah Ball is a 27 y.o. G3P2002 at 1475w5d being seen today for ongoing prenatal care.  She is currently monitored for the following issues for this low-risk pregnancy and has Rh negative, antepartum; Supervision of other normal pregnancy, antepartum; Rubella non-immune status, antepartum; Short interval between pregnancies affecting pregnancy, antepartum; History of macrosomia in infant in prior pregnancy, currently pregnant; and Fetal bilateral renal pyelectasis on their problem list.  Patient reports no complaints.  Contractions: Irregular. Vag. Bleeding: None.  Movement: Present. Denies leaking of fluid.   The following portions of the patient's history were reviewed and updated as appropriate: allergies, current medications, past family history, past medical history, past social history, past surgical history and problem list. Problem list updated.  Objective:   Vitals:   01/30/18 0908  BP: 112/65  Pulse: (!) 115  Weight: 205 lb (93 kg)    Fetal Status: Fetal Heart Rate (bpm): 148   Movement: Present     General:  Alert, oriented and cooperative. Patient is in no acute distress.  Skin: Skin is warm and dry. No rash noted.   Cardiovascular: Normal heart rate noted  Respiratory: Normal respiratory effort, no problems with respiration noted  Abdomen: Soft, gravid, appropriate for gestational age.  Pain/Pressure: Present     Pelvic: Cervical exam deferred        Extremities: Normal range of motion.     Mental Status: Normal mood and affect. Normal behavior. Normal judgment and thought content.   Assessment and Plan:  Pregnancy: G3P2002 at 2175w5d  1. Supervision of other normal pregnancy, antepartum - Rhogam and Flu today - Antibody screen - Flu Vaccine QUAD 36+ mos IM (Fluarix, Quad PF) - NST today due to complaint of contractions-- none on monitor and otherwise reactive and reassuring.   2. Renal pyelectasis - pt aware of fu  US  Preterm labor symptoms and general obstetric precautions including but not limited to vaginal bleeding, contractions, leaking of fluid and fetal movement were reviewed in detail with the patient. Please refer to After Visit Summary for other counseling recommendations.  Return in about 2 weeks (around 02/13/2018) for Routine prenatal care.  Future Appointments  Date Time Provider Department Center  02/08/2018 10:15 AM [facility] US 4 [facility]  02/13/2018  9:30 AM Federico FlakeNewton, Trenisha Lafavor Niles, MD [facility]    Federico FlakeKimberly Niles Nuria Phebus, MD

## 2018-01-31 LAB — ANTIBODY SCREEN: ANTIBODY SCREEN: NEGATIVE

## 2018-02-08 ENCOUNTER — Other Ambulatory Visit: Payer: Self-pay | Admitting: Family Medicine

## 2018-02-08 ENCOUNTER — Ambulatory Visit (HOSPITAL_COMMUNITY)
Admission: RE | Admit: 2018-02-08 | Discharge: 2018-02-08 | Disposition: A | Discharge status: Home / Self Care | Payer: Medicaid Other | Source: Ambulatory Visit | Attending: Family Medicine | Admitting: Family Medicine

## 2018-02-08 DIAGNOSIS — Z362 Encounter for other antenatal screening follow-up: Secondary | ICD-10-CM | POA: Diagnosis not present

## 2018-02-08 DIAGNOSIS — Z3480 Encounter for supervision of other normal pregnancy, unspecified trimester: Secondary | ICD-10-CM

## 2018-02-08 DIAGNOSIS — O359XX0 Maternal care for (suspected) fetal abnormality and damage, unspecified, not applicable or unspecified: Secondary | ICD-10-CM

## 2018-02-08 DIAGNOSIS — Z3A30 30 weeks gestation of pregnancy: Secondary | ICD-10-CM

## 2018-02-08 DIAGNOSIS — O358XX0 Maternal care for other (suspected) fetal abnormality and damage, not applicable or unspecified: Secondary | ICD-10-CM | POA: Diagnosis present

## 2018-02-13 ENCOUNTER — Encounter: Payer: Self-pay | Admitting: Family Medicine

## 2018-02-13 ENCOUNTER — Ambulatory Visit (INDEPENDENT_AMBULATORY_CARE_PROVIDER_SITE_OTHER): Payer: Medicaid Other | Admitting: Family Medicine

## 2018-02-13 VITALS — BP 96/61 | HR 101 | Wt 206.0 lb

## 2018-02-13 DIAGNOSIS — O26893 Other specified pregnancy related conditions, third trimester: Secondary | ICD-10-CM

## 2018-02-13 DIAGNOSIS — O09899 Supervision of other high risk pregnancies, unspecified trimester: Secondary | ICD-10-CM

## 2018-02-13 DIAGNOSIS — Z3483 Encounter for supervision of other normal pregnancy, third trimester: Secondary | ICD-10-CM

## 2018-02-13 DIAGNOSIS — O35EXX0 Maternal care for other (suspected) fetal abnormality and damage, fetal genitourinary anomalies, not applicable or unspecified: Secondary | ICD-10-CM

## 2018-02-13 DIAGNOSIS — O9989 Other specified diseases and conditions complicating pregnancy, childbirth and the puerperium: Secondary | ICD-10-CM

## 2018-02-13 DIAGNOSIS — Z6791 Unspecified blood type, Rh negative: Secondary | ICD-10-CM

## 2018-02-13 DIAGNOSIS — O26899 Other specified pregnancy related conditions, unspecified trimester: Secondary | ICD-10-CM

## 2018-02-13 DIAGNOSIS — O09293 Supervision of pregnancy with other poor reproductive or obstetric history, third trimester: Secondary | ICD-10-CM

## 2018-02-13 DIAGNOSIS — Z283 Underimmunization status: Secondary | ICD-10-CM

## 2018-02-13 DIAGNOSIS — O09299 Supervision of pregnancy with other poor reproductive or obstetric history, unspecified trimester: Secondary | ICD-10-CM

## 2018-02-13 DIAGNOSIS — Z2839 Other underimmunization status: Secondary | ICD-10-CM

## 2018-02-13 DIAGNOSIS — Z3480 Encounter for supervision of other normal pregnancy, unspecified trimester: Secondary | ICD-10-CM

## 2018-02-13 DIAGNOSIS — O358XX0 Maternal care for other (suspected) fetal abnormality and damage, not applicable or unspecified: Secondary | ICD-10-CM

## 2018-02-13 DIAGNOSIS — O09893 Supervision of other high risk pregnancies, third trimester: Secondary | ICD-10-CM

## 2018-02-13 NOTE — Progress Notes (Signed)
   PRENATAL VISIT NOTE  Subjective:  Hannah Ball is a 27 y.o. G3P2002 at 4974w5d being seen today for ongoing prenatal care.  She is currently monitored for the following issues for this low-risk pregnancy and has Rh negative, antepartum; Supervision of other normal pregnancy, antepartum; Rubella non-immune status, antepartum; Short interval between pregnancies affecting pregnancy, antepartum; History of macrosomia in infant in prior pregnancy, currently pregnant; and Fetal bilateral renal pyelectasis on their problem list.  Patient reports no complaints.  Contractions: Not present. Vag. Bleeding: None.  Movement: Present. Denies leaking of fluid.   The following portions of the patient's history were reviewed and updated as appropriate: allergies, current medications, past family history, past medical history, past social history, past surgical history and problem list. Problem list updated.  Objective:   Vitals:   02/13/18 1000  BP: 96/61  Pulse: (!) 101  Weight: 206 lb (93.4 kg)    Fetal Status: Fetal Heart Rate (bpm): 141 Fundal Height: 32 cm Movement: Present     General:  Alert, oriented and cooperative. Patient is in no acute distress.  Skin: Skin is warm and dry. No rash noted.   Cardiovascular: Normal heart rate noted  Respiratory: Normal respiratory effort, no problems with respiration noted  Abdomen: Soft, gravid, appropriate for gestational age.  Pain/Pressure: Absent     Pelvic: Cervical exam deferred        Extremities: Normal range of motion.     Mental Status: Normal mood and affect. Normal behavior. Normal judgment and thought content.   Assessment and Plan:  Pregnancy: G3P2002 at 5474w5d  1. History of macrosomia in infant in prior pregnancy, currently pregnant 10#12oz infant at 41 weeks by vacuum. Mother is worried about another big baby. We discussed inability to predict BW for infant. Patient would like to avoid a vacuum if possible. Briefly reviewed  possible IOL at 40wk if favorable but also cautioned on this increasing risk of CS/interventions. Continue to discuss with patient and we ultimately hope for spontaneous labor  2. Short interval between pregnancies affecting pregnancy, antepartum  3. Rubella non-immune status, antepartum  4. Supervision of other normal pregnancy, antepartum UTD  5. Rh negative, antepartum Received rhogam  Preterm labor symptoms and general obstetric precautions including but not limited to vaginal bleeding, contractions, leaking of fluid and fetal movement were reviewed in detail with the patient. Please refer to After Visit Summary for other counseling recommendations.  Return in about 2 weeks (around 02/27/2018) for Routine prenatal care.  Future Appointments  Date Time Provider Department Center  02/27/2018  9:30 AM Federico FlakeNewton, Whitten Andreoni Niles, MD [facility]    Federico FlakeKimberly Niles Allure Greaser, MD

## 2018-02-27 ENCOUNTER — Ambulatory Visit (INDEPENDENT_AMBULATORY_CARE_PROVIDER_SITE_OTHER): Payer: Medicaid Other | Admitting: Family Medicine

## 2018-02-27 ENCOUNTER — Encounter: Payer: Self-pay | Admitting: Family Medicine

## 2018-02-27 VITALS — BP 101/64 | HR 93 | Wt 208.0 lb

## 2018-02-27 DIAGNOSIS — Z3483 Encounter for supervision of other normal pregnancy, third trimester: Secondary | ICD-10-CM

## 2018-02-27 DIAGNOSIS — O09893 Supervision of other high risk pregnancies, third trimester: Secondary | ICD-10-CM

## 2018-02-27 DIAGNOSIS — Z3480 Encounter for supervision of other normal pregnancy, unspecified trimester: Secondary | ICD-10-CM

## 2018-02-27 DIAGNOSIS — O09899 Supervision of other high risk pregnancies, unspecified trimester: Secondary | ICD-10-CM

## 2018-02-27 DIAGNOSIS — O09299 Supervision of pregnancy with other poor reproductive or obstetric history, unspecified trimester: Secondary | ICD-10-CM

## 2018-02-27 DIAGNOSIS — O09293 Supervision of pregnancy with other poor reproductive or obstetric history, third trimester: Secondary | ICD-10-CM

## 2018-02-27 DIAGNOSIS — O358XX0 Maternal care for other (suspected) fetal abnormality and damage, not applicable or unspecified: Secondary | ICD-10-CM

## 2018-02-27 DIAGNOSIS — O35EXX0 Maternal care for other (suspected) fetal abnormality and damage, fetal genitourinary anomalies, not applicable or unspecified: Secondary | ICD-10-CM

## 2018-02-27 NOTE — Progress Notes (Signed)
   PRENATAL VISIT NOTE  Subjective:  Hannah Ball is a 27 y.o. G3P2002 at [redacted]w[redacted]d being seen today for ongoing prenatal care.  She is currently monitored for the following issues for this low-risk pregnancy and has Rh negative, antepartum; Supervision of other normal pregnancy, antepartum; Rubella non-immune status, antepartum; Short interval between pregnancies affecting pregnancy, antepartum; History of macrosomia in infant in prior pregnancy, currently pregnant; and Fetal bilateral renal pyelectasis on their problem list.  Patient reports no complaints.  Contractions: Irregular. Vag. Bleeding: None.  Movement: Present. Denies leaking of fluid.   The following portions of the patient's history were reviewed and updated as appropriate: allergies, current medications, past family history, past medical history, past social history, past surgical history and problem list. Problem list updated.  Objective:   Vitals:   02/27/18 0931  BP: 101/64  Pulse: 93  Weight: 208 lb (94.3 kg)    Fetal Status: Fetal Heart Rate (bpm): 152 Fundal Height: 33 cm Movement: Present  Presentation: Vertex  General:  Alert, oriented and cooperative. Patient is in no acute distress.  Skin: Skin is warm and dry. No rash noted.   Cardiovascular: Normal heart rate noted  Respiratory: Normal respiratory effort, no problems with respiration noted  Abdomen: Soft, gravid, appropriate for gestational age.  Pain/Pressure: Present     Pelvic: Cervical exam deferred        Extremities: Normal range of motion.     Mental Status: Normal mood and affect. Normal behavior. Normal judgment and thought content.   Assessment and Plan:  Pregnancy: G3P2002 at [redacted]w[redacted]d  1. Supervision of other normal pregnancy, antepartum UTD   2. Fetal bilateral renal pyelectasis resolved  3. Short interval between pregnancies affecting pregnancy, antepartum  4. History of macrosomia in infant in prior pregnancy, currently  pregnant Patient is thinking about elective IOL at 39 weeks.  We discussed the ARRIVE trial   Preterm labor symptoms and general obstetric precautions including but not limited to vaginal bleeding, contractions, leaking of fluid and fetal movement were reviewed in detail with the patient. Please refer to After Visit Summary for other counseling recommendations.  Return in about 2 weeks (around 03/13/2018) for Routine prenatal care.  Future Appointments  Date Time Provider Department Center  03/13/2018 10:45 AM Federico Flake, MD [facility]    Federico Flake, MD

## 2018-02-27 NOTE — Patient Instructions (Signed)

## 2018-03-10 ENCOUNTER — Other Ambulatory Visit: Payer: Self-pay

## 2018-03-10 ENCOUNTER — Inpatient Hospital Stay (HOSPITAL_COMMUNITY)
Admission: AD | Admit: 2018-03-10 | Discharge: 2018-03-10 | Disposition: A | Discharge status: Home / Self Care | Payer: Medicaid Other | Source: Ambulatory Visit | Attending: Obstetrics & Gynecology | Admitting: Obstetrics & Gynecology

## 2018-03-10 ENCOUNTER — Encounter (HOSPITAL_COMMUNITY): Payer: Self-pay

## 2018-03-10 DIAGNOSIS — O26899 Other specified pregnancy related conditions, unspecified trimester: Secondary | ICD-10-CM

## 2018-03-10 DIAGNOSIS — O4703 False labor before 37 completed weeks of gestation, third trimester: Secondary | ICD-10-CM | POA: Insufficient documentation

## 2018-03-10 DIAGNOSIS — Z3A34 34 weeks gestation of pregnancy: Secondary | ICD-10-CM | POA: Insufficient documentation

## 2018-03-10 DIAGNOSIS — R102 Pelvic and perineal pain: Secondary | ICD-10-CM

## 2018-03-10 LAB — URINALYSIS, ROUTINE W REFLEX MICROSCOPIC
BILIRUBIN URINE: NEGATIVE
GLUCOSE, UA: NEGATIVE mg/dL
HGB URINE DIPSTICK: NEGATIVE
Ketones, ur: NEGATIVE mg/dL
NITRITE: NEGATIVE
Protein, ur: 30 mg/dL — AB
SPECIFIC GRAVITY, URINE: 1.021 (ref 1.005–1.030)
WBC, UA: >50 WBC/hpf — ABNORMAL HIGH (ref 0–5)
pH: 5 (ref 5.0–8.0)

## 2018-03-10 NOTE — Discharge Instructions (Signed)

## 2018-03-10 NOTE — MAU Provider Note (Signed)
History     CSN: [id number]  Arrival date and time: 03/10/18 1725   First Provider Initiated Contact with Patient 03/10/18 1807      Chief Complaint  Patient presents with  . vaginal pressure  . Abdominal Pain   Hannah Ball is a 27 y.o. G3P2002 at [redacted]w[redacted]d presenting with vaginal pressure, groin pain and feeling like she needs to push.  The pressure and pain began last night though she has had similar less intense pressure over the past several days.  It is alleviated by rest and exacerbated by standing.  The groin pain is worse with changing positions and walking.  The pain waxes and wanes and at present is dull and mild.  Pain does not radiate.  She has done no self treatment.  Denies upper abdominal pain, vaginal bleeding, leakage of fluid or dysuria.  Good fetal movement. Pregnancy essentially uncomplicated.     OB History  Gravida Para Term Preterm AB Living  0 0 2  SAB TAB Ectopic Multiple Live Births  0 0 0 0 2    # Outcome Date GA Lbr Len/2nd Weight Sex Delivery Anes PTL Lv  3 Current           2 Term 05/09/17 [redacted]w[redacted]d 11:49 / 02:13 10 lb 12.1 oz (4.88 kg) M Vag-Vacuum EPI  LIV     Birth Comments: WNL  1 Term      Vag-Spont EPI  LIV     Complications: Ovarian cyst during pregnancy     Past Medical History:  Diagnosis Date  . Nausea/vomiting in pregnancy 10/26/2016   Trial of diclegis   . Ovarian cyst   . Type A blood, Rh negative     Past Surgical History:  Procedure Laterality Date  . LAPAROSCOPIC OVARIAN CYSTECTOMY    . OVARIAN CYST REMOVAL  2008    Family History  Problem Relation Age of Onset  . Diabetes Maternal Grandfather     Social History   Tobacco Use  . Smoking status: Never Smoker  . Smokeless tobacco: Never Used  Substance Use Topics  . Alcohol use: No  . Drug use: No    Allergies:  Allergies  Allergen Reactions  . Benadryl [Diphenhydramine Hcl (Sleep)] Hives  . Latex Hives  . Neosporin Plus Max St Other (See  Comments)    Skin burns  . Neosporin Wound Cleanser [Benzalkonium Chloride] Hives  . Penicillins Rash    Has patient had a PCN reaction causing immediate rash, facial/tongue/throat swelling, SOB or lightheadedness with hypotension: Yes Has patient had a PCN reaction causing severe rash involving mucus membranes or skin necrosis: No Has patient had a PCN reaction that required hospitalization No Has patient had a PCN reaction occurring within the last 10 years: No If all of the above answers are "NO", then may proceed with Cephalosporin use.     No medications prior to admission.    Review of Systems  Constitutional: Positive for fatigue. Negative for chills and fever.  Respiratory: Negative for cough.   Cardiovascular: Negative for chest pain.  Gastrointestinal: Positive for abdominal pain. Negative for constipation, diarrhea, nausea and vomiting.  Genitourinary: Positive for pelvic pain. Negative for enuresis, flank pain, frequency, vaginal bleeding and vaginal discharge.  Musculoskeletal: Negative for back pain.  Skin: Negative for color change.  Neurological: Negative for light-headedness and headaches.   Physical Exam   Blood pressure 108/60, pulse (!) 113, temperature 97.7 F (36.5 C), temperature source Oral, resp. rate  18, weight 211 lb 0.6 oz (95.7 kg), last menstrual period 06/30/2017, SpO2 98 %, not currently breastfeeding.  Physical Exam  Nursing note and vitals reviewed. Constitutional: She is oriented to person, place, and time. She appears well-developed and well-nourished. No distress.  HENT:  Head: Normocephalic.  Eyes: No scleral icterus.  Neck: Neck supple. No thyromegaly present.  Cardiovascular: Normal rate.  Respiratory: Effort normal.  GI: Soft. There is no tenderness. There is no guarding.  Size c/w 34 wks No suprapubic, groin or other abdominal TTP  Genitourinary: Vagina normal.  Genitourinary Comments: SVE: posterior, ext loose 1, int closed, long,  ballotable, cephalic  Musculoskeletal: She exhibits no edema.  Neurological: She is alert and oriented to person, place, and time.  Skin: Skin is warm and dry.  Psychiatric: She has a normal mood and affect. Her behavior is normal.    MAU Course  Procedures  Results for orders placed or performed during the hospital encounter of 03/10/18 (from the past 24 hour(s))  Urinalysis, Routine w reflex microscopic     Status: Abnormal   Collection Time: 03/10/18  5:31 PM  Result Value Ref Range   Color, Urine YELLOW YELLOW   APPearance CLOUDY (A) CLEAR   Specific Gravity, Urine 1.021 1.005 - 1.030   pH 5.0 5.0 - 8.0   Glucose, UA NEGATIVE NEGATIVE mg/dL   Hgb urine dipstick NEGATIVE NEGATIVE   Bilirubin Urine NEGATIVE NEGATIVE   Ketones, ur NEGATIVE NEGATIVE mg/dL   Protein, ur 30 (A) NEGATIVE mg/dL   Nitrite NEGATIVE NEGATIVE   Leukocytes, UA LARGE (A) NEGATIVE   RBC / HPF 6-10 0 - 5 RBC/hpf   WBC, UA >50 (H) 0 - 5 WBC/hpf   Bacteria, UA FEW (A) NONE SEEN   Squamous Epithelial / LPF 21-50 0 - 5   Mucus PRESENT   OB urine culture sent (specimen appears contaminated and pt without definite UTI sx)  Fetal monitoring Baseline 120-125, moderate variability, accelerations present, no decelerations     Assessment and Plan  G3P2002 at [redacted]w[redacted]d without evidence PTL FWB by EFM 1. False labor before 37 completed weeks of gestation during pregnancy in third trimester, antepartum   2. Pain of round ligament during pregnancy    Allergies as of 03/10/2018      Reactions   Benadryl [diphenhydramine Hcl (sleep)] Hives   Latex Hives   Neosporin Plus Max St Other (See Comments)   Skin burns   Neosporin Wound Cleanser [benzalkonium Chloride] Hives   Penicillins Rash   Has patient had a PCN reaction causing immediate rash, facial/tongue/throat swelling, SOB or lightheadedness with hypotension: Yes Has patient had a PCN reaction causing severe rash involving mucus membranes or skin necrosis:  No Has patient had a PCN reaction that required hospitalization No Has patient had a PCN reaction occurring within the last 10 years: No If all of the above answers are "NO", then may proceed with Cephalosporin use.      Medication List    You have not been prescribed any medications.    Follow-up Information    Center for [facility] Follow up on 03/12/2018.   Specialty:  Obstetrics and Gynecology Contact information: [address] [place] [address] [phone]          Zaley Talley CNM 03/10/2018, 6:08 PM

## 2018-03-10 NOTE — MAU Note (Signed)
Patient states she lost her mucus plug last weekend.  +vaginal pressure States "feels like I have to push"  +Lower abdominal pain Rating pain 5/10 Sharp and cramping in nature Contractions off and on she reports

## 2018-03-10 NOTE — Progress Notes (Addendum)
G3P2 @ 34.[redacted] wksga. Here for vaginal pressure, abd pain like cramps, and diarrhea that started yesterday. Last episode was two hrs ago.   Denies LOF or bleeding. +FM   EFM applied   Provider at bs assessing  1844: D/c instructions given with pt understanding. Pt left unit via ambulatory with SO

## 2018-03-12 LAB — CULTURE, OB URINE: Culture: 50000 — AB

## 2018-03-13 ENCOUNTER — Encounter: Payer: Self-pay | Admitting: Family Medicine

## 2018-03-13 ENCOUNTER — Ambulatory Visit (INDEPENDENT_AMBULATORY_CARE_PROVIDER_SITE_OTHER): Payer: Medicaid Other | Admitting: Family Medicine

## 2018-03-13 VITALS — BP 103/64 | HR 98 | Wt 208.0 lb

## 2018-03-13 DIAGNOSIS — O09299 Supervision of pregnancy with other poor reproductive or obstetric history, unspecified trimester: Secondary | ICD-10-CM

## 2018-03-13 DIAGNOSIS — Z283 Underimmunization status: Secondary | ICD-10-CM

## 2018-03-13 DIAGNOSIS — O26893 Other specified pregnancy related conditions, third trimester: Secondary | ICD-10-CM

## 2018-03-13 DIAGNOSIS — O26899 Other specified pregnancy related conditions, unspecified trimester: Secondary | ICD-10-CM

## 2018-03-13 DIAGNOSIS — Z2839 Other underimmunization status: Secondary | ICD-10-CM

## 2018-03-13 DIAGNOSIS — Z6791 Unspecified blood type, Rh negative: Secondary | ICD-10-CM

## 2018-03-13 DIAGNOSIS — O09293 Supervision of pregnancy with other poor reproductive or obstetric history, third trimester: Secondary | ICD-10-CM

## 2018-03-13 DIAGNOSIS — Z3480 Encounter for supervision of other normal pregnancy, unspecified trimester: Secondary | ICD-10-CM

## 2018-03-13 DIAGNOSIS — O9989 Other specified diseases and conditions complicating pregnancy, childbirth and the puerperium: Secondary | ICD-10-CM

## 2018-03-13 DIAGNOSIS — O09893 Supervision of other high risk pregnancies, third trimester: Secondary | ICD-10-CM

## 2018-03-13 DIAGNOSIS — O09899 Supervision of other high risk pregnancies, unspecified trimester: Secondary | ICD-10-CM

## 2018-03-13 NOTE — Progress Notes (Signed)
   PRENATAL VISIT NOTE  Subjective:  Hannah Ball is a 27 y.o. G3P2002 at [redacted]w[redacted]d being seen today for ongoing prenatal care.  She is currently monitored for the following issues for this low-risk pregnancy and has Rh negative, antepartum; Supervision of other normal pregnancy, antepartum; Rubella non-immune status, antepartum; Short interval between pregnancies affecting pregnancy, antepartum; History of macrosomia in infant in prior pregnancy, currently pregnant; and Fetal bilateral renal pyelectasis on their problem list.  Patient reports backache.  Contractions: Irregular. Vag. Bleeding: None.  Movement: Present. Denies leaking of fluid.   The following portions of the patient's history were reviewed and updated as appropriate: allergies, current medications, past family history, past medical history, past social history, past surgical history and problem list. Problem list updated.  Objective:   Vitals:   03/13/18 1107  BP: 103/64  Pulse: 98  Weight: 208 lb (94.3 kg)    Fetal Status: Fetal Heart Rate (bpm): 144 Fundal Height: 34 cm Movement: Present     General:  Alert, oriented and cooperative. Patient is in no acute distress.  Skin: Skin is warm and dry. No rash noted.   Cardiovascular: Normal heart rate noted  Respiratory: Normal respiratory effort, no problems with respiration noted  Abdomen: Soft, gravid, appropriate for gestational age.  Pain/Pressure: Present     Pelvic: Cervical exam deferred        Extremities: Normal range of motion.     Mental Status: Normal mood and affect. Normal behavior. Normal judgment and thought content.   Assessment and Plan:  Pregnancy: G3P2002 at [redacted]w[redacted]d  1. History of macrosomia in infant in prior pregnancy, currently pregnant Patient is considering elective IOL at 39 weeks.  We discussed the ARRIVE Trial  2. Short interval between pregnancies affecting pregnancy, antepartum  3. Rubella non-immune status, antepartum  4. Rh  negative, antepartum Will need rhogam   5. Supervision of other normal pregnancy, antepartum UTD currently    Preterm labor symptoms and general obstetric precautions including but not limited to vaginal bleeding, contractions, leaking of fluid and fetal movement were reviewed in detail with the patient. Please refer to After Visit Summary for other counseling recommendations.  Return in about 2 weeks (around 03/27/2018) for Routine prenatal care.  No future appointments.  Federico Flake, MD

## 2018-03-20 IMAGING — US US MFM FETAL NUCHAL TRANSLUCENCY
2 series · 15 of 28 positions shown · non-contrast
Comparison: none

[Series 1: us mfm fetal nuchal translucency · 14 of 30 slices shown (1 of 2)]
[im 1/30]
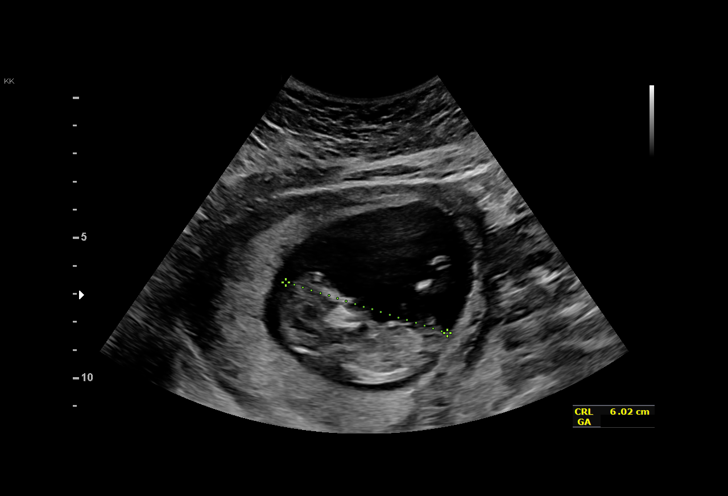
[im 3/30]
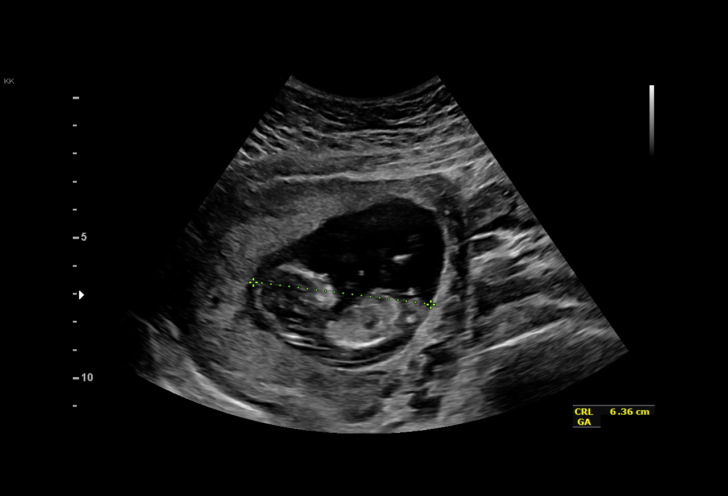
[im 5/30]
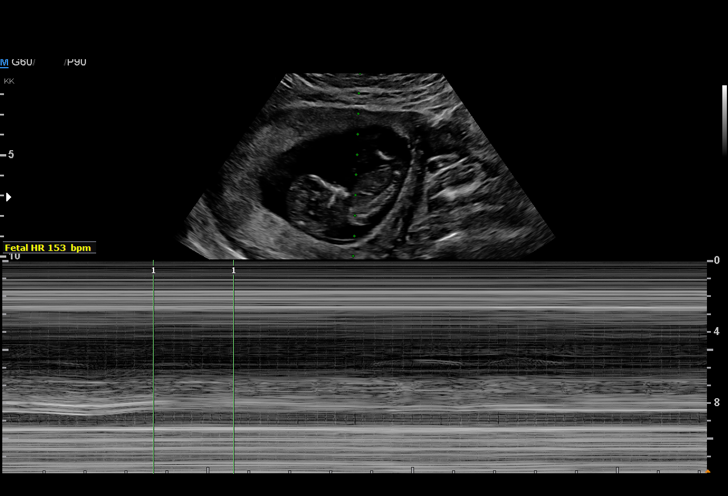
[im 7/30]
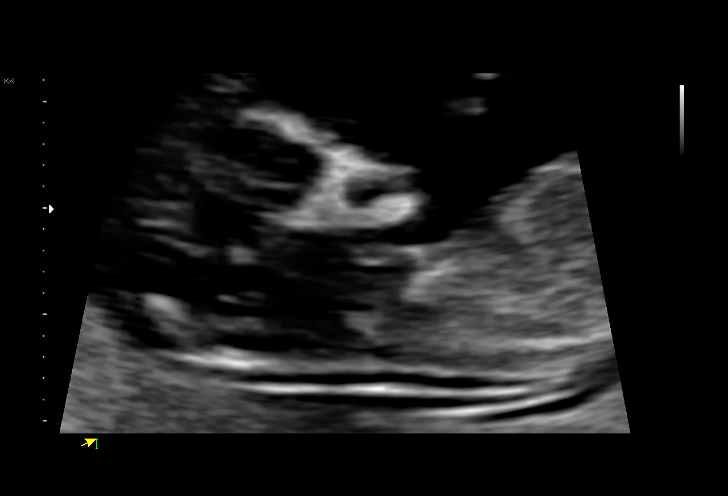
[im 9/30]
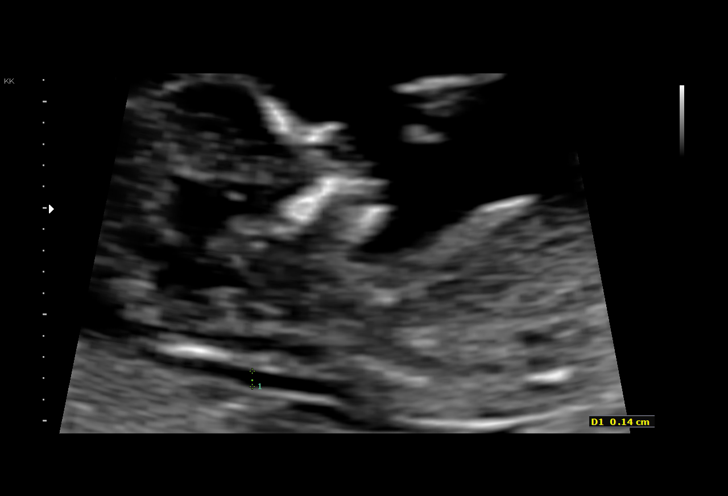
[im 12/30]
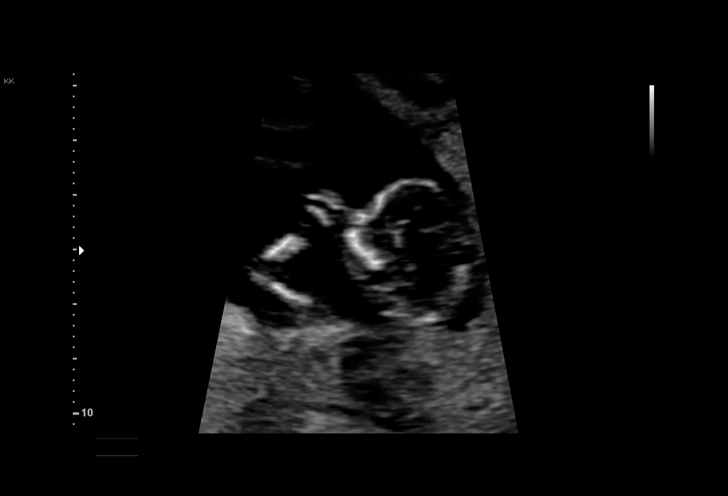
[im 14/30]
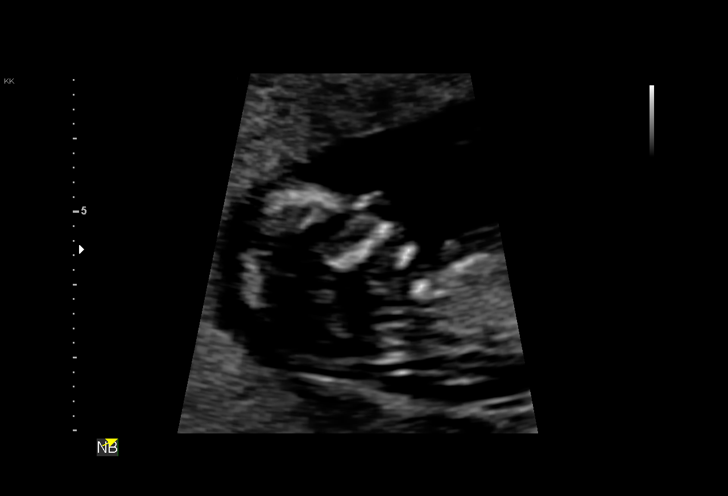
[im 16/30]
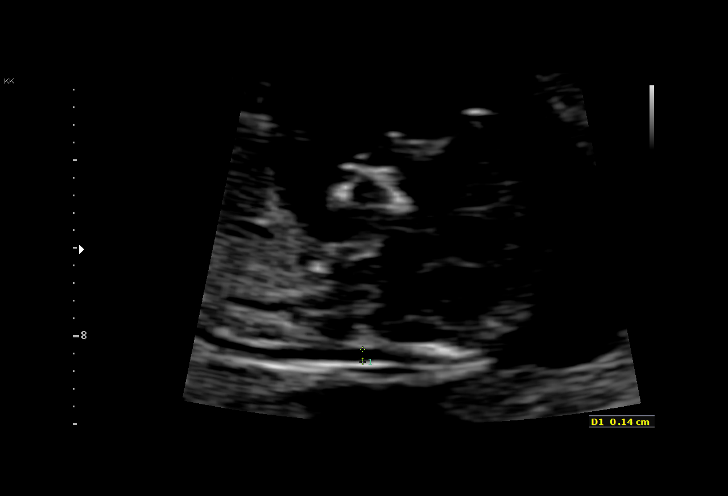
[im 17/30]
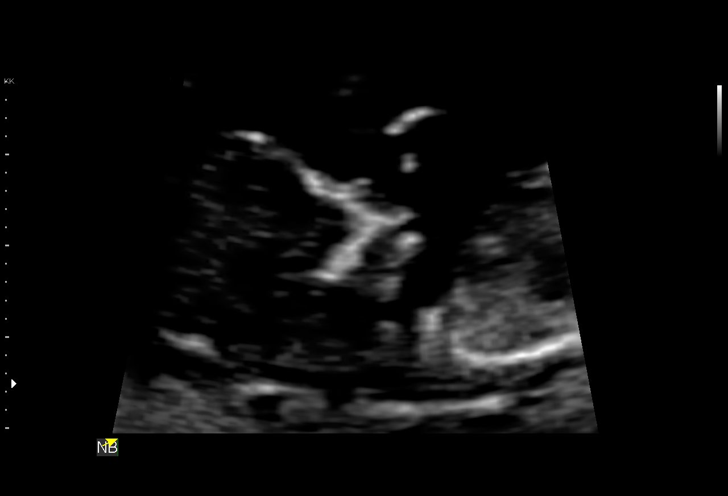
[im 19/30]
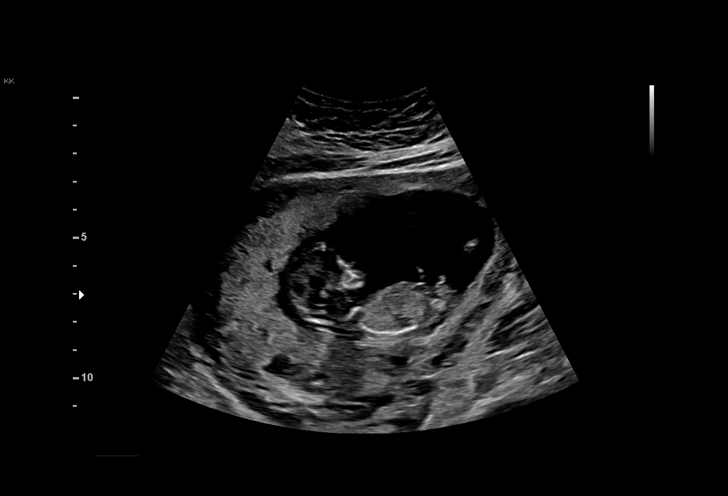
[im 22/30]
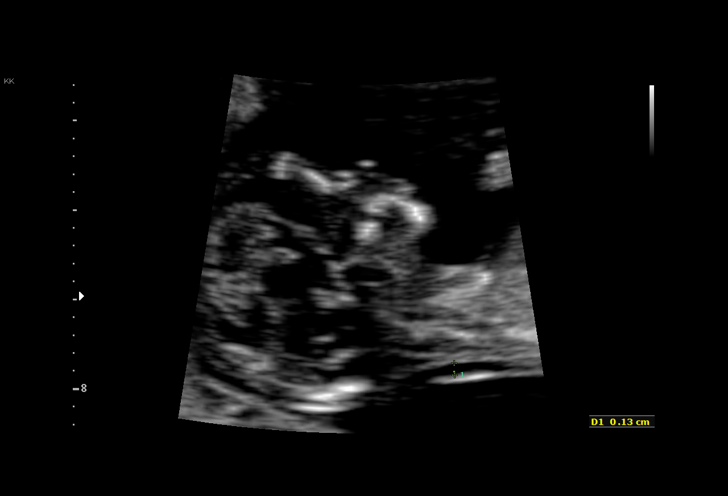
[im 24/30]
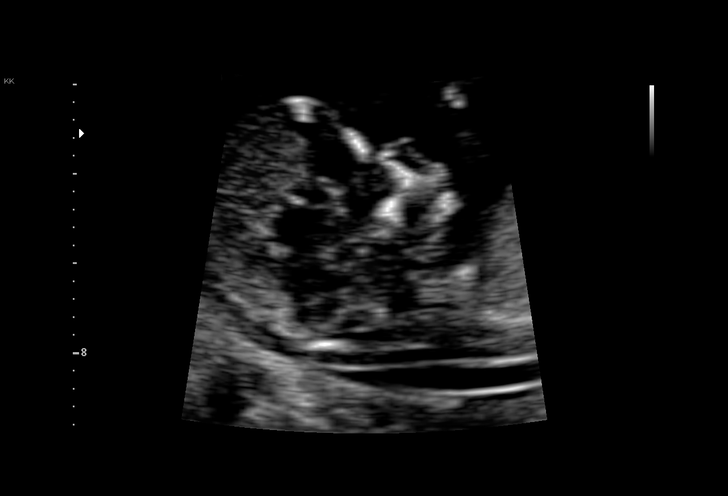
[im 26/30]
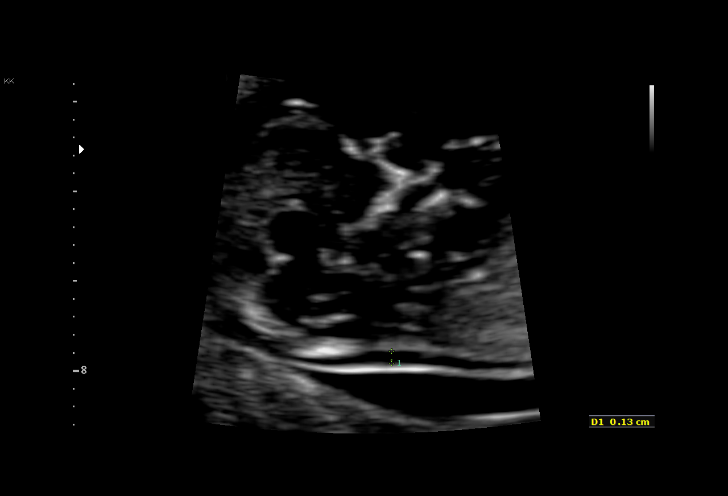
[im 28/30]
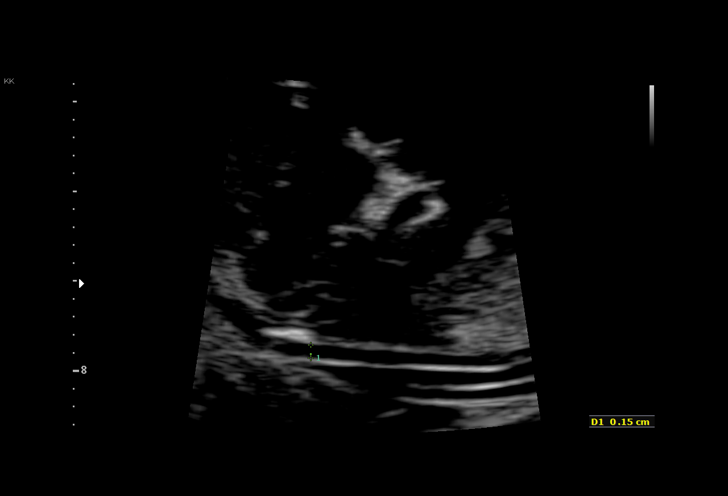

[Series 3: us mfm fetal nuchal translucency · 1 of 1 slices shown (2 of 2)]
[im 1/1]
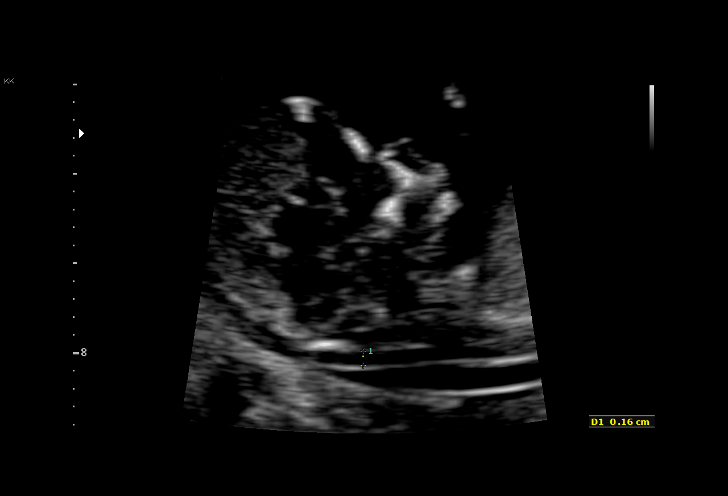

[15 of 28 positions shown; findings below may reference images not displayed]

TRANSLUCENCY

1  LEEBERT DELRIO           [id number]      [id number]     [id number]
Indications

13 weeks gestation of pregnancy
Encounter for nuchal translucency
OB History

Blood Type:            Height:  5'8"   Weight (lb):  186       BMI:
Gravidity:    2         Term:   1
Living:       1
Fetal Evaluation

Num Of Fetuses:     1
Fetal Heart         153
Rate(bpm):
Cardiac Activity:   Observed
Presentation:       Variable

Amniotic Fluid
AFI FV:      Subjectively within normal limits
Gestational Age

LMP:           13w 3d        Date:  07/30/16                 EDD:    05/06/17
Best:          13w 3d     Det. By:  LMP  (07/30/16)          EDD:    05/06/17
1st Trimester Genetic Sonogram Screening

CRL:            63.6  mm    G. Age:   12w 4d                 EDD:    05/12/17
Nuc Trans:       1.5  mm

Nasal Bone:                 Present
Impression

SIUP at 13+3 weeks
No gross abnormalities identified
NT measurement was within normal limits for this GA; NB
present
Normal amniotic fluid volume
Measurements consistent with LMP dating
Recommendations

Offer MSAFP in the second trimester for ONTD screening
Offer anatomy U/S by 18 weeks

## 2018-03-27 ENCOUNTER — Ambulatory Visit (INDEPENDENT_AMBULATORY_CARE_PROVIDER_SITE_OTHER): Payer: Medicaid Other | Admitting: Family Medicine

## 2018-03-27 ENCOUNTER — Other Ambulatory Visit (HOSPITAL_COMMUNITY)
Admission: RE | Admit: 2018-03-27 | Discharge: 2018-03-27 | Disposition: A | Discharge status: Home / Self Care | Payer: Medicaid Other | Source: Ambulatory Visit | Attending: Family Medicine | Admitting: Family Medicine

## 2018-03-27 ENCOUNTER — Encounter: Payer: Self-pay | Admitting: Family Medicine

## 2018-03-27 VITALS — BP 97/67 | HR 94 | Wt 212.0 lb

## 2018-03-27 DIAGNOSIS — O09899 Supervision of other high risk pregnancies, unspecified trimester: Secondary | ICD-10-CM

## 2018-03-27 DIAGNOSIS — O09299 Supervision of pregnancy with other poor reproductive or obstetric history, unspecified trimester: Secondary | ICD-10-CM

## 2018-03-27 DIAGNOSIS — Z283 Underimmunization status: Secondary | ICD-10-CM

## 2018-03-27 DIAGNOSIS — Z3483 Encounter for supervision of other normal pregnancy, third trimester: Secondary | ICD-10-CM | POA: Diagnosis present

## 2018-03-27 DIAGNOSIS — Z6791 Unspecified blood type, Rh negative: Secondary | ICD-10-CM

## 2018-03-27 DIAGNOSIS — Z3480 Encounter for supervision of other normal pregnancy, unspecified trimester: Secondary | ICD-10-CM

## 2018-03-27 DIAGNOSIS — Z2839 Other underimmunization status: Secondary | ICD-10-CM

## 2018-03-27 DIAGNOSIS — O9989 Other specified diseases and conditions complicating pregnancy, childbirth and the puerperium: Secondary | ICD-10-CM

## 2018-03-27 DIAGNOSIS — O26899 Other specified pregnancy related conditions, unspecified trimester: Secondary | ICD-10-CM

## 2018-03-27 NOTE — Progress Notes (Signed)
   PRENATAL VISIT NOTE  Subjective:  Hannah Ball is a 27 y.o. G3P2002 at 49w5dbeing seen today for ongoing prenatal care.  She is currently monitored for the following issues for this low-risk pregnancy and has Rh negative, antepartum; Supervision of other normal pregnancy, antepartum; Rubella non-immune status, antepartum; Short interval between pregnancies affecting pregnancy, antepartum; History of macrosomia in infant in prior pregnancy, currently pregnant; and Fetal bilateral renal pyelectasis on their problem list.  Patient reports no complaints.  Contractions: Irregular. Vag. Bleeding: None.  Movement: Present. Denies leaking of fluid.   The following portions of the patient's history were reviewed and updated as appropriate: allergies, current medications, past family history, past medical history, past social history, past surgical history and problem list. Problem list updated.  Objective:   Vitals:   03/27/18 1057  BP: 97/67  Pulse: 94  Weight: 212 lb (96.2 kg)    Fetal Status:   Fundal Height: 36 cm Movement: Present  Presentation: Vertex  General:  Alert, oriented and cooperative. Patient is in no acute distress.  Skin: Skin is warm and dry. No rash noted.   Cardiovascular: Normal heart rate noted  Respiratory: Normal respiratory effort, no problems with respiration noted  Abdomen: Soft, gravid, appropriate for gestational age.  Pain/Pressure: Present     Pelvic: Cervical exam deferred Dilation: Closed Effacement (%): 90 Station: -3  Extremities: Normal range of motion.     Mental Status: Normal mood and affect. Normal behavior. Normal judgment and thought content.   Assessment and Plan:  Pregnancy: G3P2002 at 359w5d1. Supervision of other normal pregnancy, antepartum - UTD - [facility]lam[facility], beta strep (group b only)  2. Rh negative, antepartum Received rhogam  3. Rubella non-immune status, antepartum Needs  MMR pp  4. History of macrosomia in infant in prior pregnancy, currently pregnant Consider 39 wk elective  5. Short interval between pregnancies affecting pregnancy, antepartum   Preterm labor symptoms and general obstetric precautions including but not limited to vaginal bleeding, contractions, leaking of fluid and fetal movement were reviewed in detail with the patient. Please refer to After Visit Summary for other counseling recommendations.  Return in about 1 week (around 04/03/2018) for Routine prenatal care.  No future appointments.  KiCaren MacadamMD

## 2018-03-28 LAB — GC/CHLAMYDIA PROBE AMP (~~LOC~~) NOT AT ARMC
Chlamydia: NEGATIVE
Neisseria Gonorrhea: NEGATIVE

## 2018-03-31 LAB — CULTURE, BETA STREP (GROUP B ONLY): STREP GP B CULTURE: NEGATIVE

## 2018-04-04 ENCOUNTER — Encounter: Payer: Self-pay | Admitting: Obstetrics and Gynecology

## 2018-04-04 ENCOUNTER — Ambulatory Visit (INDEPENDENT_AMBULATORY_CARE_PROVIDER_SITE_OTHER): Payer: Medicaid Other | Admitting: Obstetrics and Gynecology

## 2018-04-04 DIAGNOSIS — Z3480 Encounter for supervision of other normal pregnancy, unspecified trimester: Secondary | ICD-10-CM

## 2018-04-04 NOTE — Progress Notes (Signed)
Prenatal Visit Note Date: 04/04/2018 Clinic: [facility]  Subjective:  Hannah Ball is a 27 y.o. G3P2002 at 2162w6d being seen today for ongoing prenatal care.  She is currently monitored for the following issues for this low-risk pregnancy and has Rh negative, antepartum; Supervision of other normal pregnancy, antepartum; Rubella non-immune status, antepartum; Short interval between pregnancies affecting pregnancy, antepartum; and History of macrosomia in infant in prior pregnancy, currently pregnant on their problem list.  Patient reports no complaints.   Contractions: Irregular. Vag. Bleeding: None.  Movement: Present. Denies leaking of fluid.   The following portions of the patient's history were reviewed and updated as appropriate: allergies, current medications, past family history, past medical history, past social history, past surgical history and problem list. Problem list updated.  Objective:   Vitals:   04/04/18 1007  BP: 119/70  Pulse: (!) 109  Weight: 209 lb (94.8 kg)    Fetal Status: Fetal Heart Rate (bpm): 140 Fundal Height: 36 cm Movement: Present  Presentation: Vertex  General:  Alert, oriented and cooperative. Patient is in no acute distress.  Skin: Skin is warm and dry. No rash noted.   Cardiovascular: Normal heart rate noted  Respiratory: Normal respiratory effort, no problems with respiration noted  Abdomen: Soft, gravid, appropriate for gestational age. Pain/Pressure: Present     Pelvic:  Cervical exam performed Dilation: 1.5 Effacement (%): 50 Station: Ballotable  Extremities: Normal range of motion.     Mental Status: Normal mood and affect. Normal behavior. Normal judgment and thought content.   Urinalysis:      Assessment and Plan:  Pregnancy: G3P2002 at 3262w6d  1. Supervision of other normal pregnancy, antepartum Routine care. OCPs  Term labor symptoms and general obstetric precautions including but not limited to vaginal  bleeding, contractions, leaking of fluid and fetal movement were reviewed in detail with the patient. Please refer to After Visit Summary for other counseling recommendations.  Return in about 6 days (around 04/10/2018).   [place] Bing[place]ens, Katyana Trolinger, MD

## 2018-04-07 ENCOUNTER — Other Ambulatory Visit: Payer: Self-pay

## 2018-04-07 ENCOUNTER — Encounter (HOSPITAL_COMMUNITY): Payer: Self-pay

## 2018-04-07 ENCOUNTER — Inpatient Hospital Stay (HOSPITAL_COMMUNITY)
Admission: AD | Admit: 2018-04-07 | Discharge: 2018-04-07 | Disposition: A | Discharge status: Home / Self Care | Payer: Medicaid Other | Source: Ambulatory Visit | Attending: Obstetrics & Gynecology | Admitting: Obstetrics & Gynecology

## 2018-04-07 DIAGNOSIS — O26893 Other specified pregnancy related conditions, third trimester: Secondary | ICD-10-CM | POA: Insufficient documentation

## 2018-04-07 DIAGNOSIS — Z0371 Encounter for suspected problem with amniotic cavity and membrane ruled out: Secondary | ICD-10-CM

## 2018-04-07 DIAGNOSIS — Z3A38 38 weeks gestation of pregnancy: Secondary | ICD-10-CM | POA: Diagnosis not present

## 2018-04-07 LAB — POCT FERN TEST: POCT FERN TEST: NEGATIVE

## 2018-04-07 NOTE — Discharge Instructions (Signed)
Braxton Hicks Contractions °Contractions of the uterus can occur throughout pregnancy, but they are not always a sign that you are in labor. You may have practice contractions called Braxton Hicks contractions. These false labor contractions are sometimes confused with true labor. °What are Braxton Hicks contractions? °Braxton Hicks contractions are tightening movements that occur in the muscles of the uterus before labor. Unlike true labor contractions, these contractions do not result in opening (dilation) and thinning of the cervix. Toward the end of pregnancy (32-34 weeks), Braxton Hicks contractions can happen more often and may become stronger. These contractions are sometimes difficult to tell apart from true labor because they can be very uncomfortable. You should not feel embarrassed if you go to the hospital with false labor. °Sometimes, the only way to tell if you are in true labor is for your health care provider to look for changes in the cervix. The health care provider will do a physical exam and may monitor your contractions. If you are not in true labor, the exam should show that your cervix is not dilating and your water has not broken. °If there are other health problems associated with your pregnancy, it is completely safe for you to be sent home with false labor. You may continue to have Braxton Hicks contractions until you go into true labor. °How to tell the difference between true labor and false labor °True labor °· Contractions last 30-70 seconds. °· Contractions become very regular. °· Discomfort is usually felt in the top of the uterus, and it spreads to the lower abdomen and low back. °· Contractions do not go away with walking. °· Contractions usually become more intense and increase in frequency. °· The cervix dilates and gets thinner. °False labor °· Contractions are usually shorter and not as strong as true labor contractions. °· Contractions are usually irregular. °· Contractions  are often felt in the front of the lower abdomen and in the groin. °· Contractions may go away when you walk around or change positions while lying down. °· Contractions get weaker and are shorter-lasting as time goes on. °· The cervix usually does not dilate or become thin. °Follow these instructions at home: °· Take over-the-counter and prescription medicines only as told by your health care provider. °· Keep up with your usual exercises and follow other instructions from your health care provider. °· Eat and drink lightly if you think you are going into labor. °· If Braxton Hicks contractions are making you uncomfortable: °? Change your position from lying down or resting to walking, or change from walking to resting. °? Sit and rest in a tub of warm water. °? Drink enough fluid to keep your urine pale yellow. Dehydration may cause these contractions. °? Do slow and deep breathing several times an hour. °· Keep all follow-up prenatal visits as told by your health care provider. This is important. °Contact a health care provider if: °· You have a fever. °· You have continuous pain in your abdomen. °Get help right away if: °· Your contractions become stronger, more regular, and closer together. °· You have fluid leaking or gushing from your vagina. °· You pass blood-tinged mucus (bloody show). °· You have bleeding from your vagina. °· You have low back pain that you never had before. °· You feel your baby’s head pushing down and causing pelvic pressure. °· Your baby is not moving inside you as much as it used to. °Summary °· Contractions that occur before labor are called Braxton   Hicks contractions, false labor, or practice contractions. °· Braxton Hicks contractions are usually shorter, weaker, farther apart, and less regular than true labor contractions. True labor contractions usually become progressively stronger and regular and they become more frequent. °· Manage discomfort from Braxton Hicks contractions by  changing position, resting in a warm bath, drinking plenty of water, or practicing deep breathing. °This information is not intended to replace advice given to you by your health care provider. Make sure you discuss any questions you have with your health care provider. °Document Released: 03/01/2017 Document Revised: 03/01/2017 Document Reviewed: 03/01/2017 °Elsevier Interactive Patient Education © 2018 [employer] ° °

## 2018-04-07 NOTE — MAU Provider Note (Signed)
First Provider Initiated Contact with Patient 04/07/18 1820       S: [name] is a 27 y.o. G3P2002 at 5781w2d  who presents to MAU today complaining of leaking of fluid since noon today. She denies vaginal bleeding. She denies contractions. She reports normal fetal movement.    O: BP 129/65 (BP Location: Right Arm)   Pulse 91   Temp (!) 97.5 F (36.4 C) (Oral)   Resp 18   Wt 216 lb 1.3 oz (98 kg)   LMP 06/30/2017   SpO2 100% Comment: ra  BMI 32.85 kg/m  GENERAL: Well-developed, well-nourished female in no acute distress.  HEAD: Normocephalic, atraumatic.  CHEST: Normal effort of breathing, regular heart rate ABDOMEN: Soft, nontender, gravid PELVIC: Normal external female genitalia. Vagina is pink and rugated. Cervix with normal contour, no lesions. Normal discharge.  Negative pooling.   Cervical exam:  Dilation: 4 Effacement (%): 70, 80 Station: -3 Presentation: Vertex Exam by:: Marvel PlanJessica Hashem RN    Fetal Monitoring: Baseline: 125 bpm Variability: moderate Accelerations: 15x15 Decelerations: none Contractions: None  Results for orders placed or performed during the hospital encounter of 04/07/18 (from the past 24 hour(s))  POCT fern test     Status: None   Collection Time: 04/07/18  7:14 PM  Result Value Ref Range   POCT Fern Test Negative = intact amniotic membranes      A: SIUP at 2481w2d  Membranes intact  P: DC home, labor precautions, return to MAU if symptoms worsen   Venia Carbonasch, Jennifer I, NP 04/07/2018 6:21 PM

## 2018-04-07 NOTE — MAU Note (Signed)
Patient c/o  +contractions Irregular Rating pain 9/10 when they happen Started a couple of days ago  +?LOF Clear  has been leaking off and on for a few days and has been told it is just urine. States today after using the bathroom she sat on the toilet and more fluid continued to come out  Denies VB +FM  States was 1.5cm on Thursday  Denies complications with this pregnancy

## 2018-04-08 ENCOUNTER — Ambulatory Visit (INDEPENDENT_AMBULATORY_CARE_PROVIDER_SITE_OTHER): Payer: Medicaid Other | Admitting: *Deleted

## 2018-04-08 ENCOUNTER — Telehealth (HOSPITAL_COMMUNITY): Payer: Self-pay | Admitting: *Deleted

## 2018-04-08 ENCOUNTER — Encounter (HOSPITAL_COMMUNITY): Payer: Self-pay | Admitting: *Deleted

## 2018-04-08 ENCOUNTER — Ambulatory Visit: Payer: Medicaid Other

## 2018-04-08 VITALS — BP 113/65 | HR 106 | Wt 209.0 lb

## 2018-04-08 DIAGNOSIS — O471 False labor at or after 37 completed weeks of gestation: Secondary | ICD-10-CM

## 2018-04-08 DIAGNOSIS — Z3A38 38 weeks gestation of pregnancy: Secondary | ICD-10-CM

## 2018-04-08 DIAGNOSIS — Z3480 Encounter for supervision of other normal pregnancy, unspecified trimester: Secondary | ICD-10-CM

## 2018-04-08 NOTE — Progress Notes (Signed)
11:45pm 6/14 - Induction Pt here for NST to r/o labor.

## 2018-04-08 NOTE — Progress Notes (Signed)
NST:  Baseline: 130 bpm, Variability: Good {> 6 bpm), Accelerations: Reactive and Decelerations: Absent   Patient seen and assessed by nursing staff.  Agree with documentation and plan.    

## 2018-04-08 NOTE — Telephone Encounter (Signed)
Preadmission screen  

## 2018-04-10 ENCOUNTER — Ambulatory Visit (INDEPENDENT_AMBULATORY_CARE_PROVIDER_SITE_OTHER): Payer: Medicaid Other | Admitting: Family Medicine

## 2018-04-10 ENCOUNTER — Encounter: Payer: Self-pay | Admitting: Family Medicine

## 2018-04-10 VITALS — BP 105/70 | HR 100 | Wt 211.0 lb

## 2018-04-10 DIAGNOSIS — O09293 Supervision of pregnancy with other poor reproductive or obstetric history, third trimester: Secondary | ICD-10-CM

## 2018-04-10 DIAGNOSIS — Z3483 Encounter for supervision of other normal pregnancy, third trimester: Secondary | ICD-10-CM

## 2018-04-10 DIAGNOSIS — Z6791 Unspecified blood type, Rh negative: Secondary | ICD-10-CM

## 2018-04-10 DIAGNOSIS — Z283 Underimmunization status: Secondary | ICD-10-CM

## 2018-04-10 DIAGNOSIS — O09893 Supervision of other high risk pregnancies, third trimester: Secondary | ICD-10-CM

## 2018-04-10 DIAGNOSIS — O26893 Other specified pregnancy related conditions, third trimester: Secondary | ICD-10-CM

## 2018-04-10 DIAGNOSIS — Z2839 Other underimmunization status: Secondary | ICD-10-CM

## 2018-04-10 DIAGNOSIS — O26899 Other specified pregnancy related conditions, unspecified trimester: Secondary | ICD-10-CM

## 2018-04-10 DIAGNOSIS — O09899 Supervision of other high risk pregnancies, unspecified trimester: Secondary | ICD-10-CM

## 2018-04-10 DIAGNOSIS — O09299 Supervision of pregnancy with other poor reproductive or obstetric history, unspecified trimester: Secondary | ICD-10-CM

## 2018-04-10 DIAGNOSIS — Z3480 Encounter for supervision of other normal pregnancy, unspecified trimester: Secondary | ICD-10-CM

## 2018-04-10 DIAGNOSIS — O9989 Other specified diseases and conditions complicating pregnancy, childbirth and the puerperium: Secondary | ICD-10-CM

## 2018-04-10 NOTE — Progress Notes (Signed)
   PRENATAL VISIT NOTE  Subjective:  Hannah Ball is a 27 y.o. G3P2002 at 24w5dbeing seen today for ongoing prenatal care.  She is currently monitored for the following issues for this low-risk pregnancy and has Rh negative, antepartum; Supervision of other normal pregnancy, antepartum; Rubella non-immune status, antepartum; Short interval between pregnancies affecting pregnancy, antepartum; and History of macrosomia in infant in prior pregnancy, currently pregnant on their problem list.  Patient reports no complaints.  Contractions: Irregular. Vag. Bleeding: None.  Movement: Present. Denies leaking of fluid.   The following portions of the patient's history were reviewed and updated as appropriate: allergies, current medications, past family history, past medical history, past social history, past surgical history and problem list. Problem list updated.  Objective:   Vitals:   04/10/18 1011  BP: 105/70  Pulse: 100  Weight: 211 lb (95.7 kg)    Fetal Status: Fetal Heart Rate (bpm): 145 Fundal Height: 38 cm Movement: Present  Presentation: Vertex  General:  Alert, oriented and cooperative. Patient is in no acute distress.  Skin: Skin is warm and dry. No rash noted.   Cardiovascular: Normal heart rate noted  Respiratory: Normal respiratory effort, no problems with respiration noted  Abdomen: Soft, gravid, appropriate for gestational age.  Pain/Pressure: Present     Pelvic: Cervical exam performed Dilation: 2 Effacement (%): 50 Station: -3  Extremities: Normal range of motion.     Mental Status: Normal mood and affect. Normal behavior. Normal judgment and thought content.   Assessment and Plan:  Pregnancy: G3P2002 at 271w5d1. Rh negative, antepartum Received rhogam, needs pp   2. Supervision of other normal pregnancy, antepartum UTD Currently Has IOL scheduled on 6/15 at midnight  3. Short interval between pregnancies affecting pregnancy, antepartum  4. Rubella  non-immune status, antepartum Needs pp MMR before discharge from hospital  5. History of macrosomia in infant in prior pregnancy, currently pregnant Last child was 10#12oz  Term labor symptoms and general obstetric precautions including but not limited to vaginal bleeding, contractions, leaking of fluid and fetal movement were reviewed in detail with the patient. Please refer to After Visit Summary for other counseling recommendations.  Return in about 6 weeks (around 05/22/2018) for Routine prenatal care.  Future Appointments  Date Time Provider DeGridley6/15/2019 12:00 AM [facility] ROOM [facility] None  05/22/2018  1:00 PM DaLaury DeepCNM [facility]    KiCaren MacadamMD

## 2018-04-10 NOTE — Patient Instructions (Signed)

## 2018-04-13 ENCOUNTER — Inpatient Hospital Stay (HOSPITAL_COMMUNITY): Payer: Medicaid Other | Admitting: Anesthesiology

## 2018-04-13 ENCOUNTER — Encounter (HOSPITAL_COMMUNITY): Payer: Self-pay

## 2018-04-13 ENCOUNTER — Inpatient Hospital Stay (HOSPITAL_COMMUNITY)
Admission: RE | Admit: 2018-04-13 | Discharge: 2018-04-14 | DRG: 807 | Disposition: A | Discharge status: Home / Self Care | Payer: Medicaid Other | Attending: Obstetrics and Gynecology | Admitting: Obstetrics and Gynecology

## 2018-04-13 DIAGNOSIS — Z880 Allergy status to penicillin: Secondary | ICD-10-CM | POA: Diagnosis not present

## 2018-04-13 DIAGNOSIS — Z3A39 39 weeks gestation of pregnancy: Secondary | ICD-10-CM

## 2018-04-13 DIAGNOSIS — Z6791 Unspecified blood type, Rh negative: Secondary | ICD-10-CM

## 2018-04-13 DIAGNOSIS — Z283 Underimmunization status: Secondary | ICD-10-CM

## 2018-04-13 DIAGNOSIS — O26893 Other specified pregnancy related conditions, third trimester: Secondary | ICD-10-CM | POA: Diagnosis present

## 2018-04-13 DIAGNOSIS — O09299 Supervision of pregnancy with other poor reproductive or obstetric history, unspecified trimester: Secondary | ICD-10-CM

## 2018-04-13 DIAGNOSIS — Z3480 Encounter for supervision of other normal pregnancy, unspecified trimester: Secondary | ICD-10-CM

## 2018-04-13 DIAGNOSIS — Z2839 Other underimmunization status: Secondary | ICD-10-CM

## 2018-04-13 DIAGNOSIS — Z3483 Encounter for supervision of other normal pregnancy, third trimester: Secondary | ICD-10-CM | POA: Diagnosis present

## 2018-04-13 DIAGNOSIS — O9989 Other specified diseases and conditions complicating pregnancy, childbirth and the puerperium: Secondary | ICD-10-CM

## 2018-04-13 DIAGNOSIS — O09899 Supervision of other high risk pregnancies, unspecified trimester: Secondary | ICD-10-CM

## 2018-04-13 LAB — CBC
HEMATOCRIT: 34.6 % — AB (ref 36.0–46.0)
HEMOGLOBIN: 10.8 g/dL — AB (ref 12.0–15.0)
MCH: 22.6 pg — AB (ref 26.0–34.0)
MCHC: 31.2 g/dL (ref 30.0–36.0)
MCV: 72.4 fL — AB (ref 78.0–100.0)
Platelets: 181 10*3/uL (ref 150–400)
RBC: 4.78 MIL/uL (ref 3.87–5.11)
RDW: 16.3 % — ABNORMAL HIGH (ref 11.5–15.5)
WBC: 8.7 10*3/uL (ref 4.0–10.5)

## 2018-04-13 LAB — RPR: RPR Ser Ql: NONREACTIVE

## 2018-04-13 LAB — TYPE AND SCREEN
ABO/RH(D): A NEG
Antibody Screen: NEGATIVE

## 2018-04-13 MED ORDER — TERBUTALINE SULFATE 1 MG/ML IJ SOLN
0.2500 mg | Freq: Once | INTRAMUSCULAR | Status: DC | PRN
  Filled 2018-04-13: qty 1

## 2018-04-13 MED ORDER — COCONUT OIL OIL
1.0000 "application " | TOPICAL_OIL | Status: DC | PRN
  Filled 2018-04-13: qty 120

## 2018-04-13 MED ORDER — FENTANYL CITRATE (PF) 100 MCG/2ML IJ SOLN
100.0000 ug | INTRAMUSCULAR | Status: DC | PRN
  Administered 2018-04-13: 100 ug via INTRAVENOUS
  Administered 2018-04-13: 50 ug via INTRAVENOUS
  Filled 2018-04-13 (×2): qty 2

## 2018-04-13 MED ORDER — IBUPROFEN 600 MG PO TABS
600.0000 mg | ORAL_TABLET | Freq: Four times a day (QID) | ORAL | Status: DC
  Administered 2018-04-13 – 2018-04-14 (×6): 600 mg via ORAL
  Filled 2018-04-13 (×6): qty 1

## 2018-04-13 MED ORDER — OXYCODONE-ACETAMINOPHEN 5-325 MG PO TABS: 2.0000 | ORAL_TABLET | ORAL | Status: DC | PRN

## 2018-04-13 MED ORDER — OXYCODONE-ACETAMINOPHEN 5-325 MG PO TABS: 1.0000 | ORAL_TABLET | ORAL | Status: DC | PRN

## 2018-04-13 MED ORDER — LIDOCAINE HCL (PF) 1 % IJ SOLN
INTRAMUSCULAR | Status: DC | PRN
  Administered 2018-04-13 (×2): 5 mL via EPIDURAL

## 2018-04-13 MED ORDER — ONDANSETRON HCL 4 MG/2ML IJ SOLN: 4.0000 mg | INTRAMUSCULAR | Status: DC | PRN

## 2018-04-13 MED ORDER — PRENATAL MULTIVITAMIN CH
1.0000 | ORAL_TABLET | Freq: Every day | ORAL | Status: DC
  Administered 2018-04-13 – 2018-04-14 (×2): 1 via ORAL
  Filled 2018-04-13 (×2): qty 1

## 2018-04-13 MED ORDER — BENZOCAINE-MENTHOL 20-0.5 % EX AERO
1.0000 "application " | INHALATION_SPRAY | CUTANEOUS | Status: DC | PRN
  Filled 2018-04-13: qty 56

## 2018-04-13 MED ORDER — LACTATED RINGERS IV SOLN
INTRAVENOUS | Status: DC
  Administered 2018-04-13: 01:00:00 via INTRAVENOUS

## 2018-04-13 MED ORDER — OXYTOCIN 40 UNITS IN LACTATED RINGERS INFUSION - SIMPLE MED
1.0000 m[IU]/min | INTRAVENOUS | Status: DC
  Administered 2018-04-13: 2 m[IU]/min via INTRAVENOUS
  Filled 2018-04-13: qty 1000

## 2018-04-13 MED ORDER — FENTANYL 2.5 MCG/ML BUPIVACAINE 1/10 % EPIDURAL INFUSION (WH - ANES)
INTRAMUSCULAR | Status: AC
  Filled 2018-04-13: qty 100

## 2018-04-13 MED ORDER — PHENYLEPHRINE 40 MCG/ML (10ML) SYRINGE FOR IV PUSH (FOR BLOOD PRESSURE SUPPORT)
80.0000 ug | PREFILLED_SYRINGE | INTRAVENOUS | Status: DC | PRN
  Filled 2018-04-13: qty 5

## 2018-04-13 MED ORDER — ONDANSETRON HCL 4 MG PO TABS: 4.0000 mg | ORAL_TABLET | ORAL | Status: DC | PRN

## 2018-04-13 MED ORDER — ZOLPIDEM TARTRATE 5 MG PO TABS: 5.0000 mg | ORAL_TABLET | Freq: Every evening | ORAL | Status: DC | PRN

## 2018-04-13 MED ORDER — DIPHENHYDRAMINE HCL 50 MG/ML IJ SOLN: 12.5000 mg | INTRAMUSCULAR | Status: DC | PRN

## 2018-04-13 MED ORDER — ACETAMINOPHEN 325 MG PO TABS
650.0000 mg | ORAL_TABLET | ORAL | Status: DC | PRN
  Administered 2018-04-13 – 2018-04-14 (×2): 650 mg via ORAL
  Filled 2018-04-13 (×2): qty 2

## 2018-04-13 MED ORDER — SOD CITRATE-CITRIC ACID 500-334 MG/5ML PO SOLN: 30.0000 mL | ORAL | Status: DC | PRN

## 2018-04-13 MED ORDER — EPHEDRINE 5 MG/ML INJ: 10.0000 mg | INTRAVENOUS | Status: DC | PRN

## 2018-04-13 MED ORDER — LACTATED RINGERS IV SOLN: 500.0000 mL | Freq: Once | INTRAVENOUS | Status: DC

## 2018-04-13 MED ORDER — LACTATED RINGERS IV SOLN: 500.0000 mL | INTRAVENOUS | Status: DC | PRN

## 2018-04-13 MED ORDER — ONDANSETRON HCL 4 MG/2ML IJ SOLN: 4.0000 mg | Freq: Four times a day (QID) | INTRAMUSCULAR | Status: DC | PRN

## 2018-04-13 MED ORDER — PHENYLEPHRINE 40 MCG/ML (10ML) SYRINGE FOR IV PUSH (FOR BLOOD PRESSURE SUPPORT)
PREFILLED_SYRINGE | INTRAVENOUS | Status: AC
  Filled 2018-04-13: qty 10

## 2018-04-13 MED ORDER — DIBUCAINE 1 % RE OINT
1.0000 "application " | TOPICAL_OINTMENT | RECTAL | Status: DC | PRN
  Filled 2018-04-13: qty 28

## 2018-04-13 MED ORDER — PHENYLEPHRINE 40 MCG/ML (10ML) SYRINGE FOR IV PUSH (FOR BLOOD PRESSURE SUPPORT): 80.0000 ug | PREFILLED_SYRINGE | INTRAVENOUS | Status: DC | PRN

## 2018-04-13 MED ORDER — OXYTOCIN BOLUS FROM INFUSION
500.0000 mL | Freq: Once | INTRAVENOUS | Status: AC
  Administered 2018-04-13: 500 mL via INTRAVENOUS

## 2018-04-13 MED ORDER — LIDOCAINE HCL (PF) 1 % IJ SOLN
30.0000 mL | INTRAMUSCULAR | Status: DC | PRN
  Filled 2018-04-13: qty 30

## 2018-04-13 MED ORDER — LACTATED RINGERS IV SOLN
500.0000 mL | Freq: Once | INTRAVENOUS | Status: AC
  Administered 2018-04-13: 500 mL via INTRAVENOUS

## 2018-04-13 MED ORDER — SIMETHICONE 80 MG PO CHEW: 80.0000 mg | CHEWABLE_TABLET | ORAL | Status: DC | PRN

## 2018-04-13 MED ORDER — TETANUS-DIPHTH-ACELL PERTUSSIS 5-2.5-18.5 LF-MCG/0.5 IM SUSP: 0.5000 mL | Freq: Once | INTRAMUSCULAR | Status: DC

## 2018-04-13 MED ORDER — EPHEDRINE 5 MG/ML INJ
10.0000 mg | INTRAVENOUS | Status: DC | PRN
Start: 2018-04-13 — End: 2018-04-13
  Filled 2018-04-13: qty 2

## 2018-04-13 MED ORDER — MISOPROSTOL 25 MCG QUARTER TABLET
25.0000 ug | ORAL_TABLET | ORAL | Status: DC | PRN
  Filled 2018-04-13: qty 1

## 2018-04-13 MED ORDER — OXYTOCIN 40 UNITS IN LACTATED RINGERS INFUSION - SIMPLE MED: 2.5000 [IU]/h | INTRAVENOUS | Status: DC

## 2018-04-13 MED ORDER — ACETAMINOPHEN 325 MG PO TABS: 650.0000 mg | ORAL_TABLET | ORAL | Status: DC | PRN

## 2018-04-13 MED ORDER — EPHEDRINE 5 MG/ML INJ
10.0000 mg | INTRAVENOUS | Status: DC | PRN
  Filled 2018-04-13: qty 2

## 2018-04-13 MED ORDER — WITCH HAZEL-GLYCERIN EX PADS
1.0000 "application " | MEDICATED_PAD | CUTANEOUS | Status: DC | PRN
  Administered 2018-04-13: 1 via TOPICAL

## 2018-04-13 MED ORDER — FENTANYL 2.5 MCG/ML BUPIVACAINE 1/10 % EPIDURAL INFUSION (WH - ANES)
14.0000 mL/h | INTRAMUSCULAR | Status: DC | PRN
  Administered 2018-04-13: 14 mL/h via EPIDURAL

## 2018-04-13 MED ORDER — SENNOSIDES-DOCUSATE SODIUM 8.6-50 MG PO TABS
2.0000 | ORAL_TABLET | ORAL | Status: DC
  Administered 2018-04-13: 2 via ORAL
  Filled 2018-04-13: qty 2

## 2018-04-13 NOTE — Anesthesia Preprocedure Evaluation (Signed)
Anesthesia Evaluation  Patient identified by MRN, date of birth, ID band Patient awake    Reviewed: Allergy & Precautions, NPO status , Patient's Chart, lab work & pertinent test results  History of Anesthesia Complications Negative for: history of anesthetic complications  Airway Mallampati: II  TM Distance: >3 FB Neck ROM: Full    Dental  (+) Dental Advisory Given   Pulmonary neg pulmonary ROS,    breath sounds clear to auscultation       Cardiovascular negative cardio ROS   Rhythm:Regular Rate:Normal     Neuro/Psych negative neurological ROS     GI/Hepatic negative GI ROS, Neg liver ROS,   Endo/Other  negative endocrine ROS  Renal/GU negative Renal ROS     Musculoskeletal   Abdominal (+) + obese,   Peds  Hematology plt 181k   Anesthesia Other Findings   Reproductive/Obstetrics (+) Pregnancy                             Anesthesia Physical Anesthesia Plan  ASA: II  Anesthesia Plan: Epidural   Post-op Pain Management:    Induction:   PONV Risk Score and Plan: 2 and Treatment may vary due to age or medical condition  Airway Management Planned: Natural Airway  Additional Equipment:   Intra-op Plan:   Post-operative Plan:   Informed Consent: I have reviewed the patients History and Physical, chart, labs and discussed the procedure including the risks, benefits and alternatives for the proposed anesthesia with the patient or authorized representative who has indicated his/her understanding and acceptance.   Dental advisory given  Plan Discussed with:   Anesthesia Plan Comments: (Patient identified. Risks/Benefits/Options discussed with patient including but not limited to bleeding, infection, nerve damage, paralysis, failed block, incomplete pain control, headache, blood pressure changes, nausea, vomiting, reactions to medication both or allergic, itching and postpartum back  pain. Confirmed with bedside nurse the patient's most recent platelet count. Confirmed with patient that they are not currently taking any anticoagulation, have any bleeding history or any family history of bleeding disorders. Patient expressed understanding and wished to proceed. All questions were answered. )        Anesthesia Quick Evaluation

## 2018-04-13 NOTE — Lactation Note (Signed)
This note was copied from a baby's chart. Lactation Consultation Note  Patient Name: Girl Corky DownsJessica Young-Garcia ZOXWR'UToday's Date: 04/13/2018 Reason for consult: Initial assessment;Term  7 hours old FT female who is being exclusively BF by her mother, she's a P3 and experienced BF, she was able to BF her other kids for 2-3 months. Mom was getting ready to put baby to the breast when entering the room, offered assistance with latch, when LC was getting ready to take baby to the breast, noticed that she needed a diaper change; she just kept crying. Asked dad to assist while LC reviewed some BF information with mom. After baby was dry and clean, LC took baby STS to mom's left breast but baby would not latch, she was still too upset crying, LC tried to calm her down without success, mom just kept baby STS for the rest of the consult. Mom voiced baby did the same the last time (lots of crying prior latching) but that she eventually latched.  After a few minutes, mom was able to latched her to the left breast, this time in cross cradle position (unlike the classic cradle she tried earlier) and baby was able to sustain the latch at least until [facility] of [facility] room, only a couple of swallows were heard but per mom she's been able to hear baby swallowing in prior feedings. Both nipples still looking intact upon examination with no signs of trauma. Baby still nursing when exiting the room.  Encouraged mom to keep feeding baby STS 8-12 times/24 hours or sooner if feeding cues are present. FOB present but not very supportive, he kept playing video games on his phone the entire time of consult, except when he was asked to change baby's diaper. Reviewed BF brochure, BF resources and feeding diary, both parents are aware of LC services and will call PRN.  Maternal Data Formula Feeding for Exclusion: No Has patient been taught Hand Expression?: Yes Does the patient have breastfeeding experience prior to this delivery?:  Yes  Feeding Feeding Type: Breast Fed Length of feed: 5 min(baby still nursing when exiting the room)  LATCH Score Latch: Repeated attempts needed to sustain latch, nipple held in mouth throughout feeding, stimulation needed to elicit sucking reflex.  Audible Swallowing: A few with stimulation  Type of Nipple: Everted at rest and after stimulation  Comfort (Breast/Nipple): Soft / non-tender  Hold (Positioning): No assistance needed to correctly position infant at breast.(mom required very little assistance to correct positioning at the breast)  LATCH Score: 8  Interventions Interventions: Breast feeding basics reviewed;Assisted with latch;Skin to skin;Breast massage;Hand express;Breast compression;Adjust position;Support pillows;Position options  Lactation Tools Discussed/Used WIC Program: No   Consult Status Consult Status: Follow-up Date: 04/14/18 Follow-up type: In-patient    Taelyr Jantz Venetia ConstableS Manali Mcelmurry 04/13/2018, 2:28 PM

## 2018-04-13 NOTE — Anesthesia Postprocedure Evaluation (Signed)
Anesthesia Post Note  Patient: Hannah Ball  Procedure(s) Performed: AN AD HOC LABOR EPIDURAL     Patient location during evaluation: Mother Baby Anesthesia Type: Epidural Level of consciousness: awake and alert, oriented and patient cooperative Pain management: pain level controlled Vital Signs Assessment: post-procedure vital signs reviewed and stable Respiratory status: spontaneous breathing Cardiovascular status: stable Postop Assessment: no headache, epidural receding, patient able to bend at knees and no signs of nausea or vomiting Anesthetic complications: no Comments: Pain score 5.  Pt states pain is manageable.    Last Vitals:  Vitals:   04/13/18 1030 04/13/18 1435  BP: 98/64 (!) 98/56  Pulse: 81 77  Resp: 16 16  Temp: (!) 36.3 C 36.8 C  SpO2:      Last Pain:  Vitals:   04/13/18 1435  TempSrc: Oral  PainSc:    Pain Goal:                 [facility] HosWRINKLE,Veto Macqueen

## 2018-04-13 NOTE — Progress Notes (Signed)
Labor Progress Note  Amado NashJessica L Young-Garcia is a 27 y.o. G3P2002 at 291w1d admitted for induction of labor due to Elective at term.  S: Contractions are getting worse. Requesting epidural.   O:  BP 101/68   Pulse 92   Temp (!) 97.4 F (36.3 C) (Oral)   Resp 18   Ht 5\' 8"  (1.727 m)   Wt 95.8 kg (211 lb 1.6 oz)   LMP 06/30/2017   SpO2 100%   BMI 32.10 kg/m   No intake/output data recorded.  FHT:  FHR: 125 bpm, variability: moderate,  accelerations:  Present,  decelerations:  Absent UC:   2-4 min, regular SVE:   Dilation: 3.5 Effacement (%): 60 Station: -3 Exam by:: Sandy SalaamLynnsey Johnson, RN  Pitocin @ 10 mu/min  Labs: Lab Results  Component Value Date   WBC 8.7 04/13/2018   HGB 10.8 (L) 04/13/2018   HCT 34.6 (L) 04/13/2018   MCV 72.4 (L) 04/13/2018   PLT 181 04/13/2018    Assessment / Plan: 27 y.o. G3P2002 721w1d in early labor Induction of labor due to elective at term,  progressing well on pitocin  Labor: Progressing on Pitocin, will continue to increase then AROM Fetal Wellbeing:  Category I Pain Control:  Epidural Anticipated MOD:  NSVD  Expectant management   Caryl AdaJazma Phelps, DO OB Fellow [facility], [facility]

## 2018-04-13 NOTE — Anesthesia Procedure Notes (Signed)
Epidural Patient location during procedure: OB Start time: 04/13/2018 4:49 AM End time: 04/13/2018 5:12 AM  Staffing Anesthesiologist: Jairo BenJackson, Alyssha Housh, MD Performed: anesthesiologist   Preanesthetic Checklist Completed: patient identified, surgical consent, pre-op evaluation, timeout performed, IV checked, risks and benefits discussed and monitors and equipment checked  Epidural Patient position: sitting Prep: site prepped and draped and DuraPrep Patient monitoring: blood pressure, continuous pulse ox and heart rate Approach: midline Location: L3-L4 Injection technique: LOR air  Needle:  Needle type: Tuohy  Needle gauge: 17 G Needle length: 9 cm Needle insertion depth: 5.5 cm Catheter type: closed end flexible Catheter size: 19 Gauge Catheter at skin depth: 11 cm Test dose: negative (1% lidocaine)  Assessment Events: blood not aspirated, injection not painful, no injection resistance, negative IV test and no paresthesia  Additional Notes Pt identified in Labor room.  Monitors applied. Working IV access confirmed. Sterile prep, drape lumbar spine.  1% lido local L 3,4.  #17ga Touhy LOR air at 5.5 cm L 3,4, cath in easily to 11 cm skin. Test dose OK, cath dosed and infusion begun.  Patient asymptomatic, VSS, no heme aspirated, tolerated well.  Sandford Craze Henriette Hesser, MDReason for block:procedure for pain

## 2018-04-13 NOTE — H&P (Addendum)
Obstetric History and Physical  Hannah Ball is a 27 y.o. U9W1191 with IUP at [redacted]w[redacted]d presenting for elective IOL. Patient states she has been having  none contractions, none vaginal bleeding, intact membranes, with active fetal movement.  No blurry vision, headaches or peripheral edema, and RUQ pain.   Prenatal Course Source of Care: [place] Dating: By early [place] --->  Estimated Date of Delivery: 04/19/18 Pregnancy complications or risks: Patient Active Problem List   Diagnosis Date Noted  . History of macrosomia in infant in prior pregnancy, currently pregnant 09/26/2017  . Short interval between pregnancies affecting pregnancy, antepartum 08/29/2017  . Rubella non-immune status, antepartum 08/14/2017  . Supervision of other normal pregnancy, antepartum 08/06/2017  . Rh negative, antepartum 10/16/2016   She plans to breastfeed She desires oral progesterone-only contraceptive for postpartum contraception.   Sono:    @[redacted]w[redacted]d , CWD, normal anatomy, cephalic presentation, posterior placenta, 1645g, 69% EFW  Prenatal labs and studies: ABO, Rh: A/Negative/-- (10/08 0915) Antibody: Negative (04/03 0913) Rubella: <0.90 (10/08 0915) RPR: Non Reactive (03/12 0902)  HBsAg: Negative (10/08 0915)  HIV: Non Reactive (03/12 0902)  GBS: Negative 2 hr Glucola normal Genetic screening normal Anatomy US normal  Prenatal Transfer Tool  Maternal Diabetes: No Genetic Screening: Normal Maternal Ultrasounds/Referrals: Normal Fetal Ultrasounds or other Referrals:  None Maternal Substance Abuse:  No Significant Maternal Medications:  None Significant Maternal Lab Results: Lab values include: Rh negative  Past Medical History:  Diagnosis Date  . Nausea/vomiting in pregnancy 10/26/2016   Trial of diclegis   . Ovarian cyst   . Type A blood, Rh negative     Past Surgical History:  Procedure Laterality Date  . LAPAROSCOPIC OVARIAN CYSTECTOMY    . OVARIAN CYST REMOVAL  2008    OB History   Gravida Para Term Preterm AB Living  3 2 2  0 0 2  SAB TAB Ectopic Multiple Live Births  0 0 0 0 2    # Outcome Date GA Lbr Len/2nd Weight Sex Delivery Anes PTL Lv  3 Current           2 Term 05/09/17 [redacted]w[redacted]d 11:49 / 02:13 4.88 kg (10 lb 12.1 oz) M Vag-Vacuum EPI  LIV     Birth Comments: WNL  1 Term 2013   3.544 kg (7 lb 13 oz) M Vag-Spont EPI  LIV     Complications: Ovarian cyst during pregnancy    Social History   Socioeconomic History  . Marital status: Married    Spouse name: Not on file  . Number of children: Not on file  . Years of education: Not on file  . Highest education level: Not on file  Occupational History  . Not on file  Social Needs  . Financial resource strain: Not on file  . Food insecurity:    Worry: Not on file    Inability: Not on file  . Transportation needs:    Medical: Not on file    Non-medical: Not on file  Tobacco Use  . Smoking status: Never Smoker  . Smokeless tobacco: Never Used  Substance and Sexual Activity  . Alcohol use: No  . Drug use: No  . Sexual activity: Yes    Partners: Male    Birth control/protection: None    Comment: last intercourse was 06/16/17-unprotected  Lifestyle  . Physical activity:    Days per week: Not on file    Minutes per session: Not on file  . Stress: Not on file  Relationships  .  Social connections:    Talks on phone: Not on file    Gets together: Not on file    Attends religious service: Not on file    Active member of club or organization: Not on file    Attends meetings of clubs or organizations: Not on file    Relationship status: Not on file  Other Topics Concern  . Not on file  Social History Narrative   ** Merged History Encounter **        Family History  Problem Relation Age of Onset  . Diabetes Maternal Grandfather     No medications prior to admission.    Allergies  Allergen Reactions  . Benadryl [Diphenhydramine Hcl (Sleep)] Hives  . Latex Hives  . Neosporin Plus Max St  Other (See Comments)    Skin burns  . Neosporin Wound Cleanser [[place] Chloride] Hives  . Penicillins Rash    Has patient had a PCN reaction causing immediate rash, facial/tongue/throat swelling, SOB or lightheadedness with hypotension: Yes Has patient had a PCN reaction causing severe rash involving mucus membranes or skin necrosis: No Has patient had a PCN reaction that required hospitalization No Has patient had a PCN reaction occurring within the last 10 years: No If all of the above answers are "NO", then may proceed with Cephalosporin use.     Review of Systems: Negative except for what is mentioned in HPI.  Physical Exam: LMP 06/30/2017  CONSTITUTIONAL: Well-developed, well-nourished female in no acute distress.  HENT:  Normocephalic, atraumatic, External right and left ear normal. Oropharynx is clear and moist EYES: Conjunctivae and EOM are normal. Pupils are equal, round, and reactive to light. No scleral icterus.  NECK: Normal range of motion, supple, no masses SKIN: Skin is warm and dry. No rash noted. Not diaphoretic. No erythema. No pallor. NEUROLOGIC: Alert and oriented to person, place, and time. Normal reflexes, muscle tone coordination. No cranial nerve deficit noted. PSYCHIATRIC: Normal mood and affect. Normal behavior. Normal judgment and thought content. CARDIOVASCULAR: Normal heart rate noted, regular rhythm RESPIRATORY: Effort and breath sounds normal, no problems with respiration noted ABDOMEN: Soft, nontender, nondistended, gravid. MUSCULOSKELETAL: Normal range of motion. No edema and no tenderness. 2+ distal pulses.  Cervical Exam: Dilation: 3.5 Effacement (%): 60 Cervical Position: Posterior Station: -2 Presentation: Vertex Exam by:: A.Ethelene BrownsAnthony, RN  FHT:  Baseline rate 120 bpm   Variability moderate  Accelerations present   Decelerations none Contractions: irregular   Pertinent Labs/Studies:   No results found for this or any previous visit (from  the past 24 hour(s)).  Assessment : Hannah Ball is a 27 y.o. G3P2002 at 1544w1d being admitted for elective induction of labor.  Plan: Labor: Induction with Pitocin. Cervix ripe.  Analgesia as needed. Planning on epidural FWB: Reassuring fetal heart tracing.   GBS negative Delivery plan: Hopeful for vaginal delivery   Hannah AdaJazma Latia Mataya, DO OB Fellow Faculty Practice, [facility] -  04/13/2018, 12:31 AM

## 2018-04-14 ENCOUNTER — Telehealth: Payer: Self-pay | Admitting: Family Medicine

## 2018-04-14 MED ORDER — RHO D IMMUNE GLOBULIN 1500 UNIT/2ML IJ SOSY
300.0000 ug | PREFILLED_SYRINGE | Freq: Once | INTRAMUSCULAR | Status: AC
  Administered 2018-04-14: 300 ug via INTRAMUSCULAR
  Filled 2018-04-14: qty 2

## 2018-04-14 MED ORDER — IBUPROFEN 600 MG PO TABS: 600.0000 mg | ORAL_TABLET | Freq: Four times a day (QID) | ORAL | 0 refills | Status: DC

## 2018-04-14 MED ORDER — MEASLES, MUMPS & RUBELLA VAC ~~LOC~~ INJ
0.5000 mL | INJECTION | Freq: Once | SUBCUTANEOUS | Status: AC
  Administered 2018-04-14: 0.5 mL via SUBCUTANEOUS
  Filled 2018-04-14 (×2): qty 0.5

## 2018-04-14 MED ORDER — NORETHINDRONE 0.35 MG PO TABS: 1.0000 | ORAL_TABLET | Freq: Every day | ORAL | 11 refills | Status: DC

## 2018-04-14 NOTE — Progress Notes (Signed)
Pt escorted out of hospital before discharge orders were put in. Pt left without RN knowledge. Pt contacted and came back for paperwork, discharge teaching, MMR vaccine, and PP assessment. 

## 2018-04-14 NOTE — Telephone Encounter (Signed)
Pt was escorted out of hospital before being discharged. Attempted to call pt on cell and emergency contact but was unable to get them. No VM was set up to leave a message. Will send Rx for POP to pharmacy. Pt has scheduled PP f/u on 05/22/18 @ 01:00PM with Hannah Ball.

## 2018-04-14 NOTE — Discharge Instructions (Signed)
Vaginal Delivery, Care After °Refer to this sheet in the next few weeks. These instructions provide you with information about caring for yourself after vaginal delivery. Your health care provider may also give you more specific instructions. Your treatment has been planned according to current medical practices, but problems sometimes occur. Call your health care provider if you have any problems or questions. °What can I expect after the procedure? °After vaginal delivery, it is common to have: °· Some bleeding from your vagina. °· Soreness in your abdomen, your vagina, and the area of skin between your vaginal opening and your anus (perineum). °· Pelvic cramps. °· Fatigue. ° °Follow these instructions at home: °Medicines °· Take over-the-counter and prescription medicines only as told by your health care provider. °· If you were prescribed an antibiotic medicine, take it as told by your health care provider. Do not stop taking the antibiotic until it is finished. °Driving ° °· Do not drive or operate heavy machinery while taking prescription pain medicine. °· Do not drive for 24 hours if you received a sedative. °Lifestyle °· Do not drink alcohol. This is especially important if you are breastfeeding or taking medicine to relieve pain. °· Do not use tobacco products, including cigarettes, chewing tobacco, or e-cigarettes. If you need help quitting, ask your health care provider. °Eating and drinking °· Drink at least 8 eight-ounce glasses of water every day unless you are told not to by your health care provider. If you choose to breastfeed your baby, you may need to drink more water than this. °· Eat high-fiber foods every day. These foods may help prevent or relieve constipation. High-fiber foods include: °? Whole grain cereals and breads. °? Brown rice. °? Beans. °? Fresh fruits and vegetables. °Activity °· Return to your normal activities as told by your health care provider. Ask your health care provider  what activities are safe for you. °· Rest as much as possible. Try to rest or take a nap when your baby is sleeping. °· Do not lift anything that is heavier than your baby or 10 lb (4.5 kg) until your health care provider says that it is safe. °· Talk with your health care provider about when you can engage in sexual activity. This may depend on your: °? Risk of infection. °? Rate of healing. °? Comfort and desire to engage in sexual activity. °Vaginal Care °· If you have an episiotomy or a vaginal tear, check the area every day for signs of infection. Check for: °? More redness, swelling, or pain. °? More fluid or blood. °? Warmth. °? Pus or a bad smell. °· Do not use tampons or douches until your health care provider says this is safe. °· Watch for any blood clots that may pass from your vagina. These may look like clumps of dark red, brown, or black discharge. °General instructions °· Keep your perineum clean and dry as told by your health care provider. °· Wear loose, comfortable clothing. °· Wipe from front to back when you use the toilet. °· Ask your health care provider if you can shower or take a bath. If you had an episiotomy or a perineal tear during labor and delivery, your health care provider may tell you not to take baths for a certain length of time. °· Wear a bra that supports your breasts and fits you well. °· If possible, have someone help you with household activities and help care for your baby for at least a few days after   you leave the hospital. °· Keep all follow-up visits for you and your baby as told by your health care provider. This is important. °Contact a health care provider if: °· You have: °? Vaginal discharge that has a bad smell. °? Difficulty urinating. °? Pain when urinating. °? A sudden increase or decrease in the frequency of your bowel movements. °? More redness, swelling, or pain around your episiotomy or vaginal tear. °? More fluid or blood coming from your episiotomy or  vaginal tear. °? Pus or a bad smell coming from your episiotomy or vaginal tear. °? A fever. °? A rash. °? Little or no interest in activities you used to enjoy. °? Questions about caring for yourself or your baby. °· Your episiotomy or vaginal tear feels warm to the touch. °· Your episiotomy or vaginal tear is separating or does not appear to be healing. °· Your breasts are painful, hard, or turn red. °· You feel unusually sad or worried. °· You feel nauseous or you vomit. °· You pass large blood clots from your vagina. If you pass a blood clot from your vagina, save it to show to your health care provider. Do not flush blood clots down the toilet without having your health care provider look at them. °· You urinate more than usual. °· You are dizzy or light-headed. °· You have not breastfed at all and you have not had a menstrual period for 12 weeks after delivery. °· You have stopped breastfeeding and you have not had a menstrual period for 12 weeks after you stopped breastfeeding. °Get help right away if: °· You have: °? Pain that does not go away or does not get better with medicine. °? Chest pain. °? Difficulty breathing. °? Blurred vision or spots in your vision. °? Thoughts about hurting yourself or your baby. °· You develop pain in your abdomen or in one of your legs. °· You develop a severe headache. °· You faint. °· You bleed from your vagina so much that you fill two sanitary pads in one hour. °This information is not intended to replace advice given to you by your health care provider. Make sure you discuss any questions you have with your health care provider. °Document Released: 10/13/2000 Document Revised: 03/29/2016 Document Reviewed: 10/31/2015 °Elsevier Interactive Patient Education © 2018 [employer] ° °

## 2018-04-14 NOTE — Progress Notes (Signed)
Post Partum Day 1 Subjective: no complaints, up ad lib, voiding and tolerating PO  Objective: Blood pressure 94/63, pulse 66, temperature 98.6 F (37 C), temperature source Oral, resp. rate 18, height _0  (1.727 m), weight 95.8 kg (211 lb 1.6 oz), last menstrual period 06/30/2017, SpO2 99 %, unknown if currently breastfeeding.  Physical Exam:  General: alert, cooperative and no distress Lochia: appropriate Uterine Fundus: firm Incision: N/A DVT Evaluation: No evidence of DVT seen on physical exam. No cords or calf tenderness.  Recent Labs    04/13/18 0046  HGB 10.8*  HCT 34.6*    Assessment/Plan: Plan for discharge tomorrow Will need Rhogam prior to discharge MMR prior to discharge POPs for contraception. Give at d/c. Breastfeeding; lactation consultation     LOS: 1 day   Luiz Blare, DO 04/14/2018, 7:48 AM

## 2018-04-14 NOTE — Discharge Summary (Addendum)
OB Discharge Summary     Patient Name: Hannah Ball DOB: 03-22-1991 MRN: [id number]  Date of admission: 04/13/2018 Delivering MD: Pincus Large   Date of discharge: 04/14/2018  Admitting diagnosis: 39wks induction Intrauterine pregnancy: [redacted]w[redacted]d     Secondary diagnosis:  Active Problems:   Indication for care in labor or delivery   SVD (spontaneous vaginal delivery)  Additional problems: Rubella Non-immune, Rh Neg      Discharge diagnosis: Term Pregnancy Delivered                                                                                                Post partum procedures:rhogam  Augmentation: AROM and Pitocin  Complications: None  Hospital course:  Induction of Labor With Vaginal Delivery   27 y.o. yo G3P3003 at [redacted]w[redacted]d was admitted to the hospital 04/13/2018 for induction of labor.  Indication for induction: Elective.  Patient had an uncomplicated labor course as follows: Membrane Rupture Time/Date: 6:35 AM ,04/13/2018   Intrapartum Procedures: Episiotomy: None [1]                                         Lacerations:  None [1]  Patient had delivery of a Viable infant.  Information for the patient's newborn:  Nashika, Coker Girl Gerturde [[id number]]  Delivery Method: Vaginal, Spontaneous(Filed from Delivery Summary)   04/13/2018  Details of delivery can be found in separate delivery note.  Patient had a routine postpartum course. Patient is discharged home 04/14/18.  Physical exam  Vitals:   04/13/18 1907 04/13/18 2300 04/14/18 0526 04/14/18 1526  BP: 106/73 98/60 94/63  (!) 99/57  Pulse: 83 79 66 75  Resp: 18 18 18    Temp: 98.9 F (37.2 C) 98.2 F (36.8 C) 98.6 F (37 C) 98.3 F (36.8 C)  TempSrc:  Oral Oral Oral  SpO2:      Weight:      Height:       Physical Exam:  General: alert, cooperative and no distress Lochia: appropriate Uterine Fundus: firm Incision: N/A DVT Evaluation: No evidence of DVT seen on physical exam. No cords or calf  tenderness.   Labs: Lab Results  Component Value Date   WBC 8.7 04/13/2018   HGB 10.8 (L) 04/13/2018   HCT 34.6 (L) 04/13/2018   MCV 72.4 (L) 04/13/2018   PLT 181 04/13/2018   CMP Latest Ref Rng & Units 01/10/2018  Glucose 65 - 99 mg/dL 92  BUN 6 - 20 mg/dL 6  Creatinine 7.82 - 9.56 mg/dL 2.13  Sodium 086 - 578 mmol/L 133(L)  Potassium 3.5 - 5.1 mmol/L 3.6  Chloride 101 - 111 mmol/L 108  CO2 22 - 32 mmol/L 17(L)  Calcium 8.9 - 10.3 mg/dL 8.1(L)  Total Protein 6.0 - 8.5 g/dL -  Total Bilirubin 0.0 - 1.2 mg/dL -  Alkaline Phos 39 - 469 IU/L -  AST 0 - 40 IU/L -  ALT 0 - 32 IU/L -    Discharge instruction: per After Visit Summary  and "Baby and Me Booklet".  After visit meds:  Allergies as of 04/14/2018      Reactions   Benadryl [diphenhydramine Hcl (sleep)] Hives   Latex Hives   Neosporin Plus Max St Other (See Comments)   Skin burns   Neosporin Wound Cleanser [benzalkonium Chloride] Hives   Penicillins Rash   Has patient had a PCN reaction causing immediate rash, facial/tongue/throat swelling, SOB or lightheadedness with hypotension: Yes Has patient had a PCN reaction causing severe rash involving mucus membranes or skin necrosis: No Has patient had a PCN reaction that required hospitalization No Has patient had a PCN reaction occurring within the last 10 years: No If all of the above answers are "NO", then may proceed with Cephalosporin use.      Medication List    TAKE these medications   acetaminophen 325 MG tablet Commonly known as:  TYLENOL Take 650 mg by mouth every 6 (six) hours as needed for headache.   ibuprofen 600 MG tablet Commonly known as:  ADVIL,MOTRIN Take 1 tablet (600 mg total) by mouth every 6 (six) hours. Start taking on:  04/15/2018   norethindrone 0.35 MG tablet Commonly known as:  MICRONOR,CAMILA,ERRIN Take 1 tablet (0.35 mg total) by mouth daily.       Diet: routine diet  Activity: Advance as tolerated. Pelvic rest for 6 weeks.    Outpatient follow up:6 weeks Follow up Appt: Future Appointments  Date Time Provider Department Center  05/22/2018  1:00 PM Raelyn Moraawson, [place], CNM [facility]   Follow up Visit:No follow-ups on file.  Postpartum contraception: Progesterone only pills  Newborn Data: Live born female  Birth Weight: 8 lb 5.7 oz (3790 g) APGAR: 8, 9  Newborn Delivery   Birth date/time:  04/13/2018 06:40:00 Delivery type:  Vaginal, Spontaneous     Baby Feeding: Bottle and Breast Disposition:home with mother   04/14/2018 Garnette GunnerAaron B Thompson, MD  OB FELLOW DISCHARGE ATTESTATION  I have seen and examined this patient and agree with above documentation in the resident's note.   Frederik PearJulie P Maysen Sudol, MD OB Fellow

## 2018-04-14 NOTE — Progress Notes (Addendum)
Pt not rubella immune. Went to order the MMR and shows contraindicated due to neosporin allergy. Pharmacy called to clarify the contraindication, and they requested that I finish placing the order and then they will review.   Pharmacy gave the okay to give MMR vaccine, as a dermatitis reaction is not considered a true contraindication for the vaccine.

## 2018-04-15 LAB — RH IG WORKUP (INCLUDES ABO/RH)
ABO/RH(D): A NEG
Fetal Screen: NEGATIVE
Gestational Age(Wks): 39.1
Unit division: 0

## 2018-04-19 ENCOUNTER — Ambulatory Visit (INDEPENDENT_AMBULATORY_CARE_PROVIDER_SITE_OTHER): Payer: Medicaid Other | Admitting: Obstetrics & Gynecology

## 2018-04-19 ENCOUNTER — Encounter: Payer: Self-pay | Admitting: Obstetrics & Gynecology

## 2018-04-19 VITALS — BP 97/63 | HR 72 | Wt 197.2 lb

## 2018-04-19 DIAGNOSIS — K649 Unspecified hemorrhoids: Secondary | ICD-10-CM

## 2018-04-19 MED ORDER — HYDROCORTISONE 2.5 % RE CREA: 1.0000 "application " | TOPICAL_CREAM | Freq: Three times a day (TID) | RECTAL | 6 refills | Status: DC

## 2018-04-19 NOTE — Progress Notes (Signed)
   Subjective:    Patient ID: Hannah Ball, female    DOB: Aug 07, 1991, 27 y.o.   MRN: [id number]  HPI 27 yo P3 here for a check of hemorrhoids. She has had these with each of her pregnancies but they are feeling worse over the last 3 days. Her newborn is 396 days old. She has tried [employer] but nothing else.   Review of Systems     Objective:   Physical Exam Breathing, conversing, and ambulating normally Well nourished, well hydrated White female, no apparent distress Large, non-thrombosed external hemorrhoids noted      Assessment & Plan:  Symptomatic hemorrhoids- Anusol HC prescribed Rec Miralax or other stool softener of choice Keep postpartum appt.

## 2018-04-25 IMAGING — US US MFM OB COMP +14 WKS
1 series · 14 of 28 positions shown · non-contrast
Comparison: none

[Series 1: us mfm ob comp +14 wks · 64 acquisitions, 14 frames shown]
[im 3/64]
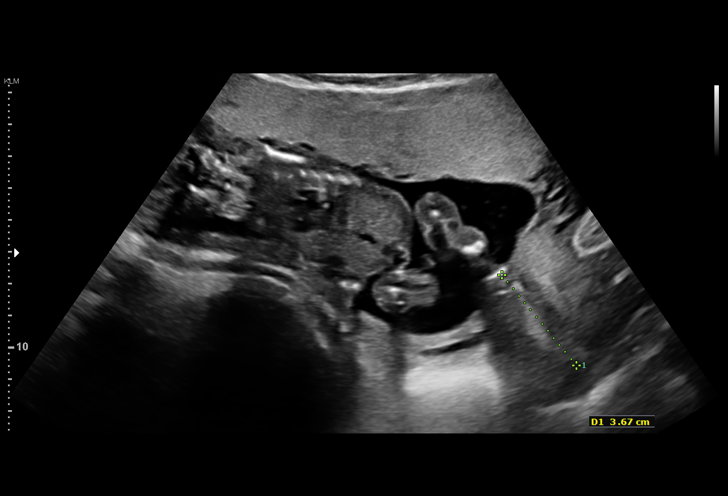
[im 8/64]
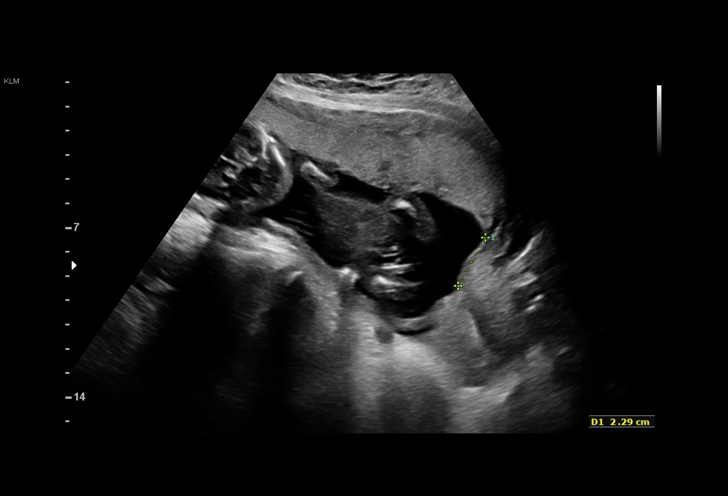
[im 12/64]
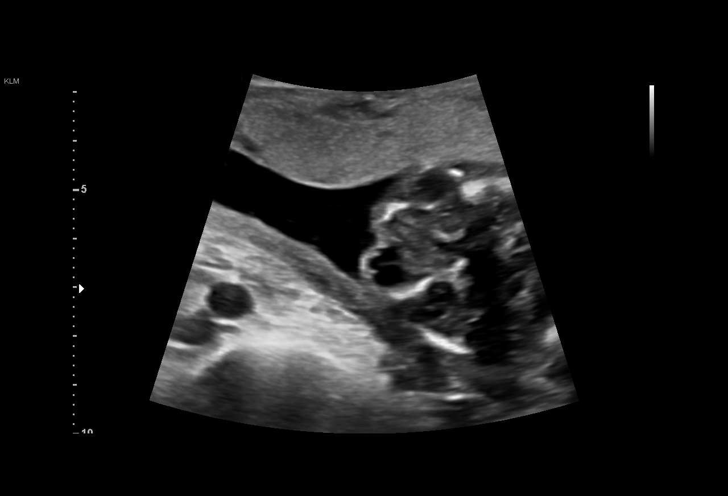
[im 17/64]
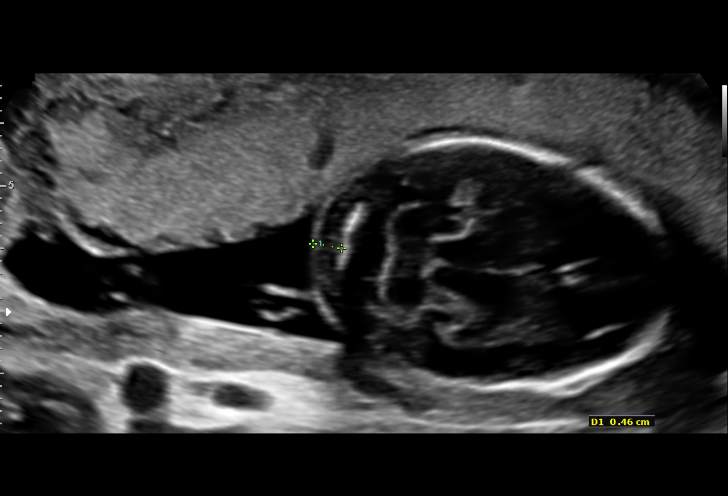
[im 22/64]
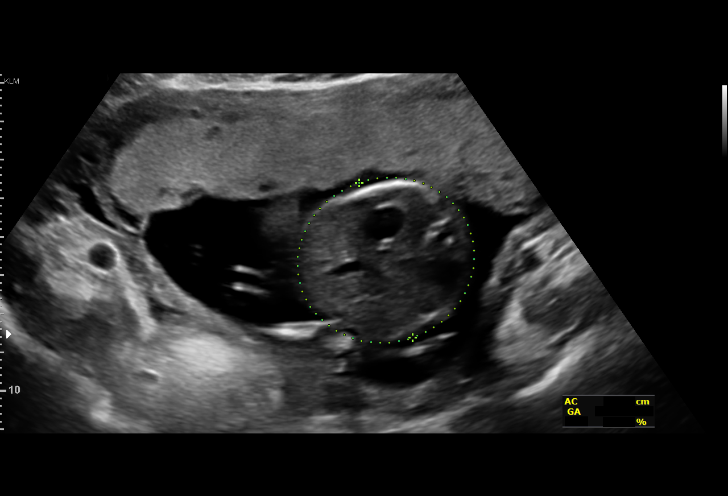
[im 26/64]
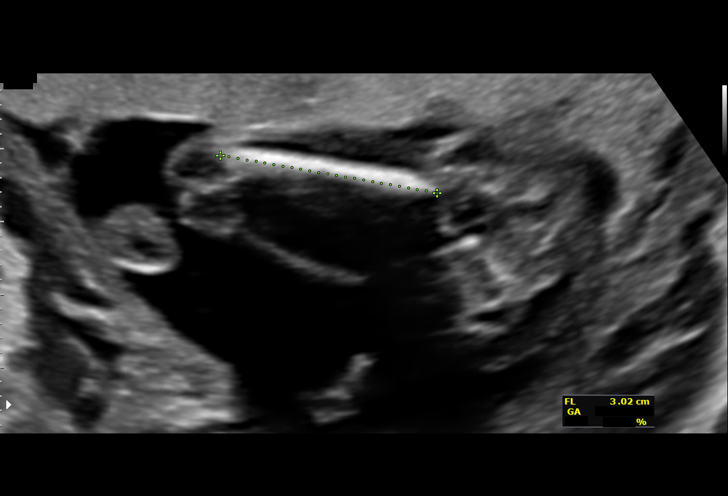
[im 31/64]
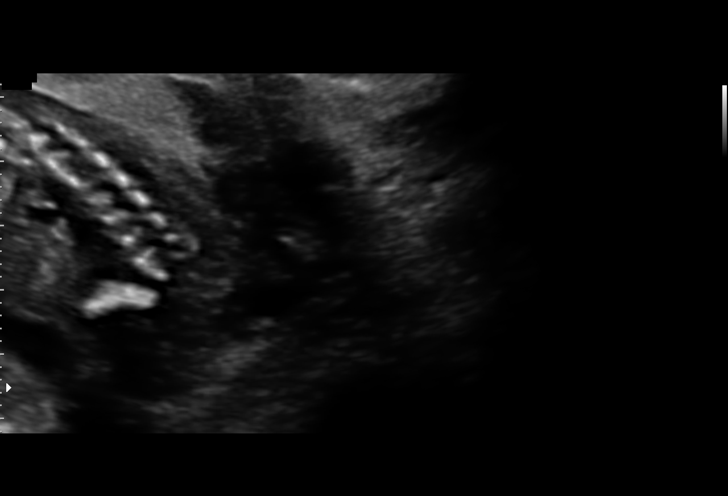
[im 36/64]
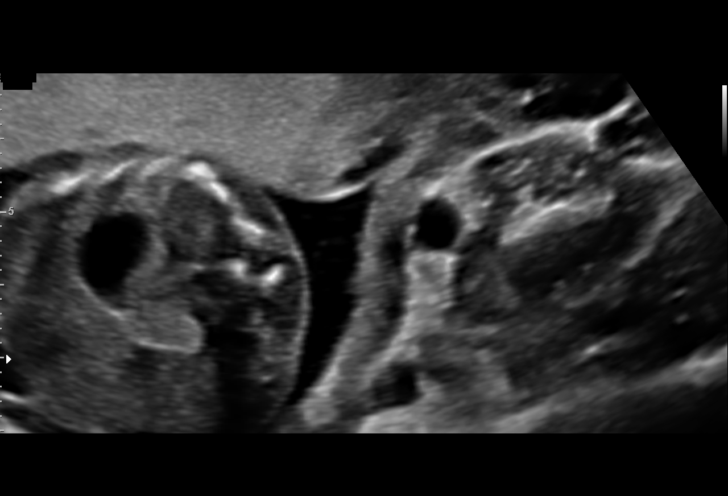
[im 40/64]
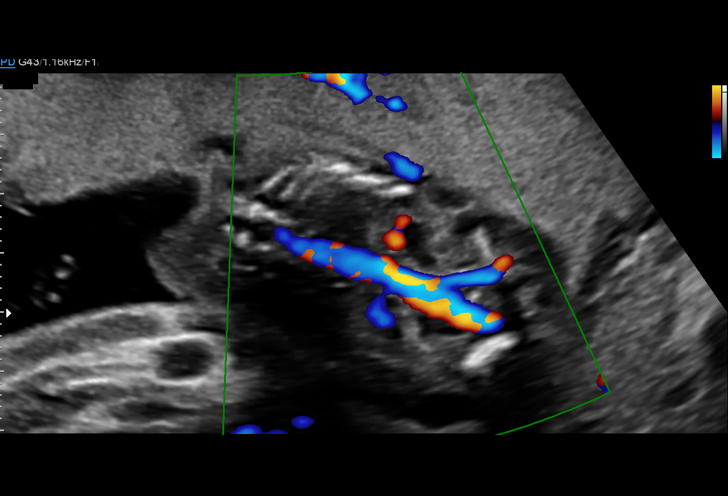
[im 45/64]
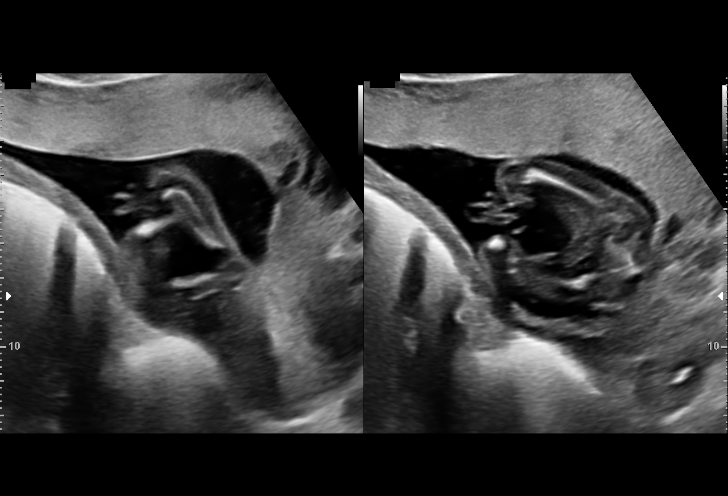
[im 50/64]
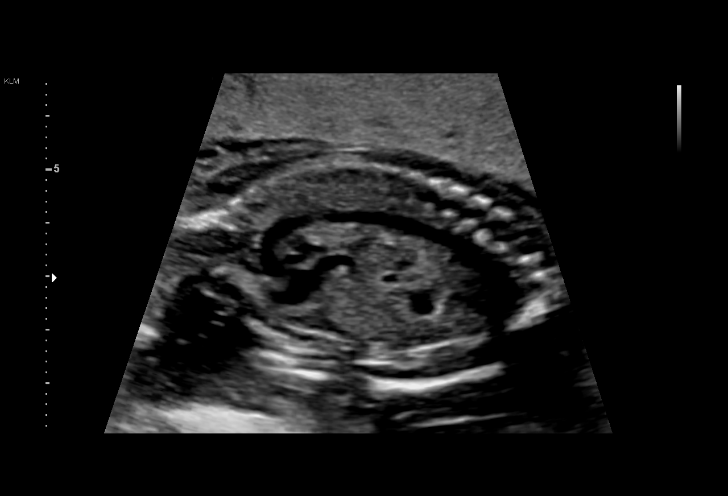
[im 54/64]
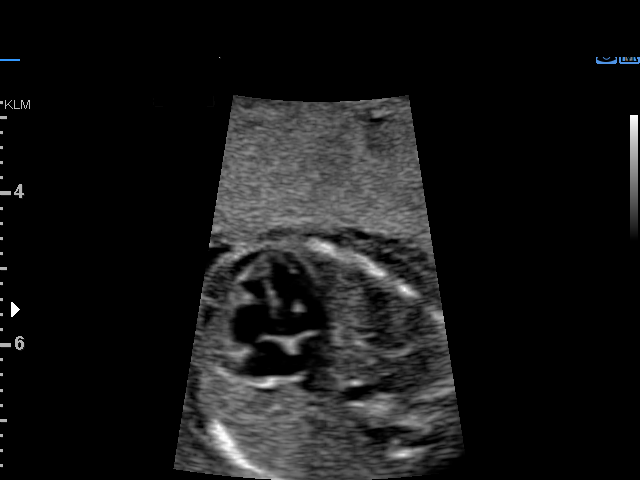
[im 59/64]
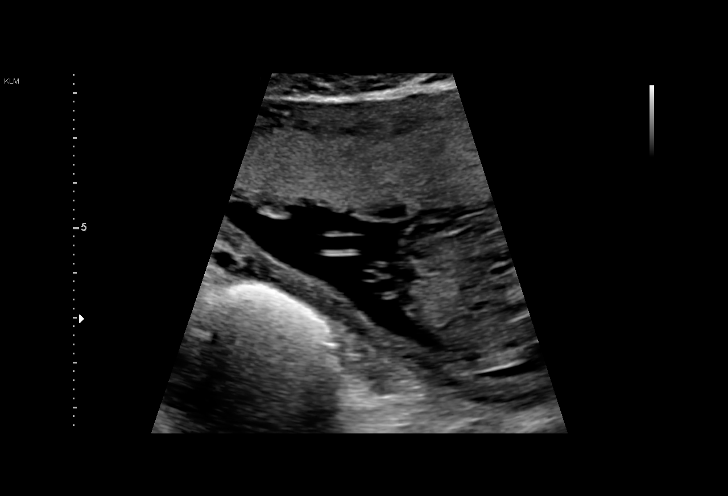
[im 64/64]
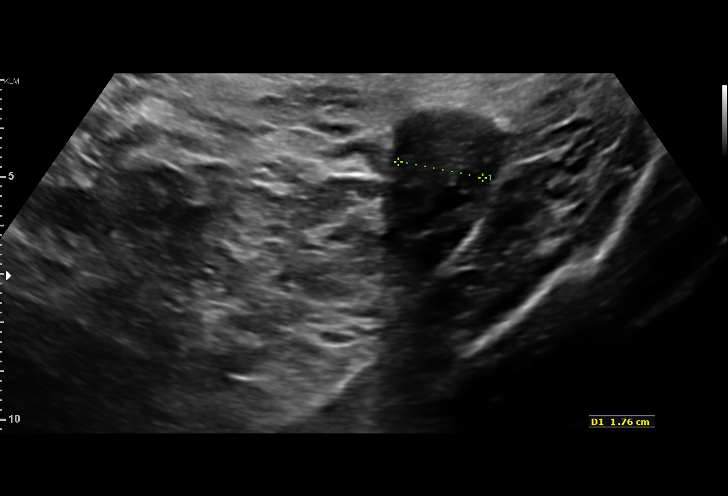

[14 of 28 positions shown; findings below may reference images not displayed]

am)

1  PETA GOSA          [id number]      [id number]     [id number]
Indications

18 weeks gestation of pregnancy
Encounter for antenatal screening for
malformations
Rh negative state in antepartum
OB History

Blood Type:            Height:  5'8"   Weight (lb):  186       BMI:
Gravidity:    2         Term:   1
Living:       1
Fetal Evaluation

Num Of Fetuses:     1
Fetal Heart         148
Rate(bpm):
Cardiac Activity:   Observed
Presentation:       Breech
Placenta:           Anterior, above cervical os
P. Cord Insertion:  Visualized, central

Amniotic Fluid
AFI FV:      Subjectively within normal limits
Gestational Age

LMP:           18w 4d        Date:  07/30/16                 EDD:   05/06/17
Best:          18w 4d     Det. By:  LMP  (07/30/16)          EDD:   05/06/17
Anatomy
Cranium:               Appears normal         Aortic Arch:            Appears normal
Cavum:                 Appears normal         Ductal Arch:            Not well visualized
Ventricles:            Appears normal         Diaphragm:              Appears normal
Choroid Plexus:        Appears normal         Stomach:                Appears normal, left
sided
Cerebellum:            Appears normal         Abdomen:                Appears normal
Posterior Fossa:       Appears normal         Abdominal Wall:         Appears nml (cord
insert, abd wall)
Nuchal Fold:           Appears normal         Cord Vessels:           Appears normal (3
vessel cord)
Face:                  Appears normal         Kidneys:                Appear normal
(orbits and profile)
Lips:                  Appears normal         Bladder:                Appears normal
Thoracic:              Appears normal         Spine:                  Appears normal
Heart:                 Echogenic focus        Upper Extremities:      Appears normal
in LV
RVOT:                  Not well visualized    Lower Extremities:      Appears normal
LVOT:                  Appears normal

Other:  Fetus appears to be a male. Heels and 5th digit visualized. Nasal
bone visualized.
Cervix Uterus Adnexa

Cervix
Length:            3.7  cm.
Normal appearance by transabdominal scan.
Comments

An echogenic focus was seen in the left cardiac ventricle.
However, no gross fetal anomalies or other soft markers of
aneuploidy were identified.   An echogenic intracardiac focus
is felt to represent a calcified papillary muscle, and is not
associated with structural or functional cardiac abnormalities.
Although an echogenic cardiac focus may be associated with
an increased risk of Down syndrome, this risk is felt to be
minimal, especially when it is seen as an isolated finding.
Impression

Single IUP at 18w 4d
An echogenic intracardiac focus was noted in the left
ventricle (see comments above)
Limited views of the fetal heart obtained
The remainder of the fetal anatomy appears normal
Anterior placenta without previa
Normal amniotic fluid volume
Recommendations

Recommend follow-up ultrasound examination in 4 weeks to
complete anatomy

## 2018-05-21 NOTE — Progress Notes (Deleted)
Post Partum Exam  Hannah Ball is a 27 y.o. 783P3003 female who presents for a postpartum visit. She is {1-10:13787} {time; units:18646} postpartum following a  vaginal delivery:12449}. I have fully reviewed the prenatal and intrapartum course. The delivery was at 39 gestational weeks.  Anesthesia: Epidual Postpartum course has been uncomplicated. Baby's course has been uncomplicated. Baby is feeding by {breast/bottle:69}. Bleeding {vag bleed:12292}. Bowel function is {normal:32111}. Bladder function is {normal:32111}. Patient {is/is not:9024} sexually active. Contraception method is {contraceptive method:5051}. Postpartum depression screening:neg  {Common ambulatory SmartLinks:19316} Last pap smear done 10/10/2016 and was normal  Review of Systems {ros; complete:30496}    Objective:  unknown if currently breastfeeding.  General:  {gen appearance:16600}   Breasts:  {breast exam:1202::"inspection negative, no nipple discharge or bleeding, no masses or nodularity palpable"}  Lungs: {lung exam:16931}  Heart:  {heart exam:5510}  Abdomen: {abdomen exam:16834}   Vulva:  {labia exam:12198}  Vagina: {vagina exam:12200}  Cervix:  {cervix exam:14595}  Corpus: {uterus exam:12215}  Adnexa:  {adnexa exam:12223}  Rectal Exam: {rectal/vaginal exam:12274}        Assessment:    *** postpartum exam. Pap smear {done:10129} at today's visit.   Plan:   1. Contraception: {method:5051} 2. *** 3. Follow up in: {1-10:13787} {time; units:19136} or as needed.

## 2018-05-22 ENCOUNTER — Ambulatory Visit: Payer: Medicaid Other | Admitting: Obstetrics and Gynecology

## 2018-05-28 NOTE — Progress Notes (Signed)
Post Partum Exam  Hannah NashJessica L Ball is a 27 y.o. 263P3003 female who presents for a postpartum visit. She is 7 weeks postpartum following a spontaneous vaginal delivery. I have fully reviewed the prenatal and intrapartum course. The delivery was at 39 gestational weeks.  Anesthesia: epidural. Postpartum course has been unremarkable. Baby's course has been unremarkable. Baby is feeding by bottle - Enfamil with Iron. Bleeding staining only. Bowel function is normal. Bladder function is normal. Patient is sexually active. Contraception method is IUD. Postpartum depression screening:neg  Started progesterone only pill 2 weeks after birth. Has 1 episode of unprotected sex prior to starting POP. She took her last pill on Sunday, unprotected sex two times since that time (within the past 72 hrs). Missed 2-3 pills this month total.   The following portions of the patient's history were reviewed and updated as appropriate: allergies, current medications, past family history, past medical history, past social history, past surgical history and problem list.  Review of Systems Pertinent items are noted in HPI.    Objective:  Blood pressure 108/66, pulse 70, resp. rate 16, height 5\' 9"  (1.753 m), weight 197 lb 12.8 oz (89.7 kg), not currently breastfeeding. Body mass index is 29.21 kg/m.  General:  alert, cooperative and appears stated age   Breasts:  negative  Lungs: clear to auscultation bilaterally  Heart:  regular rate and rhythm, S1, S2 normal, no murmur, click, rub or gallop  Abdomen: soft, non-tender; bowel sounds normal; no masses,  no organomegaly   Vulva:  not examined  Vagina: not evaluated  Cervix:  not examined  Corpus: not examined  Adnexa:  not evaluated  Rectal Exam: Not performed.        Assessment:   Normal postpartum exam At risk for pregnancy given unprotected sex thus could not place IUD today   Plan:   1. Contraception: OCP (estrogen/progesterone) and ECP today  Hannah Ball(Ella). Discussed risk of pregnancy at length and offered several options for contraception including placement if cu-IUD for emergency and longer term contraception. She desires to take ECP and to start OCP for 2 weeks. She will return in 2 weeks and will need a UPT prior to placement.  2. Mood- stable 3. Infant feeding- exclusively formula feeding   Follow up in: 2 weeks or as needed.

## 2018-05-29 ENCOUNTER — Ambulatory Visit (INDEPENDENT_AMBULATORY_CARE_PROVIDER_SITE_OTHER): Payer: Medicaid Other | Admitting: Family Medicine

## 2018-05-29 ENCOUNTER — Encounter: Payer: Self-pay | Admitting: Family Medicine

## 2018-05-29 VITALS — BP 108/66 | HR 70 | Resp 16 | Ht 69.0 in | Wt 197.8 lb

## 2018-05-29 DIAGNOSIS — Z7251 High risk heterosexual behavior: Secondary | ICD-10-CM | POA: Diagnosis not present

## 2018-05-29 DIAGNOSIS — Z3202 Encounter for pregnancy test, result negative: Secondary | ICD-10-CM

## 2018-05-29 DIAGNOSIS — Z1389 Encounter for screening for other disorder: Secondary | ICD-10-CM | POA: Diagnosis not present

## 2018-05-29 DIAGNOSIS — IMO0001 Reserved for inherently not codable concepts without codable children: Secondary | ICD-10-CM

## 2018-05-29 LAB — POCT URINE PREGNANCY: PREG TEST UR: NEGATIVE

## 2018-05-29 MED ORDER — ULIPRISTAL ACETATE 30 MG PO TABS: 30.0000 mg | ORAL_TABLET | Freq: Once | ORAL | Status: DC

## 2018-05-29 MED ORDER — ULIPRISTAL ACETATE 30 MG PO TABS: 1.0000 | ORAL_TABLET | Freq: Once | ORAL | 0 refills | Status: AC

## 2018-05-29 MED ORDER — LO LOESTRIN FE 1 MG-10 MCG / 10 MCG PO TABS: 1.0000 | ORAL_TABLET | Freq: Every day | ORAL | 0 refills | Status: DC

## 2018-06-12 ENCOUNTER — Ambulatory Visit: Payer: Medicaid Other | Admitting: Family Medicine

## 2019-08-06 ENCOUNTER — Ambulatory Visit (INDEPENDENT_AMBULATORY_CARE_PROVIDER_SITE_OTHER): Payer: Medicaid Other | Admitting: Family Medicine

## 2019-08-06 ENCOUNTER — Other Ambulatory Visit: Payer: Self-pay

## 2019-08-06 ENCOUNTER — Encounter: Payer: Self-pay | Admitting: Family Medicine

## 2019-08-06 ENCOUNTER — Other Ambulatory Visit (HOSPITAL_COMMUNITY)
Admission: RE | Admit: 2019-08-06 | Discharge: 2019-08-06 | Disposition: A | Discharge status: Home / Self Care | Payer: Medicaid Other | Source: Ambulatory Visit | Attending: Family Medicine | Admitting: Family Medicine

## 2019-08-06 VITALS — BP 101/67 | HR 72 | Wt 199.8 lb

## 2019-08-06 DIAGNOSIS — Z0000 Encounter for general adult medical examination without abnormal findings: Secondary | ICD-10-CM | POA: Diagnosis not present

## 2019-08-06 DIAGNOSIS — Z113 Encounter for screening for infections with a predominantly sexual mode of transmission: Secondary | ICD-10-CM | POA: Diagnosis not present

## 2019-08-06 DIAGNOSIS — Z3202 Encounter for pregnancy test, result negative: Secondary | ICD-10-CM | POA: Diagnosis not present

## 2019-08-06 DIAGNOSIS — Z01419 Encounter for gynecological examination (general) (routine) without abnormal findings: Secondary | ICD-10-CM

## 2019-08-06 NOTE — Progress Notes (Signed)
STI
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
## 2019-08-08 LAB — HEPATITIS C ANTIBODY: Hep C Virus Ab: <0.1 s/co ratio (ref 0.0–0.9)

## 2019-08-08 LAB — HIV ANTIBODY (ROUTINE TESTING W REFLEX): HIV Screen 4th Generation wRfx: NONREACTIVE

## 2019-08-08 LAB — HSV(HERPES SMPLX)ABS-I+II(IGG+IGM)-BLD
HSV 1 Glycoprotein G Ab, IgG: 39.3 index — ABNORMAL HIGH (ref 0.00–0.90)
HSV 2 IgG, Type Spec: <0.91 index (ref 0.00–0.90)
HSVI/II Comb IgM: 1.24 Ratio — ABNORMAL HIGH (ref 0.00–0.90)

## 2019-08-08 LAB — RPR: RPR Ser Ql: NONREACTIVE

## 2019-08-08 LAB — HEPATITIS B SURFACE ANTIGEN: Hepatitis B Surface Ag: NEGATIVE

## 2019-08-13 ENCOUNTER — Telehealth: Payer: Self-pay | Admitting: Family Medicine

## 2019-08-13 LAB — CERVICOVAGINAL ANCILLARY ONLY
Bacterial Vaginitis (gardnerella): NEGATIVE
Candida Glabrata: NEGATIVE
Candida Vaginitis: NEGATIVE
Chlamydia: NEGATIVE
Comment: NEGATIVE
Comment: NEGATIVE
Comment: NEGATIVE
Comment: NEGATIVE
Comment: NEGATIVE
Comment: NORMAL
Neisseria Gonorrhea: NEGATIVE
Trichomonas: NEGATIVE

## 2019-08-13 NOTE — Progress Notes (Signed)
I called cytology this morning. They are waiting on the HPV results to come back. The lab has been back up since last week.

## 2019-08-13 NOTE — Telephone Encounter (Signed)
Called patient about results of HSV at her request. 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 Reviewed pathophysiology of HSV at length and discussed that is she develops blisters she need to return for testing with any provider given I am only at the [facility] office every other week. She reported understanding. We are still awaiting her GC/CT results. All other STI screenings negative.

## 2019-08-14 ENCOUNTER — Telehealth: Payer: Self-pay

## 2019-08-14 LAB — POCT URINE PREGNANCY: Preg Test, Ur: NEGATIVE

## 2019-08-14 NOTE — Telephone Encounter (Signed)
Call patient to inform her of test results. She was already aware of her results at this time. No other concerns.

## 2019-08-18 LAB — CYTOLOGY - PAP
Comment: NEGATIVE
Comment: NEGATIVE
Diagnosis: NEGATIVE
HPV 16: NEGATIVE
HPV 18 / 45: NEGATIVE
High risk HPV: POSITIVE — AB

## 2019-08-20 ENCOUNTER — Encounter: Payer: Self-pay | Admitting: Family Medicine

## 2019-08-20 ENCOUNTER — Other Ambulatory Visit: Payer: Self-pay

## 2019-08-20 ENCOUNTER — Ambulatory Visit (INDEPENDENT_AMBULATORY_CARE_PROVIDER_SITE_OTHER): Payer: Medicaid Other | Admitting: Family Medicine

## 2019-08-20 VITALS — BP 105/68 | HR 78 | Wt 201.2 lb

## 2019-08-20 DIAGNOSIS — Z3202 Encounter for pregnancy test, result negative: Secondary | ICD-10-CM

## 2019-08-20 DIAGNOSIS — Z30011 Encounter for initial prescription of contraceptive pills: Secondary | ICD-10-CM

## 2019-08-20 DIAGNOSIS — Z7251 High risk heterosexual behavior: Secondary | ICD-10-CM

## 2019-08-20 LAB — POCT URINE PREGNANCY: Preg Test, Ur: NEGATIVE

## 2019-08-20 MED ORDER — NORGESTIMATE-ETH ESTRADIOL 0.25-35 MG-MCG PO TABS
1.0000 | ORAL_TABLET | Freq: Every day | ORAL | 11 refills | Status: DC
Start: 2019-08-20 — End: 2019-10-06

## 2019-08-20 NOTE — Progress Notes (Signed)
   GYNECOLOGY PROBLEM  VISIT ENCOUNTER NOTE  Subjective:   Hannah Ball is a 28 y.o. [id number] female here for a a problem GYN visit.  Current complaints: breast tenderness and one episode of nausea.   Denies abnormal vaginal bleeding, discharge, pelvic pain, problems with intercourse or other gynecologic concerns.    Gynecologic History No LMP recorded. Contraception: condoms  Health Maintenance Due  Topic Date Due  . INFLUENZA VACCINE  05/31/2019     The following portions of the patient's history were reviewed and updated as appropriate: allergies, current medications, past family history, past medical history, past social history, past surgical history and problem list.  Review of Systems Pertinent items are noted in HPI.   Objective:  BP 105/68   Pulse 78   Wt 201 lb 3.2 oz (91.3 kg)   BMI 29.71 kg/m  Gen: well appearing, NAD HEENT: no scleral icterus CV: RR Lung: Normal WOB Ext: warm well perfused   Assessment and Plan:   1. Encounter for initial prescription of contraceptive pills - POCT urine pregnancy - norgestimate-ethinyl estradiol (ORTHO-CYCLEN) 0.25-35 MG-MCG tablet; Take 1 tablet by mouth daily.  Dispense: 1 Package; Refill: 11  2. Unprotected sex - Beta hCG quant (ref lab)   Please refer to After Visit Summary for other counseling recommendations.   Return change in [facility] method.  Caren Macadam, MD, MPH, ABFM Attending [place] for [facility]

## 2019-08-21 LAB — BETA HCG QUANT (REF LAB): hCG Quant: <1 m[IU]/mL

## 2019-10-06 ENCOUNTER — Other Ambulatory Visit: Payer: Self-pay

## 2019-10-06 DIAGNOSIS — Z30011 Encounter for initial prescription of contraceptive pills: Secondary | ICD-10-CM

## 2019-10-06 MED ORDER — NORGESTIMATE-ETH ESTRADIOL 0.25-35 MG-MCG PO TABS: 1.0000 | ORAL_TABLET | Freq: Every day | ORAL | 6 refills | Status: DC

## 2019-10-06 NOTE — Telephone Encounter (Signed)
REFILL ON BIRTH CONTROL SENT INTO PATIENT PHARMACY.

## 2019-10-10 ENCOUNTER — Other Ambulatory Visit: Payer: Self-pay

## 2019-10-10 DIAGNOSIS — Z20822 Contact with and (suspected) exposure to covid-19: Secondary | ICD-10-CM

## 2019-10-12 LAB — NOVEL CORONAVIRUS, NAA: SARS-CoV-2, NAA: NOT DETECTED

## 2019-10-31 NOTE — L&D Delivery Note (Addendum)
Delivery Summary for Hannah Ball  Labor Events:   Preterm labor: No data found  Rupture date: 06/30/2020  Rupture time: 8:29 AM  Rupture type: Spontaneous Intact Possible ROM - for evaluation  Fluid Color: Clear White Brown Bloody  Induction: No data found  Augmentation: No data found  Complications: No data found  Cervical ripening: No data found No data found   No data found     Delivery:   Episiotomy: No data found  Lacerations: No data found  Repair suture: No data found  Repair # of packets: No data found  Blood loss (ml): 750   Information for the patient's newborn:  Mardell, Suttles Girl Karrissa [[id number]]    Delivery 06/30/2020 6:55 PM by  Vaginal, Spontaneous Sex:  female Gestational Age: [redacted]w[redacted]d Delivery Clinician:   Living?:         APGARS  One minute Five minutes Ten minutes  Skin color:        Heart rate:        Grimace:        Muscle tone:        Breathing:        Totals: 8  9      Presentation/position:      Resuscitation:   Cord information:    Disposition of cord blood:     Blood gases sent?  Complications:   Placenta: Delivered:       appearance Newborn Measurements: Weight: 8 lb 12 oz (3970 g)  Height: 20.5"  Head circumference:    Chest circumference:    Other providers:    Additional  information: Forceps:   Vacuum:   Breech:   Observed anomalies       Delivery Note At 6:55 PM a viable and healthy female was delivered via Vaginal, Spontaneous (Presentation: Vertex; Left Occiput Anterior).  APGAR: 8, 9; weight 3970 grams.   Placenta status: Manual removal;Spontaneous, Intact.  Cord: 3 vessels with the following complications: None.  Cord pH: not obtained.  Delayed cord clamping observed. Cord blood collected.  Anesthesia: Epidural Episiotomy: None Lacerations: None Suture Repair: None Est. Blood Loss (mL):  750 ml  Mom to postpartum.  Baby to Couplet care / Skin to Skin.  Hildred Laser, MD 06/30/2020, 9:29 PM

## 2019-11-04 ENCOUNTER — Emergency Department: Payer: Medicaid Other

## 2019-11-04 ENCOUNTER — Other Ambulatory Visit: Payer: Self-pay

## 2019-11-04 ENCOUNTER — Emergency Department
Admission: EM | Admit: 2019-11-04 | Discharge: 2019-11-04 | Disposition: A | Discharge status: Home / Self Care | Payer: Medicaid Other | Attending: Emergency Medicine | Admitting: Emergency Medicine

## 2019-11-04 ENCOUNTER — Encounter: Payer: Self-pay | Admitting: Emergency Medicine

## 2019-11-04 DIAGNOSIS — O26891 Other specified pregnancy related conditions, first trimester: Secondary | ICD-10-CM | POA: Insufficient documentation

## 2019-11-04 DIAGNOSIS — Z3A01 Less than 8 weeks gestation of pregnancy: Secondary | ICD-10-CM | POA: Insufficient documentation

## 2019-11-04 DIAGNOSIS — R102 Pelvic and perineal pain: Secondary | ICD-10-CM | POA: Diagnosis not present

## 2019-11-04 LAB — COMPREHENSIVE METABOLIC PANEL
ALT: 14 U/L (ref 0–44)
AST: 15 U/L (ref 15–41)
Albumin: 4.2 g/dL (ref 3.5–5.0)
Alkaline Phosphatase: 41 U/L (ref 38–126)
Anion gap: 8 (ref 5–15)
BUN: 10 mg/dL (ref 6–20)
CO2: 25 mmol/L (ref 22–32)
Calcium: 9.1 mg/dL (ref 8.9–10.3)
Chloride: 107 mmol/L (ref 98–111)
Creatinine, Ser: 0.72 mg/dL (ref 0.44–1.00)
GFR calc Af Amer: >60 mL/min (ref >60)
GFR calc non Af Amer: >60 mL/min (ref >60)
Glucose, Bld: 78 mg/dL (ref 70–99)
Potassium: 3.6 mmol/L (ref 3.5–5.1)
Sodium: 140 mmol/L (ref 135–145)
Total Bilirubin: 1 mg/dL (ref 0.3–1.2)
Total Protein: 7.5 g/dL (ref 6.5–8.1)

## 2019-11-04 LAB — CBC WITH DIFFERENTIAL/PLATELET
Abs Immature Granulocytes: 0.02 10*3/uL (ref 0.00–0.07)
Basophils Absolute: 0 10*3/uL (ref 0.0–0.1)
Basophils Relative: 0 %
Eosinophils Absolute: 0.1 10*3/uL (ref 0.0–0.5)
Eosinophils Relative: 1 %
HCT: 41.6 % (ref 36.0–46.0)
Hemoglobin: 12.6 g/dL (ref 12.0–15.0)
Immature Granulocytes: 0 %
Lymphocytes Relative: 25 %
Lymphs Abs: 1.6 10*3/uL (ref 0.7–4.0)
MCH: 22.4 pg — ABNORMAL LOW (ref 26.0–34.0)
MCHC: 30.3 g/dL (ref 30.0–36.0)
MCV: 74 fL — ABNORMAL LOW (ref 80.0–100.0)
Monocytes Absolute: 0.5 10*3/uL (ref 0.1–1.0)
Monocytes Relative: 7 %
Neutro Abs: 4.1 10*3/uL (ref 1.7–7.7)
Neutrophils Relative %: 67 %
Platelets: 191 10*3/uL (ref 150–400)
RBC: 5.62 MIL/uL — ABNORMAL HIGH (ref 3.87–5.11)
RDW: 16.1 % — ABNORMAL HIGH (ref 11.5–15.5)
WBC: 6.2 10*3/uL (ref 4.0–10.5)
nRBC: 0 % (ref 0.0–0.2)

## 2019-11-04 LAB — LIPASE, BLOOD: Lipase: 30 U/L (ref 11–51)

## 2019-11-04 LAB — WET PREP, GENITAL
Clue Cells Wet Prep HPF POC: NONE SEEN
Sperm: NONE SEEN
Trich, Wet Prep: NONE SEEN
Yeast Wet Prep HPF POC: NONE SEEN

## 2019-11-04 LAB — URINALYSIS, ROUTINE W REFLEX MICROSCOPIC
Bacteria, UA: NONE SEEN
Bilirubin Urine: NEGATIVE
Glucose, UA: NEGATIVE mg/dL
Ketones, ur: NEGATIVE mg/dL
Nitrite: NEGATIVE
Protein, ur: NEGATIVE mg/dL
Specific Gravity, Urine: 1.016 (ref 1.005–1.030)
pH: 6 (ref 5.0–8.0)

## 2019-11-04 LAB — PREGNANCY, URINE: Preg Test, Ur: POSITIVE — AB

## 2019-11-04 LAB — HCG, QUANTITATIVE, PREGNANCY: hCG, Beta Chain, Quant, S: 9150 m[IU]/mL — ABNORMAL HIGH (ref <5)

## 2019-11-04 LAB — ABO/RH: ABO/RH(D): A NEG

## 2019-11-04 LAB — ANTIBODY SCREEN: Antibody Screen: NEGATIVE

## 2019-11-04 MED ORDER — RHO D IMMUNE GLOBULIN 1500 UNIT/2ML IJ SOSY
300.0000 ug | PREFILLED_SYRINGE | Freq: Once | INTRAMUSCULAR | Status: AC
  Administered 2019-11-04: 20:00:00 300 ug via INTRAMUSCULAR
  Filled 2019-11-04: qty 2

## 2019-11-04 NOTE — ED Provider Notes (Signed)
[facility] Emergency Department Provider Note  ____________________________________________   First MD Initiated Contact with Patient 11/04/19 1458     (approximate)  I have reviewed the triage vital signs and the nursing notes.   HISTORY  Chief Complaint Abdominal Pain    HPI Hannah Ball is a 29 y.o. female who is currently approximately 6-week pregnant who comes in with abdominal cramping.  Patient states she had one episode of abdominal pain that was mid lower abdomen lasted an hour and then went away.  She did have some continued discomfort today that is very minimal at this time, better with Tylenol, nothing makes it worse.  She states that she had sex yesterday.  This morning she noticed some blood in which she thinks is her urine.  This is now her fourth pregnancy and says that she is never had this kind of discomfort before and so she wanted to get things checked out.  She denies the pain being focal to one side of her abdomen.  Denies any chest pain, shortness of breath.  She declines work-up for STDs, denies any concern for these          Past Medical History:  Diagnosis Date  . Nausea/vomiting in pregnancy 10/26/2016   Trial of diclegis   . Ovarian cyst   . Type A blood, Rh negative     Patient Active Problem List   Diagnosis Date Noted  . Rubella non-immune status, antepartum 08/14/2017  . Rh negative, antepartum 10/16/2016    Past Surgical History:  Procedure Laterality Date  . LAPAROSCOPIC OVARIAN CYSTECTOMY    . OVARIAN CYST REMOVAL  2008    Prior to Admission medications   Medication Sig Start Date End Date Taking? Authorizing Provider  acetaminophen (TYLENOL) 325 MG tablet Take 650 mg by mouth every 6 (six) hours as needed for headache.    [provider]  hydrocortisone (ANUSOL-HC) 2.5 % rectal cream Place 1 application rectally 3 (three) times daily. Patient not taking: Reported on 08/06/2019 04/19/18    Emily Filbert, MD  ibuprofen (ADVIL,MOTRIN) 600 MG tablet Take 1 tablet (600 mg total) by mouth every 6 (six) hours. Patient not taking: Reported on 08/06/2019 04/15/18   Bonnita Hollow, MD  norgestimate-ethinyl estradiol (ORTHO-CYCLEN) 0.25-35 MG-MCG tablet Take 1 tablet by mouth daily. 10/06/19   Osborne Oman, MD    Allergies Benadryl [diphenhydramine hcl (sleep)], Latex, Neosporin plus max st, Neosporin wound cleanser [benzalkonium chloride], and Penicillins  Family History  Problem Relation Age of Onset  . Diabetes Maternal Grandfather     Social History Social History   Tobacco Use  . Smoking status: Never Smoker  . Smokeless tobacco: Never Used  Substance Use Topics  . Alcohol use: No  . Drug use: No      Review of Systems Constitutional: No fever/chills Eyes: No visual changes. ENT: No sore throat. Cardiovascular: Denies chest pain. Respiratory: Denies shortness of breath. Gastrointestinal: Positive abdominal pain Genitourinary: Negative for dysuria.  Positive blood in urine Musculoskeletal: Negative for back pain. Skin: Negative for rash. Neurological: Negative for headaches, focal weakness or numbness. All other ROS negative ____________________________________________   PHYSICAL EXAM:  VITAL SIGNS: ED Triage Vitals  Enc Vitals Group     BP 11/04/19 1503 110/61     Pulse Rate 11/04/19 1503 83     Resp 11/04/19 1503 16     Temp 11/04/19 1503 98.3 F (36.8 C)     Temp Source 11/04/19  1503 Oral     SpO2 11/04/19 1503 100 %     Weight 11/04/19 1505 175 lb (79.4 kg)     Height 11/04/19 1505 5\' 9"  (1.753 m)     Head Circumference --      Peak Flow --      Pain Score 11/04/19 1505 8     Pain Loc --      Pain Edu? --      Excl. in GC? --     Constitutional: Alert and oriented. Well appearing and in no acute distress. Eyes: Conjunctivae are normal. EOMI. Head: Atraumatic. Nose: No congestion/rhinnorhea. Mouth/Throat: Mucous membranes are moist.    Neck: No stridor. Trachea Midline. FROM Cardiovascular: Normal rate, regular rhythm. Grossly normal heart sounds.  Good peripheral circulation. Respiratory: Normal respiratory effort.  No retractions. Lungs CTAB. Gastrointestinal: Soft and nontender. No distention. No abdominal bruits.  Musculoskeletal: No lower extremity tenderness nor edema.  No joint effusions. Neurologic:  Normal speech and language. No gross focal neurologic deficits are appreciated.  Skin:  Skin is warm, dry and intact. No rash noted. Psychiatric: Mood and affect are normal. Speech and behavior are normal. GU: mild discharge, no CMT. Cervix closed.    ____________________________________________   LABS (all labs ordered are listed, but only abnormal results are displayed)  Labs Reviewed  WET PREP, GENITAL - Abnormal; Notable for the following components:      Result Value   WBC, Wet Prep HPF POC MANY (*)    All other components within normal limits  CBC WITH DIFFERENTIAL/PLATELET - Abnormal; Notable for the following components:   RBC 5.62 (*)    MCV 74.0 (*)    MCH 22.4 (*)    RDW 16.1 (*)    All other components within normal limits  HCG, QUANTITATIVE, PREGNANCY - Abnormal; Notable for the following components:   hCG, Beta Chain, Quant, S 9,150 (*)    All other components within normal limits  URINALYSIS, ROUTINE W REFLEX MICROSCOPIC - Abnormal; Notable for the following components:   Color, Urine YELLOW (*)    APPearance CLEAR (*)    Hgb urine dipstick SMALL (*)    Leukocytes,Ua MODERATE (*)    All other components within normal limits  PREGNANCY, URINE - Abnormal; Notable for the following components:   Preg Test, Ur POSITIVE (*)    All other components within normal limits  COMPREHENSIVE METABOLIC PANEL  LIPASE, BLOOD  ABO/RH  ANTIBODY SCREEN  RHOGAM INJECTION   ____________________________________________ RADIOLOGY   Official radiology report(s): 01/02/20 OB Comp Less 14 Wks  Result Date:  11/04/2019 CLINICAL DATA:  Pelvic pain for 1 day with positive pregnancy test EXAM: OBSTETRIC <14 WK ULTRASOUND TECHNIQUE: Transabdominal ultrasound was performed for evaluation of the gestation as well as the maternal uterus and adnexal regions. COMPARISON:  None. FINDINGS: Intrauterine gestational sac: Present Yolk sac:  Absent Embryo:  Absent MSD:  6.2 mm mm   5 w   2 d Subchorionic hemorrhage:  None visualized. Maternal uterus/adnexae: Small cystic lesion within the right ovary is noted which may represent a corpus luteum cyst. 2 cm isoechoic structure is noted in the anterior aspect of the uterine wall likely representing a small fibroid. IMPRESSION: Probable early intrauterine gestational sac, but no yolk sac, fetal pole, or cardiac activity yet visualized. Recommend follow-up quantitative B-HCG levels and follow-up 01/02/2020 in 14 days to assess viability. This recommendation follows SRU consensus guidelines: Diagnostic Criteria for Nonviable Pregnancy Early in the First Trimester. N Engl  J Med 2013; 250:5397-67. Electronically Signed   By: Alcide Clever M.D.   On: 11/04/2019 17:24    ____________________________________________   PROCEDURES  Procedure(s) performed (including Critical Care):  Procedures   ____________________________________________   INITIAL IMPRESSION / ASSESSMENT AND PLAN / ED COURSE  Hannah Ball was evaluated in Emergency Department on 11/04/2019 for the symptoms described in the history of present illness. She was evaluated in the context of the global COVID-19 pandemic, which necessitated consideration that the patient might be at risk for infection with the SARS-CoV-2 virus that causes COVID-19. Institutional protocols and algorithms that pertain to the evaluation of patients at risk for COVID-19 are in a state of rapid change based on information released by regulatory bodies including the CDC and federal and state organizations. These policies and algorithms were  followed during the patient's care in the ED.    Patient is a well-appearing 29 year old who presents with some lower abdominal discomfort.  Abdomen is soft and nontender.  Low suspicion for appendicitis, ovarian torsion given the description of her concerns.  Will get hCG and to evaluate for ectopic pregnancy versus miscarriage verses pregnancy.    hCG was 9000.  Ultrasound showed probable early intrauterine gestational sac.  Discussed with OB/GYN Dr. Jean Rosenthal who recommends a follow-up ultrasound in 14 days to assess viability.  Low suspicion for ectopic given there is an sac seen on ultrasound.  Patient had declined the transvaginal part he does not think that it will be necessary.  Patient is Rh- given unclear if she had vaginal bleeding versus some blood in her urine will give a dose of RhoGam.  Patient's pelvic exam showed scant discharge but no vaginal bleeding at this time.  Cervix was closed.  Patient declined testing for gonorrhea and chlamydia since she just had that done a month ago.  UA with no bacteria or WBCs will send off for urine culture to ensure there is no evidence of UTI given she is pregnant.  I discussed the provisional nature of ED diagnosis, the treatment so far, the ongoing plan of care, follow up appointments and return precautions with the patient and any family or support people present. They expressed understanding and agreed with the plan, discharged home.  ____________________________________________   FINAL CLINICAL IMPRESSION(S) / ED DIAGNOSES   Final diagnoses:  Less than [redacted] weeks gestation of pregnancy      MEDICATIONS GIVEN DURING THIS VISIT:  Medications  rho (d) immune globulin (RHIG/RHOPHYLAC) injection 300 mcg (has no administration in time range)     ED Discharge Orders    None       Note:  This document was prepared using Dragon voice recognition software and may include unintentional dictation errors.   Concha Se, MD 11/04/19 [id number]

## 2019-11-04 NOTE — ED Triage Notes (Signed)
Pt arrival from home from personal car due to abdominal pain and blood in urine. Pt states there wasn't a whole lot of blood in the urine, but enough for her to notice.   Pt in NAD and has no further complaints at this time.  Dr. Fuller Plan at bedside.

## 2019-11-04 NOTE — Discharge Instructions (Addendum)
This is most likely an early pregnancy. Call your OB to get [place] in 2 weeks. Return to ER for any other concerns.    IMPRESSION: Probable early intrauterine gestational sac, but no yolk sac, fetal pole, or cardiac activity yet visualized. Recommend follow-up quantitative B-HCG levels and follow-up US in 14 days to assess viability. This recommendation follows SRU consensus guidelines: Diagnostic Criteria for Nonviable Pregnancy Early in the First Trimester. Malva Limes Med 2013; 718:5501-58.

## 2019-11-05 LAB — RHOGAM INJECTION: Unit division: 0

## 2019-11-07 LAB — URINE CULTURE: Culture: 50000 — AB

## 2019-11-13 ENCOUNTER — Ambulatory Visit (INDEPENDENT_AMBULATORY_CARE_PROVIDER_SITE_OTHER): Payer: Medicaid Other | Admitting: Obstetrics & Gynecology

## 2019-11-13 ENCOUNTER — Encounter: Payer: Self-pay | Admitting: Obstetrics & Gynecology

## 2019-11-13 ENCOUNTER — Other Ambulatory Visit: Payer: Self-pay

## 2019-11-13 VITALS — BP 109/70 | HR 70 | Wt 204.8 lb

## 2019-11-13 DIAGNOSIS — Z3A01 Less than 8 weeks gestation of pregnancy: Secondary | ICD-10-CM

## 2019-11-13 DIAGNOSIS — Z331 Pregnant state, incidental: Secondary | ICD-10-CM

## 2019-11-13 DIAGNOSIS — O3680X0 Pregnancy with inconclusive fetal viability, not applicable or unspecified: Secondary | ICD-10-CM

## 2019-11-13 DIAGNOSIS — Z362 Encounter for other antenatal screening follow-up: Secondary | ICD-10-CM

## 2019-11-13 NOTE — Progress Notes (Signed)
GYNECOLOGY OFFICE VISIT NOTE  History:   Hannah Ball is a 29 y.o. [phone] here today for follow up after ED visit on 11/04/19. She was seen for pain in early pregnancy; she is [redacted]w[redacted]d by LMP. HCG was 9000 and ultrasound showed probable early intrauterine gestational sac. She is supposed to have follow up viability scan in about 14 days after that scan, but is here today because she is anxious.  She denies any abnormal vaginal discharge, bleeding, pelvic pain or other concerns.    Past Medical History:  Diagnosis Date  . Nausea/vomiting in pregnancy 10/26/2016   Trial of diclegis   . Ovarian cyst   . Type A blood, Rh negative     Past Surgical History:  Procedure Laterality Date  . LAPAROSCOPIC OVARIAN CYSTECTOMY    . OVARIAN CYST REMOVAL  2008   The following portions of the patient's history were reviewed and updated as appropriate: allergies, current medications, past family history, past medical history, past social history, past surgical history and problem list.   Health Maintenance:  Normal pap and positive HRHPV (negative 16, 18/45) on 08/06/2019.   Review of Systems:  Pertinent items noted in HPI and remainder of comprehensive ROS otherwise negative.  Physical Exam:  BP 109/70   Pulse 70   Wt 204 lb 12.8 oz (92.9 kg)   LMP 09/29/2019   BMI 30.24 kg/m  CONSTITUTIONAL: Well-developed, well-nourished female in no acute distress.  HEENT:  Normocephalic, atraumatic. External right and left ear normal. No scleral icterus.  NEUROLOGIC: Alert and oriented to person, place, and time. Normal muscle tone coordination. No cranial nerve deficit noted. PSYCHIATRIC: Normal mood and affect. Normal behavior. Normal judgment and thought content. CARDIOVASCULAR: Normal heart rate noted RESPIRATORY: Effort and breath sounds normal, no problems with respiration noted ABDOMEN: No masses noted. No other overt distention noted.   PELVIC: Normal appearing external genitalia; normal  appearing distal vaginal mucosa .  No abnormal discharge noted.  Normal uterine size, no other palpable masses, no uterine or adnexal tenderness.  Labs and Imaging  Bedside transvaginal scan: Viable IUP around [redacted] weeks gestation, FHR 150s.    US OB Comp Less 14 Wks  Result Date: 11/04/2019 CLINICAL DATA:  Pelvic pain for 1 day with positive pregnancy test EXAM: OBSTETRIC <14 WK ULTRASOUND TECHNIQUE: Transabdominal ultrasound was performed for evaluation of the gestation as well as the maternal uterus and adnexal regions. COMPARISON:  None. FINDINGS: Intrauterine gestational sac: Present Yolk sac:  Absent Embryo:  Absent MSD:  6.2 mm mm   5 w   2 d Subchorionic hemorrhage:  None visualized. Maternal uterus/adnexae: Small cystic lesion within the right ovary is noted which may represent a corpus luteum cyst. 2 cm isoechoic structure is noted in the anterior aspect of the uterine wall likely representing a small fibroid. IMPRESSION: Probable early intrauterine gestational sac, but no yolk sac, fetal pole, or cardiac activity yet visualized. Recommend follow-up quantitative B-HCG levels and follow-up US in 14 days to assess viability. This recommendation follows SRU consensus guidelines: Diagnostic Criteria for Nonviable Pregnancy Early in the First Trimester. Malva Limes Med 2013; 536:1443-15. Electronically Signed   By: Alcide Clever M.D.   On: 11/04/2019 17:24      Assessment and Plan:     1. Incidental pregnancy confirmed IUP confirmed.  She was told to start taking prenatal vitamins. Will have NOB visit in 2-3 weeks. Routine preventative health maintenance measures emphasized. Please refer to After Visit Summary for  other counseling recommendations.   Return for any obstetric concerns.    Total face-to-face time with patient: 15 minutes.  Over 50% of encounter was spent on counseling and coordination of care.   Verita Schneiders, MD, [place] for  [employer], [place]

## 2019-12-05 ENCOUNTER — Other Ambulatory Visit: Payer: Self-pay

## 2019-12-05 ENCOUNTER — Emergency Department
Admission: EM | Admit: 2019-12-05 | Discharge: 2019-12-05 | Disposition: A | Discharge status: Home / Self Care | Payer: Medicaid Other | Attending: Emergency Medicine | Admitting: Emergency Medicine

## 2019-12-05 ENCOUNTER — Encounter: Payer: Self-pay | Admitting: Emergency Medicine

## 2019-12-05 ENCOUNTER — Emergency Department: Payer: Medicaid Other

## 2019-12-05 DIAGNOSIS — O98511 Other viral diseases complicating pregnancy, first trimester: Secondary | ICD-10-CM | POA: Diagnosis not present

## 2019-12-05 DIAGNOSIS — R509 Fever, unspecified: Secondary | ICD-10-CM | POA: Insufficient documentation

## 2019-12-05 DIAGNOSIS — R438 Other disturbances of smell and taste: Secondary | ICD-10-CM | POA: Diagnosis not present

## 2019-12-05 DIAGNOSIS — R519 Headache, unspecified: Secondary | ICD-10-CM | POA: Diagnosis not present

## 2019-12-05 DIAGNOSIS — Z3A10 10 weeks gestation of pregnancy: Secondary | ICD-10-CM | POA: Diagnosis not present

## 2019-12-05 DIAGNOSIS — Z91040 Latex allergy status: Secondary | ICD-10-CM | POA: Insufficient documentation

## 2019-12-05 DIAGNOSIS — N3090 Cystitis, unspecified without hematuria: Secondary | ICD-10-CM | POA: Diagnosis not present

## 2019-12-05 DIAGNOSIS — J069 Acute upper respiratory infection, unspecified: Secondary | ICD-10-CM | POA: Diagnosis not present

## 2019-12-05 DIAGNOSIS — R0981 Nasal congestion: Secondary | ICD-10-CM | POA: Insufficient documentation

## 2019-12-05 DIAGNOSIS — U071 COVID-19: Secondary | ICD-10-CM | POA: Insufficient documentation

## 2019-12-05 LAB — CBC WITH DIFFERENTIAL/PLATELET
Abs Immature Granulocytes: 0.02 10*3/uL (ref 0.00–0.07)
Basophils Absolute: 0 10*3/uL (ref 0.0–0.1)
Basophils Relative: 0 %
Eosinophils Absolute: 0 10*3/uL (ref 0.0–0.5)
Eosinophils Relative: 1 %
HCT: 42.3 % (ref 36.0–46.0)
Hemoglobin: 13.2 g/dL (ref 12.0–15.0)
Immature Granulocytes: 0 %
Lymphocytes Relative: 24 %
Lymphs Abs: 1.6 10*3/uL (ref 0.7–4.0)
MCH: 23.2 pg — ABNORMAL LOW (ref 26.0–34.0)
MCHC: 31.2 g/dL (ref 30.0–36.0)
MCV: 74.5 fL — ABNORMAL LOW (ref 80.0–100.0)
Monocytes Absolute: 0.4 10*3/uL (ref 0.1–1.0)
Monocytes Relative: 6 %
Neutro Abs: 4.4 10*3/uL (ref 1.7–7.7)
Neutrophils Relative %: 69 %
Platelets: 170 10*3/uL (ref 150–400)
RBC: 5.68 MIL/uL — ABNORMAL HIGH (ref 3.87–5.11)
RDW: 16.5 % — ABNORMAL HIGH (ref 11.5–15.5)
WBC: 6.4 10*3/uL (ref 4.0–10.5)
nRBC: 0 % (ref 0.0–0.2)

## 2019-12-05 LAB — URINALYSIS, COMPLETE (UACMP) WITH MICROSCOPIC
Bilirubin Urine: NEGATIVE
Glucose, UA: NEGATIVE mg/dL
Hgb urine dipstick: NEGATIVE
Ketones, ur: 5 mg/dL — AB
Nitrite: NEGATIVE
Protein, ur: 30 mg/dL — AB
Specific Gravity, Urine: 1.024 (ref 1.005–1.030)
pH: 5 (ref 5.0–8.0)

## 2019-12-05 LAB — COMPREHENSIVE METABOLIC PANEL
ALT: 20 U/L (ref 0–44)
AST: 17 U/L (ref 15–41)
Albumin: 4.1 g/dL (ref 3.5–5.0)
Alkaline Phosphatase: 37 U/L — ABNORMAL LOW (ref 38–126)
Anion gap: 7 (ref 5–15)
BUN: 7 mg/dL (ref 6–20)
CO2: 24 mmol/L (ref 22–32)
Calcium: 8.8 mg/dL — ABNORMAL LOW (ref 8.9–10.3)
Chloride: 105 mmol/L (ref 98–111)
Creatinine, Ser: 0.54 mg/dL (ref 0.44–1.00)
GFR calc Af Amer: >60 mL/min (ref >60)
GFR calc non Af Amer: >60 mL/min (ref >60)
Glucose, Bld: 81 mg/dL (ref 70–99)
Potassium: 3.6 mmol/L (ref 3.5–5.1)
Sodium: 136 mmol/L (ref 135–145)
Total Bilirubin: 0.9 mg/dL (ref 0.3–1.2)
Total Protein: 7.2 g/dL (ref 6.5–8.1)

## 2019-12-05 LAB — HCG, QUANTITATIVE, PREGNANCY: hCG, Beta Chain, Quant, S: 251955 m[IU]/mL — ABNORMAL HIGH (ref <5)

## 2019-12-05 LAB — LIPASE, BLOOD: Lipase: 25 U/L (ref 11–51)

## 2019-12-05 LAB — INFLUENZA PANEL BY PCR (TYPE A & B)
Influenza A By PCR: NEGATIVE
Influenza B By PCR: NEGATIVE

## 2019-12-05 MED ORDER — CEPHALEXIN 500 MG PO CAPS
500.0000 mg | ORAL_CAPSULE | Freq: Once | ORAL | Status: AC
  Administered 2019-12-05: 500 mg via ORAL
  Filled 2019-12-05: qty 1

## 2019-12-05 MED ORDER — CEPHALEXIN 500 MG PO CAPS: 500.0000 mg | ORAL_CAPSULE | Freq: Three times a day (TID) | ORAL | 0 refills | Status: AC

## 2019-12-05 NOTE — ED Notes (Signed)
Reviewed discharge instructions, follow-up care, and prescriptions with patient. Patient verbalized understanding of all information reviewed. Patient stable, with no distress noted at this time.    

## 2019-12-05 NOTE — Discharge Instructions (Signed)
Your lab work is reassuring.  Your influenza test is negative.  Your Covid test is pending.  Your urine shows some bacteria and some white blood cells.  I given you antibiotics for a urinary tract infection.  Your ultrasound shows a large [redacted] weeks pregnant.  Please return emergency department over the weekend for any worsening of symptoms.  Please call OB on Monday for a follow-up appointment early next week.

## 2019-12-05 NOTE — ED Provider Notes (Signed)
[facility] Emergency Department Provider Note  ____________________________________________  Time seen: Approximately 7:36 PM  I have reviewed the triage vital signs and the nursing notes.   HISTORY  Chief Complaint Generalized Body Aches    HPI Hannah Ball is a 29 y.o. female that is [redacted] weeks pregnant that presents to the emergency department for evaluation of low-grade fever, headache, nasal congestion, loss of taste and smell, body aches for 1 week.  Patient was tested for Covid last week at a [employer].  Test was negative but she states that maybe she did not insert the swab far enough since she did it herself.  Patient had some minor lower abdominal discomfort last night but states that it was not bad and nothing that concerned her to bring her to the emergency department. No abdominal pain now. No vaginal bleeding or vaginal discharge. No urinary symptoms.  Patient states that since she is still spiking a low-grade fever she week later, she wanted to get checked out.  Patient was exposed to Covid about 3 weeks ago.  Patient is supposed to have OB ultrasound on Wednesday.  She already has established care with OB.  No cough, shortness of breath, chest pain, vomiting, vaginal bleeding, vaginal discharge.   Past Medical History:  Diagnosis Date  . Nausea/vomiting in pregnancy 10/26/2016   Trial of diclegis   . Ovarian cyst   . Type A blood, Rh negative     Patient Active Problem List   Diagnosis Date Noted  . Rubella non-immune status, antepartum 08/14/2017  . Rh negative, antepartum 10/16/2016    Past Surgical History:  Procedure Laterality Date  . LAPAROSCOPIC OVARIAN CYSTECTOMY    . OVARIAN CYST REMOVAL  2008    Prior to Admission medications   Medication Sig Start Date End Date Taking? Authorizing Provider  acetaminophen (TYLENOL) 325 MG tablet Take 650 mg by mouth every 6 (six) hours as needed for headache.    [provider]  hydrocortisone (ANUSOL-HC) 2.5 % rectal cream Place 1 application rectally 3 (three) times daily. Patient not taking: Reported on 08/06/2019 04/19/18   Emily Filbert, MD  ibuprofen (ADVIL,MOTRIN) 600 MG tablet Take 1 tablet (600 mg total) by mouth every 6 (six) hours. Patient not taking: Reported on 08/06/2019 04/15/18   Bonnita Hollow, MD  norgestimate-ethinyl estradiol (ORTHO-CYCLEN) 0.25-35 MG-MCG tablet Take 1 tablet by mouth daily. Patient not taking: Reported on 11/13/2019 10/06/19   Osborne Oman, MD    Allergies Benadryl [diphenhydramine hcl (sleep)], Latex, Neosporin plus max st, Neosporin wound cleanser [benzalkonium chloride], and Penicillins  Family History  Problem Relation Age of Onset  . Diabetes Maternal Grandfather     Social History Social History   Tobacco Use  . Smoking status: Never Smoker  . Smokeless tobacco: Never Used  Substance Use Topics  . Alcohol use: No  . Drug use: No     Review of Systems  Constitutional: Positive for low grade fever. Eyes: No visual changes. No discharge. ENT: Positive for congestion and rhinorrhea. Cardiovascular: No chest pain. Respiratory: Negative for cough. No SOB. Gastrointestinal: No abdominal pain.  No nausea, no vomiting.  No diarrhea.  No constipation. Musculoskeletal: Positive for body aches. Skin: Negative for rash, abrasions, lacerations, ecchymosis. Neurological: Positive for headaches.   ____________________________________________   PHYSICAL EXAM:  VITAL SIGNS: ED Triage Vitals [12/05/19 1809]  Enc Vitals Group     BP 106/66     Pulse Rate 94  Resp 18     Temp 98.2 F (36.8 C)     Temp Source Oral     SpO2 97 %     Weight      Height      Head Circumference      Peak Flow      Pain Score      Pain Loc      Pain Edu?      Excl. in GC?      Constitutional: Alert and oriented. Well appearing and in no acute distress. Eyes: Conjunctivae are normal. PERRL. EOMI. No  discharge. Head: Atraumatic. ENT: No frontal and maxillary sinus tenderness.      Ears: Tympanic membranes pearly gray with good landmarks. No discharge.      Nose: Mild congestion/rhinnorhea.      Mouth/Throat: Mucous membranes are moist. Oropharynx non-erythematous. Tonsils not enlarged. No exudates. Uvula midline. Neck: No stridor.   Hematological/Lymphatic/Immunilogical: No cervical lymphadenopathy. Cardiovascular: Normal rate, regular rhythm.  Good peripheral circulation. Respiratory: Normal respiratory effort without tachypnea or retractions. Lungs CTAB. Good air entry to the bases with no decreased or absent breath sounds. Gastrointestinal: Bowel sounds 4 quadrants. Soft and nontender to palpation. No guarding or rigidity. No palpable masses. No distention. Musculoskeletal: Full range of motion to all extremities. No gross deformities appreciated. Neurologic:  Normal speech and language. No gross focal neurologic deficits are appreciated.  Skin:  Skin is warm, dry and intact. No rash noted. Psychiatric: Mood and affect are normal. Speech and behavior are normal. Patient exhibits appropriate insight and judgement.   ____________________________________________   LABS (all labs ordered are listed, but only abnormal results are displayed)  Labs Reviewed  CBC WITH DIFFERENTIAL/PLATELET - Abnormal; Notable for the following components:      Result Value   RBC 5.68 (*)    MCV 74.5 (*)    MCH 23.2 (*)    RDW 16.5 (*)    All other components within normal limits  COMPREHENSIVE METABOLIC PANEL - Abnormal; Notable for the following components:   Calcium 8.8 (*)    Alkaline Phosphatase 37 (*)    All other components within normal limits  URINALYSIS, COMPLETE (UACMP) WITH MICROSCOPIC - Abnormal; Notable for the following components:   Color, Urine YELLOW (*)    APPearance HAZY (*)    Ketones, ur 5 (*)    Protein, ur 30 (*)    Leukocytes,Ua LARGE (*)    Bacteria, UA RARE (*)     All other components within normal limits  HCG, QUANTITATIVE, PREGNANCY - Abnormal; Notable for the following components:   hCG, Beta Chain, Quant, S 251,955 (*)    All other components within normal limits  SARS CORONAVIRUS 2 (TAT 6-24 HRS)  URINE CULTURE  LIPASE, BLOOD  INFLUENZA PANEL BY PCR (TYPE A & B)   ____________________________________________  EKG   ____________________________________________  RADIOLOGY   US OB Comp Less 14 Wks  Result Date: 12/05/2019 CLINICAL DATA:  Abdominal pain, G4P3, quantitative beta hCG = 355732 EXAM: OBSTETRIC <14 WK ULTRASOUND TECHNIQUE: Transabdominal ultrasound was performed for evaluation of the gestation as well as the maternal uterus and adnexal regions. COMPARISON:  Ultrasound 11/04/2019 FINDINGS: LMP: 09/29/2019 GA by LMP: 9 w  4 d EDC by LMP: 07/05/2020 Intrauterine gestational sac: Single Yolk sac:  Visualized Embryo:  Visualized Cardiac Activity: Visualized Heart Rate: 163 bpm CRL:   31 mm   10 w 0 d  [place] EDC: 07/02/2020 Subchorionic hemorrhage:  None visualized. Maternal uterus/adnexae: A previously seen fibroid in the anterior uterine wall is not well visualized on today's examination. Normal appearance of the ovaries. No free fluid in the pelvis. IMPRESSION: Single viable intrauterine gestation at an estimated gestational age of [redacted] weeks 0 days by crown-rump length measurement. Electronically Signed   By: Kreg Shropshire M.D.   On: 12/05/2019 23:09    ____________________________________________    PROCEDURES  Procedure(s) performed:    Procedures    Medications  cephALEXin (KEFLEX) capsule 500 mg (has no administration in time range)     ____________________________________________   INITIAL IMPRESSION / ASSESSMENT AND PLAN / ED COURSE  Pertinent labs & imaging results that were available during my care of the patient were reviewed by me and considered in my medical decision making (see chart for  details).  Review of the [place] CSRS was performed in accordance of the [facility] prior to dispensing any controlled drugs.  Patient's diagnosis is consistent with viral URI, cystitis in first trimester pregnancy.  Vital signs and exam are reassuring.  Lab work was largely unremarkable and results were discussed with the patient.  Influenza test was negative.  Covid test is pending.  Urinalysis shows leukocytes and rare bacteria.  Urine will be sent for culture.  Ultrasound consistent with single intrauterine pregnancy at [redacted] weeks gestation with a heartbeat of 161 bpm.  Patient appears well and is staying well hydrated.  Patient feels comfortable going home. Patient will be discharged home with prescriptions for Keflex.  Patient is allergic to penicillin but was on Keflex for urinary tract infection about 1 year ago.  Patient is to follow up with OB as needed or otherwise directed. Patient is given ED precautions to return to the ED for any worsening or new symptoms.  ONYA EUTSLER was evaluated in Emergency Department on 12/05/2019 for the symptoms described in the history of present illness. She was evaluated in the context of the global COVID-19 pandemic, which necessitated consideration that the patient might be at risk for infection with the SARS-CoV-2 virus that causes COVID-19. Institutional protocols and algorithms that pertain to the evaluation of patients at risk for COVID-19 are in a state of rapid change based on information released by regulatory bodies including the CDC and federal and state organizations. These policies and algorithms were followed during the patient's care in the ED.   ____________________________________________  FINAL CLINICAL IMPRESSION(S) / ED DIAGNOSES  Final diagnoses:  Cystitis  [redacted] weeks gestation of pregnancy  Viral URI      NEW MEDICATIONS STARTED DURING THIS VISIT:  ED Discharge Orders    None          This chart was dictated using voice  recognition software/Dragon. Despite best efforts to proofread, errors can occur which can change the meaning. Any change was purely unintentional.    Enid Derry, PA-C 12/06/19 0001    Chesley Noon, MD 12/06/19 Jacinta Shoe

## 2019-12-05 NOTE — ED Triage Notes (Signed)
Presents with body aches ,fever and loss of taste    Also possible fever

## 2019-12-05 NOTE — ED Notes (Signed)
See triage note. Pt exposed to Covid positive in-laws approx 3 weeks ago. Pt states that she and husband have been experiencing fever and lethargy for the last week and a half. Pt denies coughing but has experienced loss of taste and smell. Pt is currently [redacted] weeks pregnant. She was tested for Covid on 29 January with negative results.

## 2019-12-06 LAB — SARS CORONAVIRUS 2 (TAT 6-24 HRS): SARS Coronavirus 2: POSITIVE — AB

## 2019-12-07 ENCOUNTER — Encounter: Payer: Self-pay | Admitting: Medical

## 2019-12-07 DIAGNOSIS — U071 COVID-19: Secondary | ICD-10-CM | POA: Insufficient documentation

## 2019-12-07 HISTORY — DX: COVID-19: U07.1

## 2019-12-07 LAB — URINE CULTURE

## 2019-12-08 ENCOUNTER — Telehealth: Payer: Self-pay | Admitting: Emergency Medicine

## 2019-12-08 NOTE — Telephone Encounter (Signed)
Called to assure patient is aware of covid positive.  She is aware.

## 2019-12-10 ENCOUNTER — Encounter: Payer: Medicaid Other | Admitting: Family Medicine

## 2019-12-10 ENCOUNTER — Telehealth: Payer: Self-pay

## 2019-12-10 ENCOUNTER — Telehealth: Payer: Self-pay | Admitting: Radiology

## 2019-12-10 NOTE — Telephone Encounter (Signed)
Patient cancelled appointments, transferring [facility] to [facility].

## 2019-12-11 NOTE — Telephone Encounter (Signed)
error 

## 2019-12-17 ENCOUNTER — Encounter: Payer: Medicaid Other | Admitting: Advanced Practice Midwife

## 2019-12-18 ENCOUNTER — Telehealth: Payer: Self-pay | Admitting: Certified Nurse Midwife

## 2019-12-18 NOTE — Telephone Encounter (Signed)
Called pt and told her that for her next visit its up to her if she likes annie she can stay with our midwifes but is wants to do the MD we can do that. I also told the pt she would be going back a forward seeing doctor evans and cherry. Pt was fine with anything she was just wanting to leave [facility]

## 2019-12-18 NOTE — Telephone Encounter (Signed)
-----   Message from Silvano Bilis, LPN sent at 8/88/2800 12:54 PM EST ----- Regarding: appointment [place], Do patient know that she will not only be see Dr. Valentino Saxon but Dr. Logan Bores as well? If patient is okay with seeing both Dr. Valentino Saxon and Dr. Logan Bores then will you please ask Crystal if pt can be transferred after her NOB PE with AT on 12/22/19 since [facility] do not have any openings. Thanks Geraldo Pitter ----- Message ----- From: Susanne Greenhouse Sent: 12/09/2019   1:41 PM EST To: Silvano Bilis, LPN  The pt is transferring from [facility]. This pt was told dr Valentino Saxon was amazing and that she wants to see her this pt is 10 weeks she needs her new ob pe. Where can we put her ?  Please let me know

## 2019-12-22 ENCOUNTER — Encounter: Payer: Self-pay | Admitting: Certified Nurse Midwife

## 2019-12-22 ENCOUNTER — Other Ambulatory Visit: Payer: Self-pay

## 2019-12-22 ENCOUNTER — Ambulatory Visit (INDEPENDENT_AMBULATORY_CARE_PROVIDER_SITE_OTHER): Payer: Medicaid Other | Admitting: Certified Nurse Midwife

## 2019-12-22 VITALS — BP 97/65 | HR 90 | Wt 204.3 lb

## 2019-12-22 DIAGNOSIS — Z3481 Encounter for supervision of other normal pregnancy, first trimester: Secondary | ICD-10-CM

## 2019-12-22 MED ORDER — BUTALBITAL-APAP-CAFFEINE 50-325-40 MG PO TABS: 1.0000 | ORAL_TABLET | Freq: Four times a day (QID) | ORAL | 0 refills | Status: DC | PRN

## 2019-12-22 MED ORDER — DOXYLAMINE-PYRIDOXINE 10-10 MG PO TBEC: 1.0000 | DELAYED_RELEASE_TABLET | Freq: Four times a day (QID) | ORAL | 5 refills | Status: DC

## 2019-12-22 NOTE — Progress Notes (Signed)
NEW OB HISTORY AND PHYSICAL  SUBJECTIVE:       Hannah Ball is a 29 y.o. [phone] female, Patient's last menstrual period was 09/29/2019., Estimated Date of Delivery: 07/05/20, [redacted]w[redacted]d, presents today for establishment of Prenatal Care. She has no unusual complaints and complains of nausea       Gynecologic History Patient's last menstrual period was 09/29/2019. Normal Contraception: none Last Pap:08/06/19 Results were: normal  Obstetric History OB History  Gravida Para Term Preterm AB Living  4 3 3  0 0 3  SAB TAB Ectopic Multiple Live Births  0 0 0 0 3    # Outcome Date GA Lbr Len/2nd Weight Sex Delivery Anes PTL Lv  4 Current           3 Term 04/13/18 [redacted]w[redacted]d 138:05 / 00:05 8 lb 5.7 oz (3.79 kg) F Vag-Spont EPI  LIV  2 Term 05/09/17 [redacted]w[redacted]d 11:49 / 02:13 10 lb 12.1 oz (4.88 kg) M Vag-Vacuum EPI  LIV     Birth Comments: WNL  1 Term 2013   7 lb 13 oz (3.544 kg) M Vag-Spont EPI  LIV     Complications: Ovarian cyst during pregnancy    Past Medical History:  Diagnosis Date  . Nausea/vomiting in pregnancy 10/26/2016   Trial of diclegis   . Ovarian cyst   . Type A blood, Rh negative     Past Surgical History:  Procedure Laterality Date  . LAPAROSCOPIC OVARIAN CYSTECTOMY    . OVARIAN CYST REMOVAL  2008    Current Outpatient Medications on File Prior to Visit  Medication Sig Dispense Refill  . acetaminophen (TYLENOL) 325 MG tablet Take 650 mg by mouth every 6 (six) hours as needed for headache.    . hydrocortisone (ANUSOL-HC) 2.5 % rectal cream Place 1 application rectally 3 (three) times daily. (Patient not taking: Reported on 08/06/2019) 30 g 6  . ibuprofen (ADVIL,MOTRIN) 600 MG tablet Take 1 tablet (600 mg total) by mouth every 6 (six) hours. (Patient not taking: Reported on 08/06/2019) 30 tablet 0  . norgestimate-ethinyl estradiol (ORTHO-CYCLEN) 0.25-35 MG-MCG tablet Take 1 tablet by mouth daily. (Patient not taking: Reported on 11/13/2019) 1 Package 6   No current  facility-administered medications on file prior to visit.    Allergies  Allergen Reactions  . Benadryl [Diphenhydramine Hcl (Sleep)] Hives  . Latex Hives  . Neosporin Plus Max St Other (See Comments)    Skin burns  . Neosporin Wound Cleanser [Benzalkonium Chloride] Hives  . Penicillins Rash    Has patient had a PCN reaction causing immediate rash, facial/tongue/throat swelling, SOB or lightheadedness with hypotension: Yes Has patient had a PCN reaction causing severe rash involving mucus membranes or skin necrosis: No Has patient had a PCN reaction that required hospitalization No Has patient had a PCN reaction occurring within the last 10 years: No If all of the above answers are "NO", then may proceed with Cephalosporin use.     Social History   Socioeconomic History  . Marital status: Married    Spouse name: Not on file  . Number of children: Not on file  . Years of education: Not on file  . Highest education level: Not on file  Occupational History  . Not on file  Tobacco Use  . Smoking status: Never Smoker  . Smokeless tobacco: Never Used  Substance and Sexual Activity  . Alcohol use: No  . Drug use: No  . Sexual activity: Yes    Partners: Male  Birth control/protection: None    Comment: last intercourse was 06/16/17-unprotected  Other Topics Concern  . Not on file  Social History Narrative   ** Merged History Encounter **       Social Determinants of Health   Financial Resource Strain:   . Difficulty of Paying Living Expenses: Not on file  Food Insecurity:   . Worried About Programme researcher, broadcasting/film/video in the Last Year: Not on file  . Ran Out of Food in the Last Year: Not on file  Transportation Needs:   . Lack of Transportation (Medical): Not on file  . Lack of Transportation (Non-Medical): Not on file  Physical Activity:   . Days of Exercise per Week: Not on file  . Minutes of Exercise per Session: Not on file  Stress:   . Feeling of Stress : Not on file   Social Connections:   . Frequency of Communication with Friends and Family: Not on file  . Frequency of Social Gatherings with Friends and Family: Not on file  . Attends Religious Services: Not on file  . Active Member of Clubs or Organizations: Not on file  . Attends Banker Meetings: Not on file  . Marital Status: Not on file  Intimate Partner Violence:   . Fear of Current or Ex-Partner: Not on file  . Emotionally Abused: Not on file  . Physically Abused: Not on file  . Sexually Abused: Not on file    Family History  Problem Relation Age of Onset  . Diabetes Maternal Grandfather     The following portions of the patient's history were reviewed and updated as appropriate: allergies, current medications, past OB history, past medical history, past surgical history, past family history, past social history, and problem list.    OBJECTIVE: Initial Physical Exam (New OB)  GENERAL APPEARANCE: alert, well appearing, in no apparent distress, oriented to person, place and time HEAD: normocephalic, atraumatic MOUTH: mucous membranes moist, pharynx normal without lesions THYROID: no thyromegaly or masses present BREASTS: no masses noted, no significant tenderness, no palpable axillary nodes, no skin changes LUNGS: clear to auscultation, no wheezes, rales or rhonchi, symmetric air entry HEART: regular rate and rhythm, no murmurs ABDOMEN: soft, nontender, nondistended, no abnormal masses, no epigastric pain, FHT present EXTREMITIES: no redness or tenderness in the calves or thighs SKIN: normal coloration and turgor, no rashes LYMPH NODES: no adenopathy palpable NEUROLOGIC: alert, oriented, normal speech, no focal findings or movement disorder noted  PELVIC EXAM EXTERNAL GENITALIA: normal appearing vulva with no masses, tenderness or lesions VAGINA: no abnormal discharge or lesions CERVIX: no lesions or cervical motion tenderness UTERUS: gravid ADNEXA: no masses palpable  and nontender OB EXAM PELVIMETRY: appears adequate RECTUM: exam not indicated  ASSESSMENT: Normal pregnancy  PLAN: New OB counseling: The patient has been given an overview regarding routine prenatal care. Recommendations regarding diet, weight gain, and exercise in pregnancy were given. Prenatal testing, optional genetic testing, carrier screening test, and ultrasound use in pregnancy were reviewed. Panorama testing completed today. Order placed for diclegis, pt state she took with last pregnancy and she did not have an allergic reaction to it. Pt has history of migraines asking about alternative to tylenol that is stronger. Order placed for Fioricet. Discussed risks of use in pregnancy. She verbalizes understanding.  Benefits of Breast Feeding were discussed. The patient is encouraged to consider nursing her baby post partum.  Doreene Burke, CNM

## 2019-12-22 NOTE — Progress Notes (Signed)
Deleted note, patient is only 12 wks and will wait to do Ready, Set, Baby education

## 2019-12-22 NOTE — Patient Instructions (Signed)

## 2019-12-23 ENCOUNTER — Telehealth: Payer: Self-pay | Admitting: Certified Nurse Midwife

## 2019-12-23 LAB — MONITOR DRUG PROFILE 14(MW)
Amphetamine Scrn, Ur: NEGATIVE ng/mL
BARBITURATE SCREEN URINE: NEGATIVE ng/mL
BENZODIAZEPINE SCREEN, URINE: NEGATIVE ng/mL
Buprenorphine, Urine: NEGATIVE ng/mL
CANNABINOIDS UR QL SCN: NEGATIVE ng/mL
Cocaine (Metab) Scrn, Ur: NEGATIVE ng/mL
Creatinine(Crt), U: 286.9 mg/dL (ref 20.0–300.0)
Fentanyl, Urine: NEGATIVE pg/mL
Meperidine Screen, Urine: NEGATIVE ng/mL
Methadone Screen, Urine: NEGATIVE ng/mL
OXYCODONE+OXYMORPHONE UR QL SCN: NEGATIVE ng/mL
Opiate Scrn, Ur: NEGATIVE ng/mL
Ph of Urine: 5.9 (ref 4.5–8.9)
Phencyclidine Qn, Ur: NEGATIVE ng/mL
Propoxyphene Scrn, Ur: NEGATIVE ng/mL
SPECIFIC GRAVITY: 1.02
Tramadol Screen, Urine: NEGATIVE ng/mL

## 2019-12-23 LAB — URINALYSIS, ROUTINE W REFLEX MICROSCOPIC
Bilirubin, UA: NEGATIVE
Glucose, UA: NEGATIVE
Ketones, UA: NEGATIVE
Nitrite, UA: NEGATIVE
RBC, UA: NEGATIVE
Specific Gravity, UA: 1.024 (ref 1.005–1.030)
Urobilinogen, Ur: 1 mg/dL (ref 0.2–1.0)
pH, UA: 6 (ref 5.0–7.5)

## 2019-12-23 LAB — MICROSCOPIC EXAMINATION: Casts: NONE SEEN /lpf

## 2019-12-23 LAB — GC/CHLAMYDIA PROBE AMP
Chlamydia trachomatis, NAA: NEGATIVE
Neisseria Gonorrhoeae by PCR: NEGATIVE

## 2019-12-23 NOTE — Telephone Encounter (Signed)
Called and spoke with patient.  Advised her to let us know when we call her with Panorama results to tell who ever calls that she will like a MyChart message and a print out for her mother in law to pick up.  Patient verbalized understanding.

## 2019-12-23 NOTE — Telephone Encounter (Signed)
Pt called in and stated that she would like for her genetic testing to be sent to her my chart as well as it printed for her mother in law to pick up. I told her that it will take 2 weeks and that she will be notified.

## 2019-12-24 LAB — URINE CULTURE: Organism ID, Bacteria: NO GROWTH

## 2019-12-24 LAB — RUBELLA SCREEN: Rubella Antibodies, IGG: 2.77 index (ref >0.99)

## 2019-12-24 LAB — ANTIBODY SCREEN

## 2019-12-24 LAB — CBC
Hematocrit: 39.8 % (ref 34.0–46.6)
Hemoglobin: 13 g/dL (ref 11.1–15.9)
MCH: 24 pg — ABNORMAL LOW (ref 26.6–33.0)
MCHC: 32.7 g/dL (ref 31.5–35.7)
MCV: 73 fL — ABNORMAL LOW (ref 79–97)
Platelets: 165 10*3/uL (ref 150–450)
RBC: 5.42 x10E6/uL — ABNORMAL HIGH (ref 3.77–5.28)
RDW: 16.7 % — ABNORMAL HIGH (ref 11.7–15.4)
WBC: 6.1 10*3/uL (ref 3.4–10.8)

## 2019-12-24 LAB — VARICELLA ZOSTER ANTIBODY, IGG: Varicella zoster IgG: 1289 index (ref >165)

## 2019-12-24 LAB — AB SCR+ANTIBODY ID: Antibody Screen: POSITIVE — AB

## 2019-12-24 LAB — RPR: RPR Ser Ql: NONREACTIVE

## 2019-12-24 LAB — HIV ANTIBODY (ROUTINE TESTING W REFLEX): HIV Screen 4th Generation wRfx: NONREACTIVE

## 2019-12-24 LAB — HEPATITIS B SURFACE ANTIGEN: Hepatitis B Surface Ag: NEGATIVE

## 2019-12-25 NOTE — Progress Notes (Signed)
Ok. Thanks  I will follow up with this.  That is strange.

## 2019-12-30 ENCOUNTER — Telehealth: Payer: Self-pay

## 2019-12-30 ENCOUNTER — Telehealth: Payer: Self-pay | Admitting: Certified Nurse Midwife

## 2019-12-30 NOTE — Telephone Encounter (Signed)
Pt called in  and stated that she was giving meds one is for headaches and one is for nausea. The meds where never sent in to the pharamcy and the pt is asking about them. [facility] [address] please advise

## 2019-12-30 NOTE — Telephone Encounter (Signed)
Mychart message sent to patient- both meds were sent 12/22/19 and [facility] confirmed receipt at 10:50 and 10:53.

## 2019-12-30 NOTE — Telephone Encounter (Signed)
Pt called in and stated that she is waiting for you to message her back about the gender. The pt doesn't want anyone to know. But her and the mother in law. The pt is requesting a call or message back on mychart. Please advise

## 2020-01-07 ENCOUNTER — Telehealth: Payer: Medicaid Other | Admitting: Family Medicine

## 2020-01-17 ENCOUNTER — Emergency Department
Admission: EM | Admit: 2020-01-17 | Discharge: 2020-01-17 | Disposition: A | Discharge status: Home / Self Care | Payer: Medicaid Other | Attending: Student in an Organized Health Care Education/Training Program | Admitting: Student in an Organized Health Care Education/Training Program

## 2020-01-17 ENCOUNTER — Encounter: Payer: Self-pay | Admitting: Emergency Medicine

## 2020-01-17 ENCOUNTER — Emergency Department (HOSPITAL_COMMUNITY)
Admission: EM | Admit: 2020-01-17 | Discharge: 2020-01-17 | Disposition: A | Discharge status: Home / Self Care | Payer: Medicaid Other | Attending: Emergency Medicine | Admitting: Emergency Medicine

## 2020-01-17 ENCOUNTER — Encounter (HOSPITAL_COMMUNITY): Payer: Self-pay | Admitting: Emergency Medicine

## 2020-01-17 ENCOUNTER — Other Ambulatory Visit: Payer: Self-pay

## 2020-01-17 DIAGNOSIS — K0889 Other specified disorders of teeth and supporting structures: Secondary | ICD-10-CM | POA: Diagnosis not present

## 2020-01-17 DIAGNOSIS — Z3A12 12 weeks gestation of pregnancy: Secondary | ICD-10-CM | POA: Diagnosis not present

## 2020-01-17 DIAGNOSIS — Z91040 Latex allergy status: Secondary | ICD-10-CM | POA: Insufficient documentation

## 2020-01-17 DIAGNOSIS — O99891 Other specified diseases and conditions complicating pregnancy: Secondary | ICD-10-CM | POA: Insufficient documentation

## 2020-01-17 DIAGNOSIS — Z79899 Other long term (current) drug therapy: Secondary | ICD-10-CM | POA: Insufficient documentation

## 2020-01-17 DIAGNOSIS — Z8616 Personal history of COVID-19: Secondary | ICD-10-CM | POA: Insufficient documentation

## 2020-01-17 MED ORDER — CLINDAMYCIN HCL 150 MG PO CAPS
300.0000 mg | ORAL_CAPSULE | Freq: Once | ORAL | Status: AC
  Administered 2020-01-17: 300 mg via ORAL
  Filled 2020-01-17: qty 2

## 2020-01-17 MED ORDER — CLINDAMYCIN HCL 300 MG PO CAPS: 300.0000 mg | ORAL_CAPSULE | Freq: Three times a day (TID) | ORAL | 0 refills | Status: AC

## 2020-01-17 NOTE — ED Notes (Signed)
See triage note- pt has erupted wisdom tooth that has been causing her throbbing jaw pain. Pain has been off and on for a couple of months and intensified at midnight.

## 2020-01-17 NOTE — Discharge Instructions (Signed)
OPTIONS FOR DENTAL FOLLOW UP CARE ° °[address] [facility] Health and Human Services - Local Safety Net Dental Clinics °http://www.ncdhhs.gov/dph/oralhealth/services/safetynetclinics.htm °  °[facility] (336-562-3123) ° °[facility] [place] (919-933-9087) ° °[facility] (919-663-1744 ext [phone]) ° °[facility] ([phone]) ° °[facility] ([phone]) °This clinic caters to the indigent population and is on a lottery system. °Location: °[facility], [address], [place] °Clinic Hours: °Wednesdays from 6pm - 9pm, patients seen by a lottery system. °For dates, call or go to www.med.[facility].edu/[facility]/patients/Dental-[facility] °Services: °Cleanings, fillings and simple extractions. °Payment Options: °DENTAL WORK IS FREE OF CHARGE. Bring proof of income or support. °Best way to get seen: °Arrive at 5:15 pm - this is a lottery, NOT first come/first serve, so arriving earlier will not increase your chances of being seen. °  °  °[facility] °[phone] °Select option 1 for emergencies °  °Location: °[facility], [address], [place] °Clinic Hours: °No walk-ins accepted - call the day before to schedule an appointment. °Check in times are 9:30 am and 1:30 pm. °Services: °Simple extractions, temporary fillings, pulpectomy/pulp debridement, uncomplicated abscess drainage. °Payment Options: °PAYMENT IS DUE AT THE TIME OF SERVICE.  Fee is usually $100-200, additional surgical procedures (e.g. abscess drainage) may be extra. °Cash, checks, Visa/MasterCard accepted.  Can file Medicaid if patient is covered for dental - patient should call case worker to check. °No discount for [facility] patients. °Best way to get seen: °MUST call the day before and get onto the schedule. Can usually be seen the next 1-2 days. No walk-ins accepted. °  °  °[facility] °[phone] °   °Location: °[facility], [address], [place] °Clinic Hours: °M, W, Th, F 8am or 1:30pm, Tues 9a or 1:30 - first come/first served. °Services: °Simple extractions, temporary fillings, uncomplicated abscess drainage.  You do not need to be an [place] resident. °Payment Options: °PAYMENT IS DUE AT THE TIME OF SERVICE. °Dental insurance, otherwise sliding scale - bring proof of income or support. °Depending on income and treatment needed, cost is usually $50-200. °Best way to get seen: °Arrive early as it is first come/first served. °  °  °[facility] °[phone] °  °Location: °[address] °Clinic Hours: °Mon-Thu 8a-5p °Services: °Most basic dental services including extractions and fillings. °Payment Options: °PAYMENT IS DUE AT THE TIME OF SERVICE. °Sliding scale, up to 50% off - bring proof if income or support. °Medicaid with dental option accepted. °Best way to get seen: °Call to schedule an appointment, can usually be seen within 2 weeks OR they will try to see walk-ins - show up at 8a or 2p (you may have to wait). °  °  °[facility] °[phone] °[place] RESIDENTS ONLY °  °Location: °[facility], [address], [place], [place] [address] °Clinic Hours: By appointment only. °Monday - Thursday 8am-5pm, Friday 8am-12pm °Services: Cleanings, fillings, extractions. °Payment Options: °PAYMENT IS DUE AT THE TIME OF SERVICE. °Cash, Visa or MasterCard. Sliding scale - $30 minimum per service. °Best way to get seen: °Come in to office, complete packet and make an appointment - need proof of income °or support monies for each household member and proof of [place] residence. °Usually takes about a month to get in. °  °  °[facility] °[phone] °  °Location: °[address]   [place] °Clinic Hours: Walk-in Urgent Care Dental Services are offered Monday-Friday  mornings only. °The numbers of emergencies accepted daily is limited to the number of °providers available. °Maximum 15 - Mondays, Wednesdays & Thursdays °Maximum 10 - Tuesdays & Fridays °Services: °You do not need to be a  [place] resident to be seen for a dental emergency. °Emergencies are defined as pain, swelling, abnormal bleeding, or dental trauma. Walkins will receive x-rays if needed. °NOTE: Dental cleaning is not an emergency. °Payment Options: °PAYMENT IS DUE AT THE TIME OF SERVICE. °Minimum co-pay is $40.00 for uninsured patients. °Minimum co-pay is $3.00 for Medicaid with dental coverage. °Dental Insurance is accepted and must be presented at time of visit. °Medicare does not cover dental. °Forms of payment: Cash, credit card, checks. °Best way to get seen: °If not previously registered with the clinic, walk-in dental registration begins at 7:15 am and is on a first come/first serve basis. °If previously registered with the clinic, call to make an appointment. °  °  °[facility] °[phone] °[place] RESIDENTS ONLY °  °Location: °[address], [place], [place] °Clinic Hours: °Mon-Thu 10a-2p °Services: Extractions only! °Payment Options: °FREE (donations accepted) - bring proof of income or support °Best way to get seen: °Call and schedule an appointment OR come at 8am on the 1st Monday of every month (except for holidays) when it is first come/first served. °  °  °[facility] °[phone] °  °Location: °[address], [place] °Clinic Hours: °Friday mornings °Services, Payment Options, Best way to get seen: °Call for info °

## 2020-01-17 NOTE — ED Triage Notes (Signed)
Patient reports right upper/lower dental pain for 2 months unrelieved by OTC pain medications , she is 4 months pregnant .

## 2020-01-17 NOTE — Discharge Instructions (Signed)
Please continue taking antibiotic as previously prescribed and call and follow-up closely with your dentist for further management.

## 2020-01-17 NOTE — ED Notes (Signed)
Patient verbalizes understanding of discharge instructions. Opportunity for questioning and answers were provided. Armband removed by staff, pt discharged from ED.  

## 2020-01-17 NOTE — ED Notes (Signed)
Signature pad not working- Pt verbalizes discharged instructions and has no questions at this time.

## 2020-01-17 NOTE — ED Triage Notes (Signed)
Pt ambulatory to triage with c/o right lower dental pain that has been intermittent for last several months, but in last 24 hours has become significantly worse and noted swelling.  Pt also reports 4 months pregnant.

## 2020-01-17 NOTE — ED Provider Notes (Signed)
[facility] EMERGENCY DEPARTMENT Provider Note   CSN: [id number] Arrival date & time: 01/17/20  2120     History Chief Complaint  Patient presents with  . Dental Pain    Hannah Ball is a 29 y.o. female.  The history is provided by the patient and medical records. No language interpreter was used.  Dental Pain Associated symptoms: no fever      29 year old female who is currently [redacted] weeks pregnant presenting for evaluation of dental pain.  Patient report for the past 2 months she has had intermittent dental pain.  It started initially on her right upper molar but now affecting her right lower molar.  Pain is been waxing waning improved with over-the-counter medication at home however pain intensified yesterday and has persisted throughout the day today.  Pain is sharp shooting throbbing, nothing seems to make it better or worse.  She tries Tylenol, heat, ice, Orajel without relief.  No associated fever chills no trouble swallowing no neck pain no ear pain.  She was seen at an outside hospital today for her complaints.  States they assessed her pregnancy and reported normal heart tone.  Patient was giving clindamycin as treatment.  She took the medication but report no improvement thus prompting this ER visit.  She is planning on seeing a dentist in 2 days.  Past Medical History:  Diagnosis Date  . Migraines   . Nausea/vomiting in pregnancy 10/26/2016   Trial of diclegis   . Ovarian cyst   . Type A blood, Rh negative     Patient Active Problem List   Diagnosis Date Noted  . COVID-19 12/07/2019  . Rubella non-immune status, antepartum 08/14/2017  . Rh negative, antepartum 10/16/2016    Past Surgical History:  Procedure Laterality Date  . LAPAROSCOPIC OVARIAN CYSTECTOMY    . OVARIAN CYST REMOVAL  2008     OB History    Gravida  4   Para  3   Term  3   Preterm  0   AB  0   Living  3     SAB  0   TAB  0   Ectopic  0   Multiple    0   Live Births  3           Family History  Problem Relation Age of Onset  . Diabetes Maternal Grandfather     Social History   Tobacco Use  . Smoking status: Never Smoker  . Smokeless tobacco: Never Used  Substance Use Topics  . Alcohol use: No  . Drug use: No    Home Medications Prior to Admission medications   Medication Sig Start Date End Date Taking? Authorizing Provider  acetaminophen (TYLENOL) 325 MG tablet Take 650 mg by mouth every 6 (six) hours as needed for headache.    [provider]  butalbital-acetaminophen-caffeine (FIORICET) [phone] MG tablet Take 1-2 tablets by mouth every 6 (six) hours as needed for headache. 12/22/19 12/21/20  Philip Aspen, CNM  clindamycin (CLEOCIN) 300 MG capsule Take 1 capsule (300 mg total) by mouth 3 (three) times daily for 10 days. 01/17/20 01/27/20  Lannie Fields, PA-C  Doxylamine-Pyridoxine 10-10 MG TBEC Take 1 tablet by mouth 4 (four) times daily. Day 1 &2: 2 tablet at bedtimeDay 3 : if symptoms persists 1 tablet am; 2 tablet at bedtimeDay 4: 1 tablet am, 1 tab afternoon, 2 tab at bedtime 12/22/19   Philip Aspen, CNM  hydrocortisone (ANUSOL-HC) 2.5 %  rectal cream Place 1 application rectally 3 (three) times daily. Patient not taking: Reported on 08/06/2019 04/19/18   Allie Bossier, MD  ibuprofen (ADVIL,MOTRIN) 600 MG tablet Take 1 tablet (600 mg total) by mouth every 6 (six) hours. Patient not taking: Reported on 08/06/2019 04/15/18   Garnette Gunner, MD  norgestimate-ethinyl estradiol (ORTHO-CYCLEN) 0.25-35 MG-MCG tablet Take 1 tablet by mouth daily. Patient not taking: Reported on 11/13/2019 10/06/19   Tereso Newcomer, MD    Allergies    Benadryl [diphenhydramine hcl (sleep)], Latex, Neosporin plus max st, Neosporin wound cleanser [benzalkonium chloride], and Penicillins  Review of Systems   Review of Systems  Constitutional: Negative for fever.  HENT: Positive for dental problem.     Physical Exam Updated  Vital Signs BP 108/62 (BP Location: Left Arm)   Pulse 91   Temp 98.7 F (37.1 C) (Oral)   Ht 5\' 9"  (1.753 m)   Wt 90 kg   LMP 09/29/2019   SpO2 100%   BMI 29.30 kg/m   Physical Exam Vitals and nursing note reviewed.  Constitutional:      General: She is not in acute distress.    Appearance: She is well-developed.  HENT:     Head: Atraumatic.     Mouth/Throat:     Comments: Tenderness to tooth #32 without significant dental decal.  No abscess noted. No trismus. Eyes:     Conjunctiva/sclera: Conjunctivae normal.  Musculoskeletal:     Cervical back: Neck supple.  Skin:    Findings: No rash.  Neurological:     Mental Status: She is alert.     ED Results / Procedures / Treatments   Labs (all labs ordered are listed, but only abnormal results are displayed) Labs Reviewed - No data to display  EKG None  Radiology No results found.  Procedures .Nerve Block  Date/Time: 01/17/2020 10:06 PM Performed by: 01/19/2020, PA-C Authorized by: Fayrene Helper, PA-C   Consent:    Consent obtained:  Verbal   Consent given by:  Patient   Risks discussed:  Pain, nerve damage and unsuccessful block   Alternatives discussed:  No treatment Indications:    Indications:  Pain relief Location:    Body area:  Head   Head nerve blocked: mouth: R inferior alveolar block.   Laterality:  Right Skin anesthesia (see MAR for exact dosages):    Skin anesthesia method:  None Procedure details (see MAR for exact dosages):    Block needle gauge:  27 G   Anesthetic injected:  Bupivacaine 0.25% WITH epi   Steroid injected:  None   Injection procedure:  Anatomic landmarks identified and introduced needle   Paresthesia:  Immediately resolved Post-procedure details:    Dressing:  None   Outcome:  Anesthesia achieved   Patient tolerance of procedure:  Tolerated well, no immediate complications   (including critical care time)  Medications Ordered in ED Medications - No data to display  ED  Course  I have reviewed the triage vital signs and the nursing notes.  Pertinent labs & imaging results that were available during my care of the patient were reviewed by me and considered in my medical decision making (see chart for details).    MDM Rules/Calculators/A&P                      BP 108/62 (BP Location: Left Arm)   Pulse 91   Temp 98.7 F (37.1 C) (Oral)   Ht 5\' 9"  (  1.753 m)   Wt 90 kg   LMP 09/29/2019   SpO2 100%   BMI 29.30 kg/m   Final Clinical Impression(s) / ED Diagnoses Final diagnoses:  Pain, dental    Rx / DC Orders ED Discharge Orders    None     10:07 PM Patient here with dental pain.  Was seen at an outside hospital today for same.  Was prescribed clindamycin.  She still endorse pain.  Patient amenable for dental block.  I perform an inferior alveolar nerve block to the right side with immediate improvement of pain.  Encourage patient to continue with antibiotic and follow-up with dentist for further care.   Fayrene Helper, PA-C 01/17/20 1540    Pricilla Loveless, MD 01/18/20 [phone]

## 2020-01-17 NOTE — ED Provider Notes (Signed)
Emergency Department Provider Note  ____________________________________________  Time seen: Approximately 4:26 PM  I have reviewed the triage vital signs and the nursing notes.   HISTORY  Chief Complaint Dental Pain   Historian Patient     HPI Hannah Ball is a 29 y.o. female presents to the emergency department with inferior 32 pain.  Patient is approximately [redacted] weeks pregnant and has been taking Tylenol at home for pain which is not relieving her symptoms.  She has noticed some mild swelling along the right lower jaw.  She has not made an appointment with a local dentist yet.  No fever or chills at home.  No other alleviating measures have been attempted.   Past Medical History:  Diagnosis Date  . Migraines   . Nausea/vomiting in pregnancy 10/26/2016   Trial of diclegis   . Ovarian cyst   . Type A blood, Rh negative      Immunizations up to date:  Yes.     Past Medical History:  Diagnosis Date  . Migraines   . Nausea/vomiting in pregnancy 10/26/2016   Trial of diclegis   . Ovarian cyst   . Type A blood, Rh negative     Patient Active Problem List   Diagnosis Date Noted  . COVID-19 12/07/2019  . Rubella non-immune status, antepartum 08/14/2017  . Rh negative, antepartum 10/16/2016    Past Surgical History:  Procedure Laterality Date  . LAPAROSCOPIC OVARIAN CYSTECTOMY    . OVARIAN CYST REMOVAL  2008    Prior to Admission medications   Medication Sig Start Date End Date Taking? Authorizing Provider  acetaminophen (TYLENOL) 325 MG tablet Take 650 mg by mouth every 6 (six) hours as needed for headache.    [provider]  butalbital-acetaminophen-caffeine (FIORICET) [phone] MG tablet Take 1-2 tablets by mouth every 6 (six) hours as needed for headache. 12/22/19 12/21/20  Philip Aspen, CNM  Doxylamine-Pyridoxine 10-10 MG TBEC Take 1 tablet by mouth 4 (four) times daily. Day 1 &2: 2 tablet at bedtimeDay 3 : if symptoms persists 1  tablet am; 2 tablet at bedtimeDay 4: 1 tablet am, 1 tab afternoon, 2 tab at bedtime 12/22/19   Philip Aspen, CNM  hydrocortisone (ANUSOL-HC) 2.5 % rectal cream Place 1 application rectally 3 (three) times daily. Patient not taking: Reported on 08/06/2019 04/19/18   Emily Filbert, MD  ibuprofen (ADVIL,MOTRIN) 600 MG tablet Take 1 tablet (600 mg total) by mouth every 6 (six) hours. Patient not taking: Reported on 08/06/2019 04/15/18   Bonnita Hollow, MD  norgestimate-ethinyl estradiol (ORTHO-CYCLEN) 0.25-35 MG-MCG tablet Take 1 tablet by mouth daily. Patient not taking: Reported on 11/13/2019 10/06/19   Osborne Oman, MD    Allergies Benadryl [diphenhydramine hcl (sleep)], Latex, Neosporin plus max st, Neosporin wound cleanser [benzalkonium chloride], and Penicillins  Family History  Problem Relation Age of Onset  . Diabetes Maternal Grandfather     Social History Social History   Tobacco Use  . Smoking status: Never Smoker  . Smokeless tobacco: Never Used  Substance Use Topics  . Alcohol use: No  . Drug use: No     Review of Systems  Constitutional: No fever/chills Eyes:  No discharge ENT: Patient has Inferior 32 pain.  Respiratory: no cough. No SOB/ use of accessory muscles to breath Gastrointestinal:   No nausea, no vomiting.  No diarrhea.  No constipation. Musculoskeletal: Negative for musculoskeletal pain. Skin: Negative for rash, abrasions, lacerations, ecchymosis.    ____________________________________________   PHYSICAL EXAM:  VITAL SIGNS: ED Triage Vitals  Enc Vitals Group     BP 01/17/20 1407 107/66     Pulse Rate 01/17/20 1407 79     Resp 01/17/20 1407 18     Temp 01/17/20 1407 99.2 F (37.3 C)     Temp Source 01/17/20 1407 Oral     SpO2 01/17/20 1407 100 %     Weight 01/17/20 1408 190 lb (86.2 kg)     Height 01/17/20 1408 5\' 9"  (1.753 m)     Head Circumference --      Peak Flow --      Pain Score 01/17/20 1407 10     Pain Loc --      Pain Edu?  --      Excl. in GC? --      Constitutional: Alert and oriented. Well appearing and in no acute distress. Eyes: Conjunctivae are normal. PERRL. EOMI. Head: Atraumatic. ENT:      Ears: TMs are pearly.       Nose: No congestion/rhinnorhea.      Mouth/Throat: Mucous membranes are moist.  Inferior 32 is not affected by dental caries.  Patient has exceptionally healthy dentition.  No pain underneath the tongue.  No swelling of the neck. Neck: No stridor.  No cervical spine tenderness to palpation. Cardiovascular: Normal rate, regular rhythm. Normal S1 and S2.  Good peripheral circulation. Respiratory: Normal respiratory effort without tachypnea or retractions. Lungs CTAB. Good air entry to the bases with no decreased or absent breath sounds Gastrointestinal: Bowel sounds x 4 quadrants. Soft and nontender to palpation. No guarding or rigidity. No distention. Musculoskeletal: Full range of motion to all extremities. No obvious deformities noted Neurologic:  Normal for age. No gross focal neurologic deficits are appreciated.  Skin:  Skin is warm, dry and intact. No rash noted. Psychiatric: Mood and affect are normal for age. Speech and behavior are normal.   ____________________________________________   LABS (all labs ordered are listed, but only abnormal results are displayed)  Labs Reviewed - No data to display ____________________________________________  EKG   ____________________________________________  RADIOLOGY  No results found.  ____________________________________________    PROCEDURES  Procedure(s) performed:     Procedures     Medications  clindamycin (CLEOCIN) capsule 300 mg (has no administration in time range)     ____________________________________________   INITIAL IMPRESSION / ASSESSMENT AND PLAN / ED COURSE  Pertinent labs & imaging results that were available during my care of the patient were reviewed by me and considered in my medical  decision making (see chart for details).      Assessment and Plan:  Inferior 92 Pain: 29 year old female presents to the emergency department with inferior 32 pain.  Patient had low-grade fever in triage.  On physical exam, she had some mild swelling of the right lower jaw.  There was no drainable fluid collection appreciated on physical exam.  I did a bedside fetal ultrasound and fetal heart tones were detected at 155 bpm.  Patient denied any abdominal pain, vaginal bleeding or abdominal discomfort.  I recommended patient continuing Tylenol at home for pain and patient was also discharged with clindamycin.  Return precautions were given to return with new or worsening symptoms.  She was advised to make an appointment to local dentist as soon as possible.   ____________________________________________  FINAL CLINICAL IMPRESSION(S) / ED DIAGNOSES  Final diagnoses:  Pain, dental      NEW MEDICATIONS STARTED DURING THIS VISIT:  ED Discharge Orders  None          This chart was dictated using voice recognition software/Dragon. Despite best efforts to proofread, errors can occur which can change the meaning. Any change was purely unintentional.     Orvil Feil, PA-C 01/17/20 1634    Willy Eddy, MD 01/17/20 [phone]

## 2020-01-19 ENCOUNTER — Telehealth: Payer: Self-pay | Admitting: Obstetrics and Gynecology

## 2020-01-19 NOTE — Patient Instructions (Signed)
Second Trimester of Pregnancy  The second trimester is from week 14 through week 27 (month 4 through 6). This is often the time in pregnancy that you feel your best. Often times, morning sickness has lessened or quit. You may have more energy, and you may get hungry more often. Your unborn baby is growing rapidly. At the end of the sixth month, he or she is about 9 inches long and weighs about 1 pounds. You will likely feel the baby move between 18 and 20 weeks of pregnancy. Follow these instructions at home: Medicines  Take over-the-counter and prescription medicines only as told by your doctor. Some medicines are safe and some medicines are not safe during pregnancy.  Take a prenatal vitamin that contains at least 600 micrograms (mcg) of folic acid.  If you have trouble pooping (constipation), take medicine that will make your stool soft (stool softener) if your doctor approves. Eating and drinking   Eat regular, healthy meals.  Avoid raw meat and uncooked cheese.  If you get low calcium from the food you eat, talk to your doctor about taking a daily calcium supplement.  Avoid foods that are high in fat and sugars, such as fried and sweet foods.  If you feel sick to your stomach (nauseous) or throw up (vomit): ? Eat 4 or 5 small meals a day instead of 3 large meals. ? Try eating a few soda crackers. ? Drink liquids between meals instead of during meals.  To prevent constipation: ? Eat foods that are high in fiber, like fresh fruits and vegetables, whole grains, and beans. ? Drink enough fluids to keep your pee (urine) clear or pale yellow. Activity  Exercise only as told by your doctor. Stop exercising if you start to have cramps.  Do not exercise if it is too hot, too humid, or if you are in a place of great height (high altitude).  Avoid heavy lifting.  Wear low-heeled shoes. Sit and stand up straight.  You can continue to have sex unless your doctor tells you not  to. Relieving pain and discomfort  Wear a good support bra if your breasts are tender.  Take warm water baths (sitz baths) to soothe pain or discomfort caused by hemorrhoids. Use hemorrhoid cream if your doctor approves.  Rest with your legs raised if you have leg cramps or low back pain.  If you develop puffy, bulging veins (varicose veins) in your legs: ? Wear support hose or compression stockings as told by your doctor. ? Raise (elevate) your feet for 15 minutes, 3-4 times a day. ? Limit salt in your food. Prenatal care  Write down your questions. Take them to your prenatal visits.  Keep all your prenatal visits as told by your doctor. This is important. Safety  Wear your seat belt when driving.  Make a list of emergency phone numbers, including numbers for family, friends, the hospital, and police and fire departments. General instructions  Ask your doctor about the right foods to eat or for help finding a counselor, if you need these services.  Ask your doctor about local prenatal classes. Begin classes before month 6 of your pregnancy.  Do not use hot tubs, steam rooms, or saunas.  Do not douche or use tampons or scented sanitary pads.  Do not cross your legs for long periods of time.  Visit your dentist if you have not done so. Use a soft toothbrush to brush your teeth. Floss gently.  Avoid all smoking, herbs,  and alcohol. Avoid drugs that are not approved by your doctor.  Do not use any products that contain nicotine or tobacco, such as cigarettes and e-cigarettes. If you need help quitting, ask your doctor.  Avoid cat litter boxes and soil used by cats. These carry germs that can cause birth defects in the baby and can cause a loss of your baby (miscarriage) or stillbirth. Contact a doctor if:  You have mild cramps or pressure in your lower belly.  You have pain when you pee (urinate).  You have bad smelling fluid coming from your vagina.  You continue to  feel sick to your stomach (nauseous), throw up (vomit), or have watery poop (diarrhea).  You have a nagging pain in your belly area.  You feel dizzy. Get help right away if:  You have a fever.  You are leaking fluid from your vagina.  You have spotting or bleeding from your vagina.  You have severe belly cramping or pain.  You lose or gain weight rapidly.  You have trouble catching your breath and have chest pain.  You notice sudden or extreme puffiness (swelling) of your face, hands, ankles, feet, or legs.  You have not felt the baby move in over an hour.  You have severe headaches that do not go away when you take medicine.  You have trouble seeing. Summary  The second trimester is from week 14 through week 27 (months 4 through 6). This is often the time in pregnancy that you feel your best.  To take care of yourself and your unborn baby, you will need to eat healthy meals, take medicines only if your doctor tells you to do so, and do activities that are safe for you and your baby.  Call your doctor if you get sick or if you notice anything unusual about your pregnancy. Also, call your doctor if you need help with the right food to eat, or if you want to know what activities are safe for you. This information is not intended to replace advice given to you by your health care provider. Make sure you discuss any questions you have with your health care provider. Document Revised: 02/07/2019 Document Reviewed: 11/21/2016 Elsevier Patient Education  [place]. Common Medications Safe in Pregnancy  Acne:      Constipation:  Benzoyl Peroxide     Colace  Clindamycin      Dulcolax Suppository  Topica Erythromycin     Fibercon  Salicylic Acid      Metamucil         Miralax AVOID:        Senakot   Accutane    Cough:  Retin-A       Cough Drops  Tetracycline      Phenergan w/ Codeine if Rx  Minocycline      Robitussin (Plain &  DM)  Antibiotics:     Crabs/Lice:  Ceclor       RID  Cephalosporins    AVOID:  E-Mycins      Kwell  Keflex  Macrobid/Macrodantin   Diarrhea:  Penicillin      Kao-Pectate  Zithromax      Imodium AD         PUSH FLUIDS AVOID:       Cipro     Fever:  Tetracycline      Tylenol (Regular or Extra  Minocycline       Strength)  Levaquin      Extra Strength-Do not  Exceed 8 tabs/24 hrs Caffeine:        <235m/day (equiv. To 1 cup of coffee or  approx. 3 12 oz sodas)         Gas: Cold/Hayfever:       Gas-X  Benadryl      Mylicon  Claritin       Phazyme  **Claritin-D        Chlor-Trimeton    Headaches:  Dimetapp      ASA-Free Excedrin  Drixoral-Non-Drowsy     Cold Compress  Mucinex (Guaifenasin)     Tylenol (Regular or Extra  Sudafed/Sudafed-12 Hour     Strength)  **Sudafed PE Pseudoephedrine   Tylenol Cold & Sinus     Vicks Vapor Rub  Zyrtec  **AVOID if Problems With Blood Pressure         Heartburn: Avoid lying down for at least 1 hour after meals  Aciphex      Maalox     Rash:  Milk of Magnesia     Benadryl    Mylanta       1% Hydrocortisone Cream  Pepcid  Pepcid Complete   Sleep Aids:  Prevacid      Ambien   Prilosec       Benadryl  Rolaids       Chamomile Tea  Tums (Limit 4/day)     Unisom  Zantac       Tylenol PM         Warm milk-add vanilla or  Hemorrhoids:       Sugar for taste  Anusol/Anusol H.C.  (RX: Analapram 2.5%)  Sugar Substitutes:  Hydrocortisone OTC     Ok in moderation  Preparation H      Tucks        Vaseline lotion applied to tissue with wiping    Herpes:     Throat:  Acyclovir      Oragel  Famvir  Valtrex     Vaccines:         Flu Shot Leg Cramps:       *Gardasil  Benadryl      Hepatitis A         Hepatitis B Nasal Spray:       Pneumovax  Saline Nasal Spray     Polio Booster         Tetanus Nausea:       Tuberculosis test or PPD  Vitamin B6 25 mg TID   AVOID:    Dramamine      *Gardasil  Emetrol       Live  Poliovirus  Ginger Root 250 mg QID    MMR (measles, mumps &  High Complex Carbs @ Bedtime    rebella)  Sea Bands-Accupressure    Varicella (Chickenpox)  Unisom 1/2 tab TID     *No known complications           If received before Pain:         Known pregnancy;   Darvocet       Resume series after  Lortab        Delivery  Percocet    Yeast:   Tramadol      Femstat  Tylenol 3      Gyne-lotrimin  Ultram       Monistat  Vicodin           MISC:         All Sunscreens  Hair Coloring/highlights          Insect Repellant's          (Including DEET)         Mystic Tans Breastfeeding  Choosing to breastfeed is one of the best decisions you can make for yourself and your baby. A change in hormones during pregnancy causes your breasts to make breast milk in your milk-producing glands. Hormones prevent breast milk from being released before your baby is born. They also prompt milk flow after birth. Once breastfeeding has begun, thoughts of your baby, as well as his or her sucking or crying, can stimulate the release of milk from your milk-producing glands. Benefits of breastfeeding Research shows that breastfeeding offers many health benefits for infants and mothers. It also offers a cost-free and convenient way to feed your baby. For your baby  Your first milk (colostrum) helps your baby's digestive system to function better.  Special cells in your milk (antibodies) help your baby to fight off infections.  Breastfed babies are less likely to develop asthma, allergies, obesity, or type 2 diabetes. They are also at lower risk for sudden infant death syndrome (SIDS).  Nutrients in breast milk are better able to meet your baby's needs compared to infant formula.  Breast milk improves your baby's brain development. For you  Breastfeeding helps to create a very special bond between you and your baby.  Breastfeeding is convenient. Breast milk costs nothing and is always available at the  correct temperature.  Breastfeeding helps to burn calories. It helps you to lose the weight that you gained during pregnancy.  Breastfeeding makes your uterus return faster to its size before pregnancy. It also slows bleeding (lochia) after you give birth.  Breastfeeding helps to lower your risk of developing type 2 diabetes, osteoporosis, rheumatoid arthritis, cardiovascular disease, and breast, ovarian, uterine, and endometrial cancer later in life. Breastfeeding basics Starting breastfeeding  Find a comfortable place to sit or lie down, with your neck and back well-supported.  Place a pillow or a rolled-up blanket under your baby to bring him or her to the level of your breast (if you are seated). Nursing pillows are specially designed to help support your arms and your baby while you breastfeed.  Make sure that your baby's tummy (abdomen) is facing your abdomen.  Gently massage your breast. With your fingertips, massage from the outer edges of your breast inward toward the nipple. This encourages milk flow. If your milk flows slowly, you may need to continue this action during the feeding.  Support your breast with 4 fingers underneath and your thumb above your nipple (make the letter "C" with your hand). Make sure your fingers are well away from your nipple and your baby's mouth.  Stroke your baby's lips gently with your finger or nipple.  When your baby's mouth is open wide enough, quickly bring your baby to your breast, placing your entire nipple and as much of the areola as possible into your baby's mouth. The areola is the colored area around your nipple. ? More areola should be visible above your baby's upper lip than below the lower lip. ? Your baby's lips should be opened and extended outward (flanged) to ensure an adequate, comfortable latch. ? Your baby's tongue should be between his or her lower gum and your breast.  Make sure that your baby's mouth is correctly positioned  around your nipple (latched). Your baby's lips should create a seal on your breast and be turned  out (everted).  It is common for your baby to suck about 2-3 minutes in order to start the flow of breast milk. Latching Teaching your baby how to latch onto your breast properly is very important. An improper latch can cause nipple pain, decreased milk supply, and poor weight gain in your baby. Also, if your baby is not latched onto your nipple properly, he or she may swallow some air during feeding. This can make your baby fussy. Burping your baby when you switch breasts during the feeding can help to get rid of the air. However, teaching your baby to latch on properly is still the best way to prevent fussiness from swallowing air while breastfeeding. Signs that your baby has successfully latched onto your nipple  Silent tugging or silent sucking, without causing you pain. Infant's lips should be extended outward (flanged).  Swallowing heard between every 3-4 sucks once your milk has started to flow (after your let-down milk reflex occurs).  Muscle movement above and in front of his or her ears while sucking. Signs that your baby has not successfully latched onto your nipple  Sucking sounds or smacking sounds from your baby while breastfeeding.  Nipple pain. If you think your baby has not latched on correctly, slip your finger into the corner of your baby's mouth to break the suction and place it between your baby's gums. Attempt to start breastfeeding again. Signs of successful breastfeeding Signs from your baby  Your baby will gradually decrease the number of sucks or will completely stop sucking.  Your baby will fall asleep.  Your baby's body will relax.  Your baby will retain a small amount of milk in his or her mouth.  Your baby will let go of your breast by himself or herself. Signs from you  Breasts that have increased in firmness, weight, and size 1-3 hours after  feeding.  Breasts that are softer immediately after breastfeeding.  Increased milk volume, as well as a change in milk consistency and color by the fifth day of breastfeeding.  Nipples that are not sore, cracked, or bleeding. Signs that your baby is getting enough milk  Wetting at least 1-2 diapers during the first 24 hours after birth.  Wetting at least 5-6 diapers every 24 hours for the first week after birth. The urine should be clear or pale yellow by the age of 5 days.  Wetting 6-8 diapers every 24 hours as your baby continues to grow and develop.  At least 3 stools in a 24-hour period by the age of 5 days. The stool should be soft and yellow.  At least 3 stools in a 24-hour period by the age of 7 days. The stool should be seedy and yellow.  No loss of weight greater than 10% of birth weight during the first 3 days of life.  Average weight gain of 4-7 oz (113-198 g) per week after the age of 4 days.  Consistent daily weight gain by the age of 5 days, without weight loss after the age of 2 weeks. After a feeding, your baby may spit up a small amount of milk. This is normal. Breastfeeding frequency and duration Frequent feeding will help you make more milk and can prevent sore nipples and extremely full breasts (breast engorgement). Breastfeed when you feel the need to reduce the fullness of your breasts or when your baby shows signs of hunger. This is called "breastfeeding on demand." Signs that your baby is hungry include:  Increased alertness, activity,  or restlessness.  Movement of the head from side to side.  Opening of the mouth when the corner of the mouth or cheek is stroked (rooting).  Increased sucking sounds, smacking lips, cooing, sighing, or squeaking.  Hand-to-mouth movements and sucking on fingers or hands.  Fussing or crying. Avoid introducing a pacifier to your baby in the first 4-6 weeks after your baby is born. After this time, you may choose to use a  pacifier. Research has shown that pacifier use during the first year of a baby's life decreases the risk of sudden infant death syndrome (SIDS). Allow your baby to feed on each breast as long as he or she wants. When your baby unlatches or falls asleep while feeding from the first breast, offer the second breast. Because newborns are often sleepy in the first few weeks of life, you may need to awaken your baby to get him or her to feed. Breastfeeding times will vary from baby to baby. However, the following rules can serve as a guide to help you make sure that your baby is properly fed:  Newborns (babies 59 weeks of age or younger) may breastfeed every 1-3 hours.  Newborns should not go without breastfeeding for longer than 3 hours during the day or 5 hours during the night.  You should breastfeed your baby a minimum of 8 times in a 24-hour period. Breast milk pumping     Pumping and storing breast milk allows you to make sure that your baby is exclusively fed your breast milk, even at times when you are unable to breastfeed. This is especially important if you go back to work while you are still breastfeeding, or if you are not able to be present during feedings. Your lactation consultant can help you find a method of pumping that works best for you and give you guidelines about how long it is safe to store breast milk. Caring for your breasts while you breastfeed Nipples can become dry, cracked, and sore while breastfeeding. The following recommendations can help keep your breasts moisturized and healthy:  Avoid using soap on your nipples.  Wear a supportive bra designed especially for nursing. Avoid wearing underwire-style bras or extremely tight bras (sports bras).  Air-dry your nipples for 3-4 minutes after each feeding.  Use only cotton bra pads to absorb leaked breast milk. Leaking of breast milk between feedings is normal.  Use lanolin on your nipples after breastfeeding. Lanolin  helps to maintain your skin's normal moisture barrier. Pure lanolin is not harmful (not toxic) to your baby. You may also hand express a few drops of breast milk and gently massage that milk into your nipples and allow the milk to air-dry. In the first few weeks after giving birth, some women experience breast engorgement. Engorgement can make your breasts feel heavy, warm, and tender to the touch. Engorgement peaks within 3-5 days after you give birth. The following recommendations can help to ease engorgement:  Completely empty your breasts while breastfeeding or pumping. You may want to start by applying warm, moist heat (in the shower or with warm, water-soaked hand towels) just before feeding or pumping. This increases circulation and helps the milk flow. If your baby does not completely empty your breasts while breastfeeding, pump any extra milk after he or she is finished.  Apply ice packs to your breasts immediately after breastfeeding or pumping, unless this is too uncomfortable for you. To do this: ? Put ice in a plastic bag. ? Place a  towel between your skin and the bag. ? Leave the ice on for 20 minutes, 2-3 times a day.  Make sure that your baby is latched on and positioned properly while breastfeeding. If engorgement persists after 48 hours of following these recommendations, contact your health care provider or a Science writer. Overall health care recommendations while breastfeeding  Eat 3 healthy meals and 3 snacks every day. Well-nourished mothers who are breastfeeding need an additional 450-500 calories a day. You can meet this requirement by increasing the amount of a balanced diet that you eat.  Drink enough water to keep your urine pale yellow or clear.  Rest often, relax, and continue to take your prenatal vitamins to prevent fatigue, stress, and low vitamin and mineral levels in your body (nutrient deficiencies).  Do not use any products that contain nicotine or  tobacco, such as cigarettes and e-cigarettes. Your baby may be harmed by chemicals from cigarettes that pass into breast milk and exposure to secondhand smoke. If you need help quitting, ask your health care provider.  Avoid alcohol.  Do not use illegal drugs or marijuana.  Talk with your health care provider before taking any medicines. These include over-the-counter and prescription medicines as well as vitamins and herbal supplements. Some medicines that may be harmful to your baby can pass through breast milk.  It is possible to become pregnant while breastfeeding. If birth control is desired, ask your health care provider about options that will be safe while breastfeeding your baby. Where to find more information: [employer]: www.llli.org Contact a health care provider if:  You feel like you want to stop breastfeeding or have become frustrated with breastfeeding.  Your nipples are cracked or bleeding.  Your breasts are red, tender, or warm.  You have: ? Painful breasts or nipples. ? A swollen area on either breast. ? A fever or chills. ? Nausea or vomiting. ? Drainage other than breast milk from your nipples.  Your breasts do not become full before feedings by the fifth day after you give birth.  You feel sad and depressed.  Your baby is: ? Too sleepy to eat well. ? Having trouble sleeping. ? More than 19 week old and wetting fewer than 6 diapers in a 24-hour period. ? Not gaining weight by 61 days of age.  Your baby has fewer than 3 stools in a 24-hour period.  Your baby's skin or the white parts of his or her eyes become yellow. Get help right away if:  Your baby is overly tired (lethargic) and does not want to wake up and feed.  Your baby develops an unexplained fever. Summary  Breastfeeding offers many health benefits for infant and mothers.  Try to breastfeed your infant when he or she shows early signs of hunger.  Gently tickle or stroke  your baby's lips with your finger or nipple to allow the baby to open his or her mouth. Bring the baby to your breast. Make sure that much of the areola is in your baby's mouth. Offer one side and burp the baby before you offer the other side.  Talk with your health care provider or lactation consultant if you have questions or you face problems as you breastfeed. This information is not intended to replace advice given to you by your health care provider. Make sure you discuss any questions you have with your health care provider. Document Revised: 01/10/2018 Document Reviewed: 11/17/2016 Elsevier Patient Education  [place].

## 2020-01-19 NOTE — Progress Notes (Signed)
ROB-Pt present for routine prenatal care. Pt stated having extreme tooth pain. Pt has an appointment with her dentist today. Xray clearance form given to pt.

## 2020-01-19 NOTE — Telephone Encounter (Signed)
Pt is having her wisdom tooth removed. The pt was told by her dentist to call to see if she can have an x-ray.  The pt has an appointment tomorrow. The pt is requesting a call from the nurse.

## 2020-01-19 NOTE — Telephone Encounter (Signed)
Pt called and is aware that she can get an xray done and we have a form that she needs to take to her dentist. Pt stated that she will get the note during her appointment tomorrow.

## 2020-01-19 NOTE — Telephone Encounter (Signed)
Patient can have a X-ray if it is necessary for her care, otherwise can be deferred until after the pregnancy. Should use abdominal shielding when x-ray performed.

## 2020-01-20 ENCOUNTER — Other Ambulatory Visit: Payer: Self-pay

## 2020-01-20 ENCOUNTER — Encounter: Payer: Self-pay | Admitting: Obstetrics and Gynecology

## 2020-01-20 ENCOUNTER — Ambulatory Visit (INDEPENDENT_AMBULATORY_CARE_PROVIDER_SITE_OTHER): Payer: Medicaid Other | Admitting: Obstetrics and Gynecology

## 2020-01-20 VITALS — BP 103/66 | HR 78 | Wt 200.0 lb

## 2020-01-20 DIAGNOSIS — Z1379 Encounter for other screening for genetic and chromosomal anomalies: Secondary | ICD-10-CM

## 2020-01-20 DIAGNOSIS — Z3A16 16 weeks gestation of pregnancy: Secondary | ICD-10-CM

## 2020-01-20 DIAGNOSIS — K0889 Other specified disorders of teeth and supporting structures: Secondary | ICD-10-CM

## 2020-01-20 DIAGNOSIS — Z3482 Encounter for supervision of other normal pregnancy, second trimester: Secondary | ICD-10-CM

## 2020-01-20 DIAGNOSIS — Z8616 Personal history of COVID-19: Secondary | ICD-10-CM

## 2020-01-20 DIAGNOSIS — Z6791 Unspecified blood type, Rh negative: Secondary | ICD-10-CM

## 2020-01-20 DIAGNOSIS — O26899 Other specified pregnancy related conditions, unspecified trimester: Secondary | ICD-10-CM

## 2020-01-20 LAB — POCT URINALYSIS DIPSTICK OB
Bilirubin, UA: NEGATIVE
Blood, UA: NEGATIVE
Glucose, UA: NEGATIVE
Ketones, UA: NEGATIVE
Nitrite, UA: NEGATIVE
Spec Grav, UA: 1.02 (ref 1.010–1.025)
Urobilinogen, UA: 0.2 E.U./dL
pH, UA: 7 (ref 5.0–8.0)

## 2020-01-20 NOTE — Telephone Encounter (Signed)
error 

## 2020-01-20 NOTE — Progress Notes (Signed)
ROB: Patient complains of tooth pain for the past several weeks, plans to see dentist today. Has been using Orajel with minimal relief. Currently on Clindamycin.  Rh neg, received Rhogam in 1st trimester. To repeat at 28 weeks. Rubella non-immune, for vaccine postpartum. Patient admits to having COVID in February, overall doing well now. For AFP today.  RTC in 4 weeks. For anatomy scan at that time.

## 2020-01-23 LAB — AFP, SERUM, OPEN SPINA BIFIDA
AFP MoM: 0.75
AFP Value: 21.8 ng/mL
Gest. Age on Collection Date: 16.1 weeks
Maternal Age At EDD: 28.7 yr
OSBR Risk 1 IN: 10000
Test Results:: NEGATIVE
Weight: 200 [lb_av]

## 2020-02-09 ENCOUNTER — Telehealth: Payer: Self-pay | Admitting: Obstetrics and Gynecology

## 2020-02-09 NOTE — Telephone Encounter (Signed)
Patient called in saying she was having abdominal pain. Patient denied abdominal bleeding, vaginal pain, or loss of mucus plug Informed patient if she was having severe discomfort that she could go to the ED to be evaluated. Patient was wanting to be seen today plus have a ultrasound done. Informed patient the protocol with ultrasounds and told her Evans and Valentino Saxon were both out of the office today. Informed patient I would leave a telephone encounter for the nurse.

## 2020-02-10 NOTE — Telephone Encounter (Signed)
LM to return call.

## 2020-02-11 ENCOUNTER — Encounter: Payer: Self-pay | Admitting: Surgical

## 2020-02-11 NOTE — Telephone Encounter (Signed)
Patient stated that her pain is getting better. She has some pain under her breast sometimes. I am going to send her a safe medication list. She does not have a BM everyday so I told her that could be some of the pain. She will call back if she continues to have problems.

## 2020-02-16 ENCOUNTER — Telehealth: Payer: Self-pay | Admitting: Obstetrics and Gynecology

## 2020-02-16 NOTE — Telephone Encounter (Signed)
Patient called in saying she is at her PCP office and her MD needs to know what he can prescribe her for poison oak. Patient is 5 months pregnant and states that the rash is covering majority of her body. Patient would like a call asap as she is waiting in her PCP office.

## 2020-02-16 NOTE — Telephone Encounter (Signed)
Per Dr. Valentino Saxon ok for patient to take the oral and topical steroid medication. Her PCP was on speaker phone and able to hear so he will prescribe both. He stated that patient has on face and several areas of body.

## 2020-02-17 ENCOUNTER — Other Ambulatory Visit: Payer: Self-pay

## 2020-02-17 ENCOUNTER — Encounter: Payer: Self-pay | Admitting: Obstetrics and Gynecology

## 2020-02-17 ENCOUNTER — Ambulatory Visit (INDEPENDENT_AMBULATORY_CARE_PROVIDER_SITE_OTHER): Payer: Medicaid Other | Admitting: Obstetrics and Gynecology

## 2020-02-17 ENCOUNTER — Ambulatory Visit (INDEPENDENT_AMBULATORY_CARE_PROVIDER_SITE_OTHER): Payer: Medicaid Other

## 2020-02-17 VITALS — BP 106/67 | HR 86 | Wt 201.8 lb

## 2020-02-17 DIAGNOSIS — Z3A20 20 weeks gestation of pregnancy: Secondary | ICD-10-CM | POA: Diagnosis not present

## 2020-02-17 DIAGNOSIS — Z3482 Encounter for supervision of other normal pregnancy, second trimester: Secondary | ICD-10-CM

## 2020-02-17 LAB — POCT URINALYSIS DIPSTICK OB
Bilirubin, UA: NEGATIVE
Blood, UA: NEGATIVE
Glucose, UA: NEGATIVE
Leukocytes, UA: NEGATIVE
Nitrite, UA: NEGATIVE
Spec Grav, UA: 1.01 (ref 1.010–1.025)
Urobilinogen, UA: 0.2 E.U./dL
pH, UA: 6.5 (ref 5.0–8.0)

## 2020-02-17 NOTE — Progress Notes (Signed)
ROB: Patient got poison oak last weekend is currently on prednisone.  Daily fetal movement.  FAS today normal.  Complains of a great deal of difficulty swallowing be 1 hour glucose load.  Substitution sheet given.  The following were addressed during this visit:  Breastfeeding Education - Early initiation of breastfeeding    Comments: Keeps milk supply adequate, helps contract uterus and slow bleeding, and early milk is the perfect first food and is easy to digest.   - The importance of exclusive breastfeeding    Comments: Provides antibodies, Lower risk of breast and ovarian cancers, and type-2 diabetes,Helps your body recover, Reduced chance of SIDS.   - The importance of early skin-to-skin contact    Comments: Keeps baby warm and secure, helps keep baby's blood sugar up and breathing steady, easier to bond and breastfeed, and helps calm baby.  - Rooming-in on a 24-hour basis    Comments: Easier to learn baby's feeding cues, easier to bond and get to know each other, and encourages milk production.

## 2020-02-24 ENCOUNTER — Telehealth: Payer: Self-pay | Admitting: Obstetrics and Gynecology

## 2020-02-24 NOTE — Telephone Encounter (Signed)
LM for patient to return call.

## 2020-02-24 NOTE — Telephone Encounter (Signed)
Please advise 

## 2020-02-24 NOTE — Telephone Encounter (Signed)
Patient called in saying she was at the urgent care with poison ivy. Patient's provider at urgent care is asking what she can take for her poison ivy, as he doesn't want to give her too much steroids. Patient is waiting in urgent care for a list of medications safe for her to take while pregnant. Referred patient to list you had sent her in MyChart however she needs more information.

## 2020-03-11 ENCOUNTER — Other Ambulatory Visit: Payer: Self-pay

## 2020-03-11 ENCOUNTER — Observation Stay
Admission: EM | Admit: 2020-03-11 | Discharge: 2020-03-11 | Disposition: A | Discharge status: Home / Self Care | Payer: Medicaid Other | Attending: Obstetrics and Gynecology | Admitting: Obstetrics and Gynecology

## 2020-03-11 ENCOUNTER — Telehealth: Payer: Self-pay | Admitting: Obstetrics and Gynecology

## 2020-03-11 ENCOUNTER — Encounter: Payer: Self-pay | Admitting: Obstetrics and Gynecology

## 2020-03-11 DIAGNOSIS — Z3A23 23 weeks gestation of pregnancy: Secondary | ICD-10-CM | POA: Insufficient documentation

## 2020-03-11 DIAGNOSIS — S301XXA Contusion of abdominal wall, initial encounter: Secondary | ICD-10-CM | POA: Insufficient documentation

## 2020-03-11 DIAGNOSIS — O26892 Other specified pregnancy related conditions, second trimester: Secondary | ICD-10-CM | POA: Diagnosis not present

## 2020-03-11 DIAGNOSIS — R109 Unspecified abdominal pain: Secondary | ICD-10-CM | POA: Insufficient documentation

## 2020-03-11 DIAGNOSIS — X58XXXA Exposure to other specified factors, initial encounter: Secondary | ICD-10-CM | POA: Insufficient documentation

## 2020-03-11 MED ORDER — ACETAMINOPHEN 500 MG PO TABS
ORAL_TABLET | ORAL | Status: AC
  Filled 2020-03-11: qty 2

## 2020-03-11 MED ORDER — ACETAMINOPHEN 500 MG PO TABS
1000.0000 mg | ORAL_TABLET | Freq: Four times a day (QID) | ORAL | Status: DC | PRN
  Administered 2020-03-11: 1000 mg via ORAL

## 2020-03-11 NOTE — Progress Notes (Signed)
RN went over discharge instructions with patient and patient verbalized understanding.

## 2020-03-11 NOTE — Final Progress Note (Signed)
L&D OB Triage Note  Hannah Ball is a 29 y.o. [id number] female at [redacted]w[redacted]d, EDD Estimated Date of Delivery: 07/05/20 who presented to triage for complaints of abdominal pain and bruising on her right side x 1 day. Is unaware of what caused the bruise (denies any recent abdominal trauma). Pain is ~ 8/10. She was evaluated by the nurses with no significant findings.  Notes good fetal movement and denies any vaginal bleeding or discharge or urinary symptoms. Vital signs stable. An NST was performed and has been reviewed by MD. She was treated with 1000 mg of Tylenol and ice pack to the area.    NST INTERPRETATION: Indications: patient reassurance  Mode: External Baseline Rate (A): 145 bpm(fht) Variability: Moderate Accelerations: 10 x 10 Decelerations: None     Contraction Frequency (min): none  Impression: reactive   Plan: NST performed was reviewed and was found to be reactive. She was discharged home with pain precautions.  Advised on continued use of Tylenol and ice packs prn. Continue routine prenatal care. Follow up with OB/GYN as previously scheduled.     Hildred Laser, MD  [facility]

## 2020-03-11 NOTE — Telephone Encounter (Signed)
Spoke to pt. Pt stated that she was at hospital and was being treated.

## 2020-03-11 NOTE — OB Triage Note (Signed)
Pt. Presented to L/D triage with reported abdominal pain where she has a bruise. The bruise is located on her right, lower abdomen. She does not remember getting the bruise or receiving any abdominal trauma. The pain is constant, rated 8/10. No bleeding or LOF, positive fetal movement stated. She reports no urinary symptoms. She also reports pelvic pressure that began 4-5 days prior. VSS. Monitors applied and assessing.

## 2020-03-11 NOTE — Telephone Encounter (Signed)
The pt has a bruise on my right side. The pt is wanting to be seen. She stated that she can still feel the baby move. She is having sharp pains. The pt is requesting a call back. Please advise

## 2020-03-16 ENCOUNTER — Ambulatory Visit (INDEPENDENT_AMBULATORY_CARE_PROVIDER_SITE_OTHER): Payer: Medicaid Other | Admitting: Obstetrics and Gynecology

## 2020-03-16 ENCOUNTER — Encounter: Payer: Self-pay | Admitting: Obstetrics and Gynecology

## 2020-03-16 ENCOUNTER — Other Ambulatory Visit: Payer: Self-pay

## 2020-03-16 VITALS — BP 91/58 | HR 89 | Wt 208.4 lb

## 2020-03-16 DIAGNOSIS — R102 Pelvic and perineal pain: Secondary | ICD-10-CM

## 2020-03-16 DIAGNOSIS — Z130 Encounter for screening for diseases of the blood and blood-forming organs and certain disorders involving the immune mechanism: Secondary | ICD-10-CM

## 2020-03-16 DIAGNOSIS — O26899 Other specified pregnancy related conditions, unspecified trimester: Secondary | ICD-10-CM

## 2020-03-16 DIAGNOSIS — Z3482 Encounter for supervision of other normal pregnancy, second trimester: Secondary | ICD-10-CM

## 2020-03-16 DIAGNOSIS — Z3A24 24 weeks gestation of pregnancy: Secondary | ICD-10-CM

## 2020-03-16 DIAGNOSIS — Z131 Encounter for screening for diabetes mellitus: Secondary | ICD-10-CM

## 2020-03-16 LAB — POCT URINALYSIS DIPSTICK OB
Bilirubin, UA: NEGATIVE
Blood, UA: NEGATIVE
Glucose, UA: NEGATIVE
Ketones, UA: NEGATIVE
Nitrite, UA: NEGATIVE
POC,PROTEIN,UA: NEGATIVE
Spec Grav, UA: 1.015 (ref 1.010–1.025)
Urobilinogen, UA: 0.2 E.U./dL
pH, UA: 7.5 (ref 5.0–8.0)

## 2020-03-16 NOTE — Patient Instructions (Addendum)
Breastfeeding Tips for a Good Latch Breastfeeding can be challenging, especially during the first few weeks after childbirth. It is normal to have some problems when you start to breastfeed your new baby, even if you have breastfed before. Latching is an important part of having a good breastfeeding experience. This refers to how your baby's mouth attaches to your nipple to breastfeed. Your baby may have trouble latching due to:  Poor positioning.  Nipple confusion. This can occur if you introduce a bottle or pacifier too early.  Abnormalities in your baby's mouth, tongue, or lips. This includes conditions like tongue-tie or cleft lip.  The shape of your nipples, such as nipples that are flat or turned in (inverted).  Your baby being born early (prematurely). Small babies often have a weak suck. Work with a breastfeeding specialist (Science writer) to find positions and strategies that will help make sure your baby has a good latch. How does this affect me? A poor latch may cause you to have problems such as:  Cracked or sore nipples.  Breasts becoming overfilled with milk (engorgement).  Plugged milk ducts.  Low milk supply.  Breast inflammation or infection. How does this affect my baby? A poor latch may cause your baby to not be able to feed effectively. As a result, he or she may have trouble gaining weight. Follow these instructions at home: How to position your baby  Find a comfortable place to sit or lie down, with your neck and back well supported.  If you are seated, place a pillow or rolled-up blanket under your baby to bring him or her to the level of your breast.  Make sure that your baby's abdomen is facing your abdomen.  Try different positions to find one that works best for you and your baby. How to help your baby latch  To begin each breastfeeding session, gently massage your breast. With your fingertips, massage from your chest wall toward your nipple in  a circular motion. This encourages milk flow. If your milk flows slowly, you may need to continue this action during feeding. Position your breast for an ideal latch. Support your breast with four fingers underneath and your thumb above your nipple. Keep your fingers away from your nipple and your baby's mouth. To help your baby latch, follow these steps: 1. Stroke your baby's lips gently with your finger or nipple. 2. When your baby's mouth is open wide enough, quickly bring your baby to your breast and place your entire nipple, and as much of the areolaas possible, into your baby's mouth. The areola is the colored area around your nipple. 3. Your baby's tongue should be between his or her lower gum and your breast. 4. More areola should be visible above your baby's upper lip than below the lower lip. 5. When your baby starts sucking, you will feel a gentle pull on your nipple, but you should not feel pain. Be patient. It is common for a baby to suck for about 2-3 minutes to start the flow of breast milk. 6. Make sure that your baby's mouth is correctly positioned around your nipple. Your baby's lips should create a seal on your breast and be turned outward (everted).  General instructions  Look for the following signs that your baby has successfully latched on to your nipple: ? The baby is quietly tugging or quietly sucking without causing you pain. ? You hear the baby swallow after every 3 or 4 sucks. ? You see muscle movement  above and in front of the baby's ears while he or she is sucking.  Be aware of these signs that your baby has not successfully latched on to your nipple: ? The baby makes sucking sounds or smacking sounds while nursing. ? You have nipple pain.  If your baby is not latched well, insert your little finger between your baby's gums and your nipple to break the seal. Then try to help your baby latch again. Contact a health care provider if:  You have cracking or soreness  in your nipples that lasts longer than 1 week.  You have nipple pain. Nipple cracking and soreness are common during the first week after birth, but nipple pain is never normal.  You have breast engorgement that does not improve after 48-72 hours.  You have a plugged milk duct and a fever.  You follow suggestions for a good latch but you continue to have problems or concerns.  You have pus-like discharge coming from your breast.  Your baby is not gaining weight or loses weight. Summary  Many common breastfeeding challenges are caused by poor latching. Latching refers to how your baby's mouth attaches to your nipple during breastfeeding.  If problems continue, seek help from a lactation consultant in your community. He or she can assess you and your baby for any latching problems. The lactation consultant can work with you to develop a plan to overcome any breastfeeding challenges. This information is not intended to replace advice given to you by your health care provider. Make sure you discuss any questions you have with your health care provider. Document Revised: 02/05/2019 Document Reviewed: 05/30/2017 Elsevier Patient Education  2020 [place] TESTING FOR PREGNANCY  Pregnant women can develop a condition known as Gestational Diabetes (diabetes brought on by pregnancy) which can pose a risk to both mother and baby. A glucose tolerance test is a common type of testing for potential gestational diabetes.  There are several tests intended to identify gestational diabetes in pregnant women. The first, called the Glucose Challenge Screening, is a preliminary screening test performed between 26-28 weeks. If a woman tests positive during this screening test, the second test, called the Glucose Tolerance Test, may be performed. This test will diagnose whether diabetes exists or not by indicating whether or not the body is using glucose (a type of sugar)  effectively.  The Glucose Challenge Screening is now considered to be a standard test performed during the early part of the third trimester of pregnancy.  What is the Glucose Challenge Screening Test? No preparation is required prior to the test. During the test, the mother is asked to drink a sweet liquid (glucose) and then will have blood drawn one hour from having the drink, as blood glucose levels normally peak within one hour. No fasting is required prior to this test.  The test evaluates how your body processes sugar. A high level in your blood may indicate your body is not processing sugar effectively (positive test). If the results of this screen are positive, the woman may have the Glucose Tolerance Test performed. It is important to note that not all women who test positive for the Glucose Challenge Screening test are found to have diabetes upon further diagnosis.  What is the Glucose Tolerance Test? Prior to the taking the glucose tolerance test, your doctor will ask you to make sure and eat at least 150mg  of carbohydrates (about what you will get from a slice or  two of bread) for three days prior to the time you will be asked to fast. You will not be permitted to eat or drink anything but sips of water for 14 hours prior to the test, so it is best to schedule the test for first thing in the morning.  Additionally, you should plan to have someone drive you to and from the test since your energy levels may be low and there is a slight possibility you may feel light-headed.  When you arrive, the technician will draw blood to measure your baseline "fasting blood glucose level". You will be asked to drink a larger volume (or more concentrated solution) of the glucose drink than was used in the initial Glucose Challenge Screening test. Your blood will be drawn and tested every hour for the next three hours.  The following are the values that the American Diabetes Association considers to be  abnormal during the Glucose Tolerance Test:  Interval Abnormal reading Fasting 95 mg/dl or higher One hour 937 mg/dl or higher Two hours 902 mg/dl or higher Three hours 409 mg/dl or higher  What if my Glucose Tolerance Test Results are Abnormal? If only one of your readings comes back abnormal, your doctor may suggest some changes to your diet and/or test you again later in the pregnancy. If two or more of your readings come back abnormal, you'll be diagnosed with Gestational Diabetes and your doctor or midwife will talk to you about a treatment plan. Treating diabetes during pregnancy is extremely important to protect the health of both mother and baby.   Compiled using information from the following sources:  1. American Diabetes Association  https://www.diabetes.org  2. [employer]  https://www.emedicine.com  3. [employer] of Diabetes and Digestive and Kidney Diseases

## 2020-03-16 NOTE — Progress Notes (Signed)
ROB-Pt present for routine prenatal care. Pt stated having lower abd pressure and braxton hick contractions.

## 2020-03-16 NOTE — Progress Notes (Signed)
ROB: Patient notes lots of pelvic pressure.  Advised on belly band.  Discussed contraception options, considering IUD. RTC in 4 weeks, for 28 week labs and Rhogam.     The following were addressed during this visit:  Breastfeeding Education - Risks of giving your baby anything other than breast milk if you are breastfeeding    Comments: Make the baby less content with breastfeeds, may make my baby more susceptible to illness, and may reduce my milk supply.   - Feeding on demand or baby-led feeding    Comments: Helps prevent breastfeeding complications, helps bring in good milk supply, prevents under or overfeeding, and helps baby feel content and satisfied   - Frequent feeding to help assure optimal milk production    Comments: Making a full supply of milk requires frequent removal of milk from breasts, infant will eat 8-12 times in 24 hours, if separated from infant use breast massage, hand expression and/ or pumping to remove milk from breasts.   - Effective positioning and attachment    Comments: Helps my baby to get enough breast milk, helps to produce an adequate milk supply, and helps prevent nipple pain and damage   - Exclusive breastfeeding for the first 6 months    Comments: Builds a healthy milk supply and keeps it up, protects baby from sickness and disease, and breastmilk has everything your baby needs for the first 6 months.

## 2020-04-14 ENCOUNTER — Other Ambulatory Visit: Payer: Medicaid Other

## 2020-04-14 ENCOUNTER — Ambulatory Visit (INDEPENDENT_AMBULATORY_CARE_PROVIDER_SITE_OTHER): Payer: Medicaid Other | Admitting: Obstetrics and Gynecology

## 2020-04-14 ENCOUNTER — Other Ambulatory Visit: Payer: Self-pay

## 2020-04-14 VITALS — BP 105/62 | HR 88 | Wt 208.8 lb

## 2020-04-14 DIAGNOSIS — Z23 Encounter for immunization: Secondary | ICD-10-CM

## 2020-04-14 DIAGNOSIS — Z6791 Unspecified blood type, Rh negative: Secondary | ICD-10-CM | POA: Diagnosis not present

## 2020-04-14 DIAGNOSIS — Z130 Encounter for screening for diseases of the blood and blood-forming organs and certain disorders involving the immune mechanism: Secondary | ICD-10-CM

## 2020-04-14 DIAGNOSIS — O26892 Other specified pregnancy related conditions, second trimester: Secondary | ICD-10-CM

## 2020-04-14 DIAGNOSIS — Z3A28 28 weeks gestation of pregnancy: Secondary | ICD-10-CM

## 2020-04-14 DIAGNOSIS — Z131 Encounter for screening for diabetes mellitus: Secondary | ICD-10-CM

## 2020-04-14 DIAGNOSIS — Z3482 Encounter for supervision of other normal pregnancy, second trimester: Secondary | ICD-10-CM

## 2020-04-14 LAB — POCT URINALYSIS DIPSTICK OB
Bilirubin, UA: NEGATIVE
Blood, UA: NEGATIVE
Glucose, UA: NEGATIVE
Ketones, UA: NEGATIVE
Nitrite, UA: NEGATIVE
Odor: NEGATIVE
Spec Grav, UA: 1.02 (ref 1.010–1.025)
Urobilinogen, UA: 0.2 E.U./dL
pH, UA: 6 (ref 5.0–8.0)

## 2020-04-14 MED ORDER — TETANUS-DIPHTH-ACELL PERTUSSIS 5-2.5-18.5 LF-MCG/0.5 IM SUSP
0.5000 mL | Freq: Once | INTRAMUSCULAR | Status: AC
  Administered 2020-04-14: 0.5 mL via INTRAMUSCULAR

## 2020-04-14 MED ORDER — RHO D IMMUNE GLOBULIN 1500 UNITS IM SOSY
1500.0000 [IU] | PREFILLED_SYRINGE | Freq: Once | INTRAMUSCULAR | Status: AC
  Administered 2020-04-14: 1500 [IU] via INTRAMUSCULAR

## 2020-04-14 NOTE — Progress Notes (Signed)
ROB: Says belly band did not help.  She would just have to "live with it".  Has difficulty sleeping because of constant fetal movement.  Otherwise reports no issues.  1 hour GCT today.

## 2020-04-14 NOTE — Progress Notes (Signed)
ROB- GTT/BTC/ tdap/Rhogam-  done.

## 2020-04-14 NOTE — Patient Instructions (Signed)
Rh Incompatibility Rh incompatibility is a condition that occurs during pregnancy if a woman has Rh-negative blood and her baby has Rh-positive blood. "Rh-negative" and "Rh-positive" refer to whether or not the blood has a specific protein found on the surface of red blood cells (Rh factor). If a woman has Rh factor, she is Rh-positive. If she does not have an Rh factor, she is Rh-negative. Having or not having an Rh factor does not affect the mother's general health. However, it can cause problems during pregnancy. What kind of problems can Rh incompatibility cause? During pregnancy, blood from the baby can cross into the mother's bloodstream, especially during delivery. If a mother is Rh-negative and the baby is Rh-positive, the mother's defense (immune) system will react to the baby's blood as if it were a foreign substance and will create certain proteins (antibodies) in response to it. This process is called sensitization. Once the mother is sensitized, her Rh antibodies will cross the placenta in future pregnancies and attack the baby's Rh-positive blood as if it were a harmful substance. Rh incompatibility can also happen if a pregnant woman who is Rh-negative receives a donation (transfusion) of Rh-positive blood. How does this condition affect my baby? The Rh antibodies that attack and destroy your baby's red blood cells can lead to hemolytic disease in the baby. This is a condition in which red blood cells break down. This can cause:  Yellowing of the skin and the whites of the eyes (jaundice).  Fewer red blood cells in the body (anemia).  Brain damage.  Heart failure.  Death. These antibodies usually do not cause problems during a woman's first pregnancy. This is because blood from the baby often crosses into the mother's bloodstream during delivery, and the baby is born before many of the antibodies can develop. However, once antibodies have formed, they stay in a woman's body. Because  of this, Rh incompatibility is more likely to cause problems in second or later pregnancies (if the baby is Rh-positive). How is this diagnosed?  When you become pregnant, you may have blood tests to determine your blood type and Rh factor. If you are Rh-negative, you may have another blood test called an antibody screen. The antibody screen shows whether you have Rh antibodies in your blood. If you do, it may mean that you have been exposed to Rh-positive blood before, and that you have a risk for Rh incompatibility. To find out whether your baby is developing hemolytic anemia and how serious it is, health care providers may use more advanced tests, such as ultrasound. How is Rh incompatibility treated? This condition is treated with two shots (injections) of medicine called Rho (D) immune globulin. This medicine keeps your body from making antibodies that can cause serious problems for your baby or for future babies. You will get one shot around your seventh month of pregnancy and the other within 72 hours of your baby's birth. If you are Rh-negative, you will need this medicine every time you have a baby with Rh-positive blood. If you are Rh-negative and there is a high risk of blood transfer between you and your baby, you may be given Rho (D) immune globulin. The risk of blood transfer is high if you experience:  Amniocentesis. This is a procedure to remove a small amount of the fluid that surrounds a baby in the uterus (amniotic fluid) so that it can be tested.  A miscarriage or an abortion.  An ectopic pregnancy. This is a pregnancy in  which the fertilized egg attaches (implants) outside the uterus.  Any vaginal bleeding during pregnancy. If you already have antibodies in your blood, your pregnancy will be closely monitored. You will not be given Rho (D) immune globulin because it is not effective in these cases. Summary  Rh incompatibility is a condition that occurs during pregnancy if a  woman has Rh-negative blood and her baby has Rh-positive blood.  If a mother is Rh-negative and the baby is Rh-positive, the mother's immune system will react to the baby's blood as if it were a foreign substance, and will create antibodies.  Rh antibodies that attack and destroy the baby's red blood cells can lead to hemolytic disease in the baby.  This condition is treated with a shot of medicine called Rho (D) immune globulin. This medicine keeps the woman's body from making antibodies that can cause serious problems in the baby or future babies. This information is not intended to replace advice given to you by your health care provider. Make sure you discuss any questions you have with your health care provider. Document Revised: 09/28/2017 Document Reviewed: 12/12/2016 Elsevier Patient Education  [place]. https://www.cdc.gov/vaccines/hcp/vis/vis-statements/tdap.pdf">  Tdap (Tetanus, Diphtheria, Pertussis) Vaccine: What You Need to Know 1. Why get vaccinated? Tdap vaccine can prevent tetanus, diphtheria, and pertussis. Diphtheria and pertussis spread from person to person. Tetanus enters the body through cuts or wounds.  TETANUS (T) causes painful stiffening of the muscles. Tetanus can lead to serious health problems, including being unable to open the mouth, having trouble swallowing and breathing, or death.  DIPHTHERIA (D) can lead to difficulty breathing, heart failure, paralysis, or death.  PERTUSSIS (aP), also known as "whooping cough," can cause uncontrollable, violent coughing which makes it hard to breathe, eat, or drink. Pertussis can be extremely serious in babies and young children, causing pneumonia, convulsions, brain damage, or death. In teens and adults, it can cause weight loss, loss of bladder control, passing out, and rib fractures from severe coughing. 2. Tdap vaccine Tdap is only for children 7 years and older, adolescents, and adults.  Adolescents should  receive a single dose of Tdap, preferably at age 37 or 4 years. Pregnant women should get a dose of Tdap during every pregnancy, to protect the newborn from pertussis. Infants are most at risk for severe, life-threatening complications from pertussis. Adults who have never received Tdap should get a dose of Tdap. Also, adults should receive a booster dose every 10 years, or earlier in the case of a severe and dirty wound or burn. Booster doses can be either Tdap or Td (a different vaccine that protects against tetanus and diphtheria but not pertussis). Tdap may be given at the same time as other vaccines. 3. Talk with your health care provider Tell your vaccine provider if the person getting the vaccine:  Has had an allergic reaction after a previous dose of any vaccine that protects against tetanus, diphtheria, or pertussis, or has any severe, life-threatening allergies.  Has had a coma, decreased level of consciousness, or prolonged seizures within 7 days after a previous dose of any pertussis vaccine (DTP, DTaP, or Tdap).  Has seizures or another nervous system problem.  Has ever had Guillain-Barr Syndrome (also called GBS).  Has had severe pain or swelling after a previous dose of any vaccine that protects against tetanus or diphtheria. In some cases, your health care provider may decide to postpone Tdap vaccination to a future visit.  People with minor illnesses, such as a  cold, may be vaccinated. People who are moderately or severely ill should usually wait until they recover before getting Tdap vaccine.  Your health care provider can give you more information. 4. Risks of a vaccine reaction  Pain, redness, or swelling where the shot was given, mild fever, headache, feeling tired, and nausea, vomiting, diarrhea, or stomachache sometimes happen after Tdap vaccine. People sometimes faint after medical procedures, including vaccination. Tell your provider if you feel dizzy or have vision  changes or ringing in the ears.  As with any medicine, there is a very remote chance of a vaccine causing a severe allergic reaction, other serious injury, or death. 5. What if there is a serious problem? An allergic reaction could occur after the vaccinated person leaves the clinic. If you see signs of a severe allergic reaction (hives, swelling of the face and throat, difficulty breathing, a fast heartbeat, dizziness, or weakness), call 9-1-1 and get the person to the nearest hospital. For other signs that concern you, call your health care provider.  Adverse reactions should be reported to the Vaccine Adverse Event Reporting System (VAERS). Your health care provider will usually file this report, or you can do it yourself. Visit the VAERS website at www.vaers.LAgents.no or call (570)059-6468. VAERS is only for reporting reactions, and VAERS staff do not give medical advice. 6. The National Vaccine Injury Compensation Program The [employer] Vaccine Injury Compensation Program (VICP) is a federal program that was created to compensate people who may have been injured by certain vaccines. Visit the VICP website at SpiritualWord.at or call 520-326-9385 to learn about the program and about filing a claim. There is a time limit to file a claim for compensation. 7. How can I learn more?  Ask your health care provider.  Call your local or state health department.  Contact the Centers for Disease Control and Prevention (CDC): ? Call 845-247-4174 (1-800-CDC-INFO) or ? Visit CDC's website at PicCapture.uy Vaccine Information Statement Tdap (Tetanus, Diphtheria, Pertussis) Vaccine (01/29/2019) This information is not intended to replace advice given to you by your health care provider. Make sure you discuss any questions you have with your health care provider. Document Revised: 02/07/2019 Document Reviewed: 02/10/2019 Elsevier Patient Education  2020 [employer].

## 2020-04-15 LAB — CBC
Hematocrit: 34.6 % (ref 34.0–46.6)
Hemoglobin: 10.6 g/dL — ABNORMAL LOW (ref 11.1–15.9)
MCH: 23.3 pg — ABNORMAL LOW (ref 26.6–33.0)
MCHC: 30.6 g/dL — ABNORMAL LOW (ref 31.5–35.7)
MCV: 76 fL — ABNORMAL LOW (ref 79–97)
Platelets: 192 10*3/uL (ref 150–450)
RBC: 4.55 x10E6/uL (ref 3.77–5.28)
RDW: 14.5 % (ref 11.7–15.4)
WBC: 6.7 10*3/uL (ref 3.4–10.8)

## 2020-04-15 LAB — GLUCOSE, 1 HOUR GESTATIONAL: Gestational Diabetes Screen: 76 mg/dL (ref 65–139)

## 2020-04-15 LAB — RPR: RPR Ser Ql: NONREACTIVE

## 2020-05-05 ENCOUNTER — Other Ambulatory Visit: Payer: Self-pay

## 2020-05-05 ENCOUNTER — Ambulatory Visit (INDEPENDENT_AMBULATORY_CARE_PROVIDER_SITE_OTHER): Payer: Medicaid Other | Admitting: Obstetrics and Gynecology

## 2020-05-05 ENCOUNTER — Encounter: Payer: Self-pay | Admitting: Obstetrics and Gynecology

## 2020-05-05 VITALS — BP 95/59 | HR 90 | Wt 209.9 lb

## 2020-05-05 DIAGNOSIS — Z308 Encounter for other contraceptive management: Secondary | ICD-10-CM

## 2020-05-05 DIAGNOSIS — Z3483 Encounter for supervision of other normal pregnancy, third trimester: Secondary | ICD-10-CM

## 2020-05-05 DIAGNOSIS — G479 Sleep disorder, unspecified: Secondary | ICD-10-CM

## 2020-05-05 DIAGNOSIS — Z3A31 31 weeks gestation of pregnancy: Secondary | ICD-10-CM

## 2020-05-05 LAB — POCT URINALYSIS DIPSTICK OB
Bilirubin, UA: NEGATIVE
Blood, UA: NEGATIVE
Glucose, UA: NEGATIVE
Ketones, UA: NEGATIVE
Nitrite, UA: NEGATIVE
POC,PROTEIN,UA: NEGATIVE
Spec Grav, UA: 1.015 (ref 1.010–1.025)
Urobilinogen, UA: 0.2 E.U./dL
pH, UA: 7.5 (ref 5.0–8.0)

## 2020-05-05 NOTE — Progress Notes (Signed)
ROB: Still noting issues with sleeping due to fetal movement at night.  Discussed options of using Unisom, chamomile or lavender tea, or taking warm baths in the evening.  Discussed contraception, patient still unsure of desires notes that her partner would like for her to have a tubal ligation however she is unsure.  However she does note that she does not desire any more children.  Has other  LARC options (however patient does not like the idea of the Nexplanon, and has had 2 Mirena IUDs that were expelled and a third IUD that remained in place, however broke on removal).  Given information on copper IUD as well as permanent sterilization.  Medicaid tubal papers signed today in case patient does ultimately decide on permanent sterilization.  She also expresses concern about the size of her baby, however fundal height appropriate today.  Patient will have an ultrasound at term for assessment of fetal size due to her history of macrosomia and a prior pregnancy.  Normal 28-week labs noted.  RTC for OB appt in 3 weeks.

## 2020-05-05 NOTE — Progress Notes (Signed)
ROB-Pt present for routine prenatal care. Pt c/o lower abd pressure and braxton hick contractions twice a week.

## 2020-05-05 NOTE — Patient Instructions (Signed)
Third Trimester of Pregnancy The third trimester is from week 28 through week 40 (months 7 through 9). The third trimester is a time when the unborn baby (fetus) is growing rapidly. At the end of the ninth month, the fetus is about 20 inches in length and weighs 6-10 pounds. Body changes during your third trimester Your body will continue to go through many changes during pregnancy. The changes vary from woman to woman. During the third trimester:  Your weight will continue to increase. You can expect to gain 25-35 pounds (11-16 kg) by the end of the pregnancy.  You may begin to get stretch marks on your hips, abdomen, and breasts.  You may urinate more often because the fetus is moving lower into your pelvis and pressing on your bladder.  You may develop or continue to have heartburn. This is caused by increased hormones that slow down muscles in the digestive tract.  You may develop or continue to have constipation because increased hormones slow digestion and cause the muscles that push waste through your intestines to relax.  You may develop hemorrhoids. These are swollen veins (varicose veins) in the rectum that can itch or be painful.  You may develop swollen, bulging veins (varicose veins) in your legs.  You may have increased body aches in the pelvis, back, or thighs. This is due to weight gain and increased hormones that are relaxing your joints.  You may have changes in your hair. These can include thickening of your hair, rapid growth, and changes in texture. Some women also have hair loss during or after pregnancy, or hair that feels dry or thin. Your hair will most likely return to normal after your baby is born.  Your breasts will continue to grow and they will continue to become tender. A yellow fluid (colostrum) may leak from your breasts. This is the first milk you are producing for your baby.  Your belly button may stick out.  You may notice more swelling in your hands,  face, or ankles.  You may have increased tingling or numbness in your hands, arms, and legs. The skin on your belly may also feel numb.  You may feel short of breath because of your expanding uterus.  You may have more problems sleeping. This can be caused by the size of your belly, increased need to urinate, and an increase in your body's metabolism.  You may notice the fetus "dropping," or moving lower in your abdomen (lightening).  You may have increased vaginal discharge.  You may notice your joints feel loose and you may have pain around your pelvic bone. What to expect at prenatal visits You will have prenatal exams every 2 weeks until week 36. Then you will have weekly prenatal exams. During a routine prenatal visit:  You will be weighed to make sure you and the baby are growing normally.  Your blood pressure will be taken.  Your abdomen will be measured to track your baby's growth.  The fetal heartbeat will be listened to.  Any test results from the previous visit will be discussed.  You may have a cervical check near your due date to see if your cervix has softened or thinned (effaced).  You will be tested for Group B streptococcus. This happens between 35 and 37 weeks. Your health care provider may ask you:  What your birth plan is.  How you are feeling.  If you are feeling the baby move.  If you have had any abnormal   symptoms, such as leaking fluid, bleeding, severe headaches, or abdominal cramping.  If you are using any tobacco products, including cigarettes, chewing tobacco, and electronic cigarettes.  If you have any questions. Other tests or screenings that may be performed during your third trimester include:  Blood tests that check for low iron levels (anemia).  Fetal testing to check the health, activity level, and growth of the fetus. Testing is done if you have certain medical conditions or if there are problems during the pregnancy.  Nonstress test  (NST). This test checks the health of your baby to make sure there are no signs of problems, such as the baby not getting enough oxygen. During this test, a belt is placed around your belly. The baby is made to move, and its heart rate is monitored during movement. What is false labor? False labor is a condition in which you feel small, irregular tightenings of the muscles in the womb (contractions) that usually go away with rest, changing position, or drinking water. These are called Braxton Hicks contractions. Contractions may last for hours, days, or even weeks before true labor sets in. If contractions come at regular intervals, become more frequent, increase in intensity, or become painful, you should see your health care provider. What are the signs of labor?  Abdominal cramps.  Regular contractions that start at 10 minutes apart and become stronger and more frequent with time.  Contractions that start on the top of the uterus and spread down to the lower abdomen and back.  Increased pelvic pressure and dull back pain.  A watery or bloody mucus discharge that comes from the vagina.  Leaking of amniotic fluid. This is also known as your "water breaking." It could be a slow trickle or a gush. Let your health care provider know if it has a color or strange odor. If you have any of these signs, call your health care provider right away, even if it is before your due date. Follow these instructions at home: Medicines  Follow your health care provider's instructions regarding medicine use. Specific medicines may be either safe or unsafe to take during pregnancy.  Take a prenatal vitamin that contains at least 600 micrograms (mcg) of folic acid.  If you develop constipation, try taking a stool softener if your health care provider approves. Eating and drinking   Eat a balanced diet that includes fresh fruits and vegetables, whole grains, good sources of protein such as meat, eggs, or tofu,  and low-fat dairy. Your health care provider will help you determine the amount of weight gain that is right for you.  Avoid raw meat and uncooked cheese. These carry germs that can cause birth defects in the baby.  If you have low calcium intake from food, talk to your health care provider about whether you should take a daily calcium supplement.  Eat four or five small meals rather than three large meals a day.  Limit foods that are high in fat and processed sugars, such as fried and sweet foods.  To prevent constipation: ? Drink enough fluid to keep your urine clear or pale yellow. ? Eat foods that are high in fiber, such as fresh fruits and vegetables, whole grains, and beans. Activity  Exercise only as directed by your health care provider. Most women can continue their usual exercise routine during pregnancy. Try to exercise for 30 minutes at least 5 days a week. Stop exercising if you experience uterine contractions.  Avoid heavy lifting.  Do   not exercise in extreme heat or humidity, or at high altitudes.  Wear low-heel, comfortable shoes.  Practice good posture.  You may continue to have sex unless your health care provider tells you otherwise. Relieving pain and discomfort  Take frequent breaks and rest with your legs elevated if you have leg cramps or low back pain.  Take warm sitz baths to soothe any pain or discomfort caused by hemorrhoids. Use hemorrhoid cream if your health care provider approves.  Wear a good support bra to prevent discomfort from breast tenderness.  If you develop varicose veins: ? Wear support pantyhose or compression stockings as told by your healthcare provider. ? Elevate your feet for 15 minutes, 3-4 times a day. Prenatal care  Write down your questions. Take them to your prenatal visits.  Keep all your prenatal visits as told by your health care provider. This is important. Safety  Wear your seat belt at all times when driving.  Make  a list of emergency phone numbers, including numbers for family, friends, the hospital, and police and fire departments. General instructions  Avoid cat litter boxes and soil used by cats. These carry germs that can cause birth defects in the baby. If you have a cat, ask someone to clean the litter box for you.  Do not travel far distances unless it is absolutely necessary and only with the approval of your health care provider.  Do not use hot tubs, steam rooms, or saunas.  Do not drink alcohol.  Do not use any products that contain nicotine or tobacco, such as cigarettes and e-cigarettes. If you need help quitting, ask your health care provider.  Do not use any medicinal herbs or unprescribed drugs. These chemicals affect the formation and growth of the baby.  Do not douche or use tampons or scented sanitary pads.  Do not cross your legs for long periods of time.  To prepare for the arrival of your baby: ? Take prenatal classes to understand, practice, and ask questions about labor and delivery. ? Make a trial run to the hospital. ? Visit the hospital and tour the maternity area. ? Arrange for maternity or paternity leave through employers. ? Arrange for family and friends to take care of pets while you are in the hospital. ? Purchase a rear-facing car seat and make sure you know how to install it in your car. ? Pack your hospital bag. ? Prepare the baby's nursery. Make sure to remove all pillows and stuffed animals from the baby's crib to prevent suffocation.  Visit your dentist if you have not gone during your pregnancy. Use a soft toothbrush to brush your teeth and be gentle when you floss. Contact a health care provider if:  You are unsure if you are in labor or if your water has broken.  You become dizzy.  You have mild pelvic cramps, pelvic pressure, or nagging pain in your abdominal area.  You have lower back pain.  You have persistent nausea, vomiting, or  diarrhea.  You have an unusual or bad smelling vaginal discharge.  You have pain when you urinate. Get help right away if:  Your water breaks before 37 weeks.  You have regular contractions less than 5 minutes apart before 37 weeks.  You have a fever.  You are leaking fluid from your vagina.  You have spotting or bleeding from your vagina.  You have severe abdominal pain or cramping.  You have rapid weight loss or weight gain.  You have   shortness of breath with chest pain.  You notice sudden or extreme swelling of your face, hands, ankles, feet, or legs.  Your baby makes fewer than 10 movements in 2 hours.  You have severe headaches that do not go away when you take medicine.  You have vision changes. Summary  The third trimester is from week 28 through week 40, months 7 through 9. The third trimester is a time when the unborn baby (fetus) is growing rapidly.  During the third trimester, your discomfort may increase as you and your baby continue to gain weight. You may have abdominal, leg, and back pain, sleeping problems, and an increased need to urinate.  During the third trimester your breasts will keep growing and they will continue to become tender. A yellow fluid (colostrum) may leak from your breasts. This is the first milk you are producing for your baby.  False labor is a condition in which you feel small, irregular tightenings of the muscles in the womb (contractions) that eventually go away. These are called Braxton Hicks contractions. Contractions may last for hours, days, or even weeks before true labor sets in.  Signs of labor can include: abdominal cramps; regular contractions that start at 10 minutes apart and become stronger and more frequent with time; watery or bloody mucus discharge that comes from the vagina; increased pelvic pressure and dull back pain; and leaking of amniotic fluid. This information is not intended to replace advice given to you by your  health care provider. Make sure you discuss any questions you have with your health care provider. Document Revised: 02/06/2019 Document Reviewed: 11/21/2016 Elsevier Patient Education  2020 [employer]  

## 2020-05-26 ENCOUNTER — Ambulatory Visit (INDEPENDENT_AMBULATORY_CARE_PROVIDER_SITE_OTHER): Payer: Medicaid Other | Admitting: Obstetrics and Gynecology

## 2020-05-26 ENCOUNTER — Encounter: Payer: Self-pay | Admitting: Obstetrics and Gynecology

## 2020-05-26 VITALS — BP 98/63 | HR 90 | Wt 210.9 lb

## 2020-05-26 DIAGNOSIS — Z3A34 34 weeks gestation of pregnancy: Secondary | ICD-10-CM

## 2020-05-26 DIAGNOSIS — Z3483 Encounter for supervision of other normal pregnancy, third trimester: Secondary | ICD-10-CM | POA: Diagnosis not present

## 2020-05-26 LAB — POCT URINALYSIS DIPSTICK OB
Bilirubin, UA: NEGATIVE
Glucose, UA: NEGATIVE
Ketones, UA: NEGATIVE
Leukocytes, UA: NEGATIVE
Nitrite, UA: NEGATIVE
POC,PROTEIN,UA: NEGATIVE
Spec Grav, UA: 1.01 (ref 1.010–1.025)
Urobilinogen, UA: 0.2 E.U./dL
pH, UA: 7 (ref 5.0–8.0)

## 2020-05-26 NOTE — Addendum Note (Signed)
Addended by: Dorian Pod on: 05/26/2020 11:28 AM   Modules accepted: Orders

## 2020-05-26 NOTE — Progress Notes (Signed)
Blood noted in urine.  Patient is completely asymptomatic.  Patient states she had intercourse late last night.  Sent for C&S.

## 2020-05-26 NOTE — Progress Notes (Signed)
ROB: No complaints today.  Patient still considering birth control methods tubal ligation versus IUD discussed.  Cultures next visit.

## 2020-05-28 LAB — URINE CULTURE

## 2020-05-31 ENCOUNTER — Telehealth: Payer: Self-pay | Admitting: Obstetrics and Gynecology

## 2020-05-31 ENCOUNTER — Observation Stay
Admission: EM | Admit: 2020-05-31 | Discharge: 2020-05-31 | Disposition: A | Discharge status: Home / Self Care | Payer: Medicaid Other | Attending: Obstetrics and Gynecology | Admitting: Obstetrics and Gynecology

## 2020-05-31 ENCOUNTER — Other Ambulatory Visit: Payer: Self-pay

## 2020-05-31 DIAGNOSIS — Z3A35 35 weeks gestation of pregnancy: Secondary | ICD-10-CM | POA: Diagnosis not present

## 2020-05-31 DIAGNOSIS — O4703 False labor before 37 completed weeks of gestation, third trimester: Principal | ICD-10-CM | POA: Insufficient documentation

## 2020-05-31 NOTE — Telephone Encounter (Signed)
Pt called in and stated that she is having contractions, some are strong and some last up to 10 mins. She stated that she lost her mucus plug last week. She was requesting to be seen today I told her that both provider are out of the office today. That I will call back to the nurse. I called back to JW and she stated the she just clocked out for lunch, to call FH. I called to FH and she is going to lunch too, I told her what was going on and she requested I send JW a message. The pt is requesting a call back. Please advise

## 2020-05-31 NOTE — OB Triage Note (Signed)
Pt states " I have been having ctx since 5am this morning, They woke me up out of a dead sleep. I have drank water, I've tried different positions. Nothing makes them better or worse." They have been anywhere from 10-15 mins apart. Denies any vaginal bleeding or leaking of fluid. Feeling baby move all the time.

## 2020-05-31 NOTE — Telephone Encounter (Signed)
Spoke with patient and she has been having contractions that are 10-15 minutes apart. Patient stated that the contractions are doubling her over. I let the patient know that neither provider is in the office today. Patient stated her husband was taking her to the hospital at this time to be checked out. I told her to let us know if she needed anything.

## 2020-06-01 NOTE — Discharge Summary (Signed)
    L&D OB Triage Note  SUBJECTIVE Hannah Ball is a 29 y.o. G57P3003 female at [redacted]w[redacted]d, EDD Estimated Date of Delivery: 07/05/20 who presented to triage with complaints of contractions that awoke her from sleep.  She states they are 10 to 15 minutes apart.  She denies bleeding or rupture of membranes.  Reports fetal movement..   OB History  Gravida Para Term Preterm AB Living  4 3 3  0 0 3  SAB TAB Ectopic Multiple Live Births  0 0 0 0 3    # Outcome Date GA Lbr Len/2nd Weight Sex Delivery Anes PTL Lv  4 Current           3 Term 04/13/18 [redacted]w[redacted]d 138:05 / 00:05 3790 g F Vag-Spont EPI  LIV     Name: YOUNG-GARCIA,GIRL [place]     Apgar1: 8  Apgar5: 9  2 Term 05/09/17 [redacted]w[redacted]d 11:49 / 02:13 4880 g M Vag-Vacuum EPI  LIV     Birth Comments: WNL     Name: YOUNG-GARCIA,BOY Kelsay     Apgar1: 8  Apgar5: 9  1 Term 2013   3544 g M Vag-Spont EPI  LIV     Complications: Ovarian cyst during pregnancy    No medications prior to admission.     OBJECTIVE  Nursing Evaluation:   BP (!) 99/58 (BP Location: Left Arm)   Pulse (!) 106   Temp 98 F (36.7 C) (Oral)   Resp 14   LMP 09/29/2019    Findings:        No contractions on NST.     No evidence of labor.      NST was performed and has been reviewed by me.  NST INTERPRETATION: Category I  Mode: External Baseline Rate (A): 115 bpm Variability: Moderate Accelerations: 15 x 15 Decelerations: None     Contraction Frequency (min): none  ASSESSMENT Impression:  1.  Pregnancy:  10/01/2019 at [redacted]w[redacted]d , EDD Estimated Date of Delivery: 07/05/20 2.  Reassuring fetal and maternal status 3.  Can find no evidence of labor/contractions.  PLAN 1. Discussed current condition and above findings with patient and reassurance given.  All questions answered. 2. Discharge home with standard labor precautions given to return to L&D or call the office for problems. 3. Continue routine prenatal care.

## 2020-06-09 ENCOUNTER — Encounter: Payer: Self-pay | Admitting: Obstetrics and Gynecology

## 2020-06-09 ENCOUNTER — Ambulatory Visit (INDEPENDENT_AMBULATORY_CARE_PROVIDER_SITE_OTHER): Payer: Medicaid Other | Admitting: Obstetrics and Gynecology

## 2020-06-09 VITALS — BP 100/65 | HR 98 | Wt 214.0 lb

## 2020-06-09 DIAGNOSIS — R102 Pelvic and perineal pain: Secondary | ICD-10-CM

## 2020-06-09 DIAGNOSIS — O26899 Other specified pregnancy related conditions, unspecified trimester: Secondary | ICD-10-CM

## 2020-06-09 DIAGNOSIS — Z3A36 36 weeks gestation of pregnancy: Secondary | ICD-10-CM

## 2020-06-09 DIAGNOSIS — O09299 Supervision of pregnancy with other poor reproductive or obstetric history, unspecified trimester: Secondary | ICD-10-CM

## 2020-06-09 DIAGNOSIS — Z3483 Encounter for supervision of other normal pregnancy, third trimester: Secondary | ICD-10-CM

## 2020-06-09 LAB — POCT URINALYSIS DIPSTICK OB
Bilirubin, UA: NEGATIVE
Glucose, UA: NEGATIVE
Ketones, UA: NEGATIVE
Leukocytes, UA: NEGATIVE
Nitrite, UA: NEGATIVE
Spec Grav, UA: 1.025 (ref 1.010–1.025)
Urobilinogen, UA: 0.2 E.U./dL
pH, UA: 6 (ref 5.0–8.0)

## 2020-06-09 NOTE — Progress Notes (Signed)
ROB: Patient states that she is "over being pregnant". Noting some back pain and pelvic pain.  Becoming more difficult to sleep.  Can utilize Tylenol, warm baths, Benadryl. 36 week cultures done. Given handout on natural remedies for cervical ripening. RTC in 1 week.

## 2020-06-09 NOTE — Progress Notes (Signed)
ROB-Pt present for routine prenatal care and 36 week labs. Pt c/o of lower back and abd pain and vaginal discharge.

## 2020-06-11 LAB — GC/CHLAMYDIA PROBE AMP
Chlamydia trachomatis, NAA: NEGATIVE
Neisseria Gonorrhoeae by PCR: NEGATIVE

## 2020-06-11 LAB — STREP GP B NAA+RFLX: Strep Gp B NAA+Rflx: NEGATIVE

## 2020-06-16 ENCOUNTER — Other Ambulatory Visit: Payer: Self-pay

## 2020-06-16 ENCOUNTER — Ambulatory Visit (INDEPENDENT_AMBULATORY_CARE_PROVIDER_SITE_OTHER): Payer: Medicaid Other | Admitting: Obstetrics and Gynecology

## 2020-06-16 ENCOUNTER — Encounter: Payer: Self-pay | Admitting: Obstetrics and Gynecology

## 2020-06-16 VITALS — BP 98/66 | HR 98 | Wt 213.8 lb

## 2020-06-16 DIAGNOSIS — Z3483 Encounter for supervision of other normal pregnancy, third trimester: Secondary | ICD-10-CM

## 2020-06-16 DIAGNOSIS — Z3A37 37 weeks gestation of pregnancy: Secondary | ICD-10-CM

## 2020-06-16 NOTE — Progress Notes (Signed)
ROB: Having some contractions but no closer than 20 minutes.  States that she continues to have difficulty sleeping.  Reports daily fetal movement.

## 2020-06-21 ENCOUNTER — Encounter: Payer: Self-pay | Admitting: Obstetrics and Gynecology

## 2020-06-21 ENCOUNTER — Observation Stay
Admission: EM | Admit: 2020-06-21 | Discharge: 2020-06-21 | Disposition: A | Discharge status: Home / Self Care | Payer: Medicaid Other | Attending: Obstetrics and Gynecology | Admitting: Obstetrics and Gynecology

## 2020-06-21 DIAGNOSIS — Z91040 Latex allergy status: Secondary | ICD-10-CM | POA: Insufficient documentation

## 2020-06-21 DIAGNOSIS — Z3A38 38 weeks gestation of pregnancy: Secondary | ICD-10-CM | POA: Diagnosis not present

## 2020-06-21 DIAGNOSIS — R110 Nausea: Secondary | ICD-10-CM | POA: Diagnosis not present

## 2020-06-21 DIAGNOSIS — O26893 Other specified pregnancy related conditions, third trimester: Principal | ICD-10-CM | POA: Insufficient documentation

## 2020-06-21 DIAGNOSIS — R109 Unspecified abdominal pain: Secondary | ICD-10-CM | POA: Diagnosis not present

## 2020-06-21 DIAGNOSIS — K529 Noninfective gastroenteritis and colitis, unspecified: Secondary | ICD-10-CM

## 2020-06-21 DIAGNOSIS — G479 Sleep disorder, unspecified: Secondary | ICD-10-CM | POA: Diagnosis not present

## 2020-06-21 DIAGNOSIS — Z8759 Personal history of other complications of pregnancy, childbirth and the puerperium: Secondary | ICD-10-CM | POA: Insufficient documentation

## 2020-06-21 DIAGNOSIS — O99613 Diseases of the digestive system complicating pregnancy, third trimester: Secondary | ICD-10-CM

## 2020-06-21 DIAGNOSIS — R197 Diarrhea, unspecified: Secondary | ICD-10-CM | POA: Diagnosis not present

## 2020-06-21 DIAGNOSIS — O99353 Diseases of the nervous system complicating pregnancy, third trimester: Secondary | ICD-10-CM

## 2020-06-21 LAB — URINALYSIS, ROUTINE W REFLEX MICROSCOPIC
Bacteria, UA: NONE SEEN
Bilirubin Urine: NEGATIVE
Glucose, UA: NEGATIVE mg/dL
Ketones, ur: NEGATIVE mg/dL
Nitrite: NEGATIVE
Protein, ur: NEGATIVE mg/dL
Specific Gravity, Urine: 1.015 (ref 1.005–1.030)
pH: 7 (ref 5.0–8.0)

## 2020-06-21 MED ORDER — HYDROXYZINE HCL 25 MG PO TABS: 25.0000 mg | ORAL_TABLET | Freq: Every evening | ORAL | 0 refills | Status: DC | PRN

## 2020-06-21 MED ORDER — ONDANSETRON HCL 4 MG PO TABS: 4.0000 mg | ORAL_TABLET | Freq: Once | ORAL | Status: DC

## 2020-06-21 MED ORDER — ACETAMINOPHEN 325 MG PO TABS
ORAL_TABLET | ORAL | Status: AC
  Administered 2020-06-21: 650 mg via ORAL
  Filled 2020-06-21: qty 2

## 2020-06-21 MED ORDER — ONDANSETRON 4 MG PO TBDP: 4.0000 mg | ORAL_TABLET | Freq: Once | ORAL | Status: AC

## 2020-06-21 MED ORDER — ONDANSETRON 4 MG PO TBDP
ORAL_TABLET | ORAL | Status: AC
  Administered 2020-06-21: 4 mg via ORAL
  Filled 2020-06-21: qty 1

## 2020-06-21 MED ORDER — ACETAMINOPHEN 325 MG PO TABS: 650.0000 mg | ORAL_TABLET | Freq: Once | ORAL | Status: AC

## 2020-06-21 NOTE — OB Triage Note (Signed)
Patient reported to L&D for evaluation with complaints of right/ left sided abdominal pain that started yesterday.  Denies leaking of fluid, vaginal bleeding or decreased fetal movement. Reports feeling contractions for some time now.

## 2020-06-21 NOTE — Progress Notes (Deleted)
L&D OB Triage Note  HPI:  Hannah Ball is a 29 y.o. [phone] female at [redacted]w[redacted]d Estimated Date of Delivery: 07/05/20 who presents for complaints of right-sided abdominal pain, nausea, and diarrhea (2 episodes) today. Denies eating anything unusual.  Denies any issues with urination. Denies vaginal bleeding, contractions, decreased fetal movement. No recent sick contacts.   OB History  Gravida Para Term Preterm AB Living  _0 0 0 3  SAB TAB Ectopic Multiple Live Births  0 0 0 0 3    # Outcome Date GA Lbr Len/2nd Weight Sex Delivery Anes PTL Lv  4 Current           3 Term 04/13/18 328w1d38:05 / 00:05 3790 g F Vag-Spont EPI  LIV  2 Term 05/09/17 4079w3d:49 / 02:13 4880 g M Vag-Vacuum EPI  LIV     Birth Comments: WNL  1 Term 2013   3544 g M Vag-Spont EPI  LIV     Complications: Ovarian cyst during pregnancy    Patient Active Problem List   Diagnosis Date Noted  . Labor and delivery indication for care or intervention 06/21/2020  . Indication for care in labor or delivery 03/11/2020  . COVID-19 12/07/2019  . History of macrosomia in infant in prior pregnancy, currently pregnant 09/26/2017  . Rubella non-immune status, antepartum 08/14/2017  . Rh negative, antepartum 10/16/2016    Past Medical History:  Diagnosis Date  . Migraines   . Nausea/vomiting in pregnancy 10/26/2016   Trial of diclegis   . Ovarian cyst   . Rubella non-immune status, antepartum 08/14/2017   Needs MMR PP  . Type A blood, Rh negative     No current facility-administered medications on file prior to encounter.   Current Outpatient Medications on File Prior to Encounter  Medication Sig Dispense Refill  . acetaminophen (TYLENOL) 325 MG tablet Take 650 mg by mouth every 6 (six) hours as needed for headache.    . Prenatal Vit-Fe Fumarate-FA (PRENATAL VITAMIN PO) Take by mouth.      Allergies  Allergen Reactions  . Benadryl [Diphenhydramine Hcl (Sleep)] Hives  . Latex Hives  . Neosporin Plus  Max St Other (See Comments)    Skin burns  . Neosporin Wound Cleanser [Benzalkonium Chloride] Hives  . Penicillins Rash    Has patient had a PCN reaction causing immediate rash, facial/tongue/throat swelling, SOB or lightheadedness with hypotension: Yes Has patient had a PCN reaction causing severe rash involving mucus membranes or skin necrosis: No Has patient had a PCN reaction that required hospitalization No Has patient had a PCN reaction occurring within the last 10 years: No If all of the above answers are "NO", then may proceed with Cephalosporin use.      ROS:  Review of Systems - Negative except what is noted in HPI, and sleep issues.  Notes difficulty staying asleep at night.    Physical Exam:  Blood pressure 100/61, pulse 87, temperature 98.2 F (36.8 C), temperature source Oral, resp. rate 19, height 5' 9" (1.753 m), weight 92.5 kg, last menstrual period 09/29/2019. General appearance: alert and no distress Abdomen: soft, non-tender; bowel sounds normal; no masses,  no organomegaly Pelvic: Dilation: Fingertip Effacement (%): 20, 30 Cervical Position: Posterior Station: Ballotable Exam by:: M.Newton, RN Extremities: extremities normal, atraumatic, no cyanosis or edema   NST INTERPRETATION: Indications: rule out uterine contractions  Mode: External Baseline Rate (A): 120 bpm Variability: Moderate Accelerations: 15 x 15 Decelerations: None  Contraction Frequency (min): occasional, irregular  Impression: reactive    Labs:  Results for orders placed or performed during the hospital encounter of 06/21/20  Urinalysis, Routine w reflex microscopic Urine, Clean Catch  Result Value Ref Range   Color, Urine YELLOW (A) YELLOW   APPearance CLOUDY (A) CLEAR   Specific Gravity, Urine 1.015 1.005 - 1.030   pH 7.0 5.0 - 8.0   Glucose, UA NEGATIVE NEGATIVE mg/dL   Hgb urine dipstick SMALL (A) NEGATIVE   Bilirubin Urine NEGATIVE NEGATIVE   Ketones, ur NEGATIVE  NEGATIVE mg/dL   Protein, ur NEGATIVE NEGATIVE mg/dL   Nitrite NEGATIVE NEGATIVE   Leukocytes,Ua LARGE (A) NEGATIVE   RBC / HPF 6-10 0 - 5 RBC/hpf   WBC, UA 11-20 0 - 5 WBC/hpf   Bacteria, UA NONE SEEN NONE SEEN   Squamous Epithelial / LPF 11-20 0 - 5   Mucus PRESENT     Assessment:  28 y.o. G4P3003 at [redacted]w[redacted]d with:  1.  Gastroenteritis symptoms 2. Abdominal pain   Plan:  1. Given Zofran, bismuth, for symptoms.  2. Given Tylenol for pain.  3. Given abdominal binder.  4. Prescribed Vistaril for sleep issues and likely uterine irritability.   To discharge home, follow up in 1 day for routine OB visit.   Cherry, Anika, MD [facility]   

## 2020-06-22 ENCOUNTER — Ambulatory Visit (INDEPENDENT_AMBULATORY_CARE_PROVIDER_SITE_OTHER): Payer: Medicaid Other | Admitting: Obstetrics and Gynecology

## 2020-06-22 ENCOUNTER — Encounter: Payer: Self-pay | Admitting: Obstetrics and Gynecology

## 2020-06-22 ENCOUNTER — Other Ambulatory Visit: Payer: Self-pay

## 2020-06-22 VITALS — BP 96/60 | HR 74 | Wt 213.7 lb

## 2020-06-22 DIAGNOSIS — Z3483 Encounter for supervision of other normal pregnancy, third trimester: Secondary | ICD-10-CM

## 2020-06-22 DIAGNOSIS — R109 Unspecified abdominal pain: Secondary | ICD-10-CM

## 2020-06-22 DIAGNOSIS — Z3A38 38 weeks gestation of pregnancy: Secondary | ICD-10-CM | POA: Diagnosis not present

## 2020-06-22 DIAGNOSIS — O26893 Other specified pregnancy related conditions, third trimester: Secondary | ICD-10-CM

## 2020-06-22 LAB — POCT URINALYSIS DIPSTICK OB
Bilirubin, UA: NEGATIVE
Blood, UA: NEGATIVE
Glucose, UA: NEGATIVE
Ketones, UA: NEGATIVE
Nitrite, UA: NEGATIVE
POC,PROTEIN,UA: NEGATIVE
Spec Grav, UA: 1.02 (ref 1.010–1.025)
Urobilinogen, UA: 0.2 E.U./dL
pH, UA: 6 (ref 5.0–8.0)

## 2020-06-22 NOTE — Final Progress Note (Signed)
L&D OB Triage Note  HPI:  Hannah Ball is a 29 y.o. [phone] female at [redacted]w[redacted]d Estimated Date of Delivery: 07/05/20 who presents for complaints of right-sided abdominal pain, nausea, and diarrhea (2 episodes) today. Denies eating anything unusual.  Denies any issues with urination. Denies vaginal bleeding, contractions, decreased fetal movement. No recent sick contacts.   OB History  Gravida Para Term Preterm AB Living  4 3 3  0 0 3  SAB TAB Ectopic Multiple Live Births  0 0 0 0 3    # Outcome Date GA Lbr Len/2nd Weight Sex Delivery Anes PTL Lv  4 Current           3 Term 04/13/18 39w1d38:05 / 00:05 3790 g F Vag-Spont EPI  LIV  2 Term 05/09/17 4041w3d:49 / 02:13 4880 g M Vag-Vacuum EPI  LIV     Birth Comments: WNL  1 Term 2013   3544 g M Vag-Spont EPI  LIV     Complications: Ovarian cyst during pregnancy    Patient Active Problem List   Diagnosis Date Noted  . Labor and delivery indication for care or intervention 06/21/2020  . Indication for care in labor or delivery 03/11/2020  . COVID-19 12/07/2019  . History of macrosomia in infant in prior pregnancy, currently pregnant 09/26/2017  . Rubella non-immune status, antepartum 08/14/2017  . Rh negative, antepartum 10/16/2016    Past Medical History:  Diagnosis Date  . Migraines   . Nausea/vomiting in pregnancy 10/26/2016   Trial of diclegis   . Ovarian cyst   . Rubella non-immune status, antepartum 08/14/2017   Needs MMR PP  . Type A blood, Rh negative     No current facility-administered medications on file prior to encounter.   No current outpatient medications on file prior to encounter.    Allergies  Allergen Reactions  . Benadryl [Diphenhydramine Hcl (Sleep)] Hives  . Latex Hives  . Neosporin Plus Max St Other (See Comments)    Skin burns  . Neosporin Wound Cleanser [Benzalkonium Chloride] Hives  . Penicillins Rash    Has patient had a PCN reaction causing immediate rash, facial/tongue/throat  swelling, SOB or lightheadedness with hypotension: Yes Has patient had a PCN reaction causing severe rash involving mucus membranes or skin necrosis: No Has patient had a PCN reaction that required hospitalization No Has patient had a PCN reaction occurring within the last 10 years: No If all of the above answers are "NO", then may proceed with Cephalosporin use.      ROS:  Review of Systems - Negative except what is noted in HPI, and sleep issues.  Notes difficulty staying asleep at night.    Physical Exam:  Blood pressure 98/76, pulse 67, temperature 98 F (36.7 C), temperature source Oral, resp. rate 17, height 5' 9"  (1.753 m), weight 92.5 kg, last menstrual period 09/29/2019. General appearance: alert and no distress Abdomen: soft, non-tender; bowel sounds normal; no masses,  no organomegaly Pelvic: Dilation: Fingertip Effacement (%): 20, 30 Cervical Position: Posterior Station: Ballotable Exam by:: M.Newton, RN Extremities: extremities normal, atraumatic, no cyanosis or edema   NST INTERPRETATION: Indications: rule out uterine contractions  Mode: External Baseline Rate (A): 120 bpm Variability: Moderate Accelerations: 15 x 15 Decelerations: None     Contraction Frequency (min): occasional  Impression: reactive    Labs:  Results for orders placed or performed during the hospital encounter of 06/21/20  Urinalysis, Routine w reflex microscopic Urine, Clean Catch  Result Value Ref Range  Color, Urine YELLOW (A) YELLOW   APPearance CLOUDY (A) CLEAR   Specific Gravity, Urine 1.015 1.005 - 1.030   pH 7.0 5.0 - 8.0   Glucose, UA NEGATIVE NEGATIVE mg/dL   Hgb urine dipstick SMALL (A) NEGATIVE   Bilirubin Urine NEGATIVE NEGATIVE   Ketones, ur NEGATIVE NEGATIVE mg/dL   Protein, ur NEGATIVE NEGATIVE mg/dL   Nitrite NEGATIVE NEGATIVE   Leukocytes,Ua LARGE (A) NEGATIVE   RBC / HPF 6-10 0 - 5 RBC/hpf   WBC, UA 11-20 0 - 5 WBC/hpf   Bacteria, UA NONE SEEN NONE SEEN    Squamous Epithelial / LPF 11-20 0 - 5   Mucus PRESENT     Assessment:  29 y.o. [id number] at 26w0dwith:  1.  Gastroenteritis symptoms 2. Abdominal pain   Plan:  1. Given Zofran, bismuth, for symptoms.  2. Given Tylenol for pain.  3. Given abdominal binder.  4. Prescribed Vistaril for sleep issues and likely uterine irritability.   To discharge home, follow up in 1 day for routine OB visit.   CRubie Maid MD [facility]

## 2020-06-22 NOTE — Progress Notes (Signed)
ROB-Pt present for routine prenatal care. Pt c/o of lower abd/pelvic/groin area pain.

## 2020-06-22 NOTE — Progress Notes (Signed)
ROB: Patient seen in triage yesterday for complaints of abdominal pain and diarrhea.  Today still noting some lower abdominal/pelvic/groin pain.  Was unable to pick up her Vistaril prescription to help with sleep and uterine irritability due to unavailability at the pharmacy. Will try to pick up today. Reiterated labor precautions. RTC in 1 week.

## 2020-06-23 ENCOUNTER — Observation Stay
Admission: EM | Admit: 2020-06-23 | Discharge: 2020-06-24 | Disposition: A | Discharge status: Home / Self Care | Payer: Medicaid Other | Attending: Obstetrics and Gynecology | Admitting: Obstetrics and Gynecology

## 2020-06-23 ENCOUNTER — Encounter: Payer: Self-pay | Admitting: Obstetrics and Gynecology

## 2020-06-23 ENCOUNTER — Other Ambulatory Visit: Payer: Self-pay

## 2020-06-23 DIAGNOSIS — Z3490 Encounter for supervision of normal pregnancy, unspecified, unspecified trimester: Secondary | ICD-10-CM

## 2020-06-23 DIAGNOSIS — Z3A38 38 weeks gestation of pregnancy: Secondary | ICD-10-CM | POA: Insufficient documentation

## 2020-06-23 DIAGNOSIS — O4292 Full-term premature rupture of membranes, unspecified as to length of time between rupture and onset of labor: Secondary | ICD-10-CM | POA: Insufficient documentation

## 2020-06-23 NOTE — OB Triage Note (Signed)
Pt is a 29 yo, G4P3. Pt reports that around 8 pm she felt a LOF while standing in [employer]. Pt reports some bloody show with LOF. Pt reports some irregular contractions on and off during the day.

## 2020-06-24 DIAGNOSIS — Z3A38 38 weeks gestation of pregnancy: Secondary | ICD-10-CM

## 2020-06-24 DIAGNOSIS — O4292 Full-term premature rupture of membranes, unspecified as to length of time between rupture and onset of labor: Secondary | ICD-10-CM | POA: Diagnosis not present

## 2020-06-24 DIAGNOSIS — O26893 Other specified pregnancy related conditions, third trimester: Secondary | ICD-10-CM

## 2020-06-24 DIAGNOSIS — Z3490 Encounter for supervision of normal pregnancy, unspecified, unspecified trimester: Secondary | ICD-10-CM

## 2020-06-24 LAB — RUPTURE OF MEMBRANE (ROM)PLUS: Rom Plus: NEGATIVE

## 2020-06-25 ENCOUNTER — Other Ambulatory Visit: Payer: Self-pay

## 2020-06-25 ENCOUNTER — Ambulatory Visit (INDEPENDENT_AMBULATORY_CARE_PROVIDER_SITE_OTHER): Payer: Medicaid Other | Admitting: Obstetrics and Gynecology

## 2020-06-25 ENCOUNTER — Encounter: Payer: Self-pay | Admitting: Obstetrics and Gynecology

## 2020-06-25 VITALS — BP 97/66 | HR 114 | Wt 215.7 lb

## 2020-06-25 DIAGNOSIS — O26893 Other specified pregnancy related conditions, third trimester: Secondary | ICD-10-CM

## 2020-06-25 DIAGNOSIS — R109 Unspecified abdominal pain: Secondary | ICD-10-CM

## 2020-06-25 DIAGNOSIS — Z3483 Encounter for supervision of other normal pregnancy, third trimester: Secondary | ICD-10-CM

## 2020-06-25 DIAGNOSIS — Z3A38 38 weeks gestation of pregnancy: Secondary | ICD-10-CM

## 2020-06-25 LAB — POCT URINALYSIS DIPSTICK OB
Bilirubin, UA: NEGATIVE
Blood, UA: NEGATIVE
Glucose, UA: NEGATIVE
Ketones, UA: NEGATIVE
Leukocytes, UA: NEGATIVE
Nitrite, UA: NEGATIVE
POC,PROTEIN,UA: NEGATIVE
Spec Grav, UA: 1.015 (ref 1.010–1.025)
Urobilinogen, UA: 0.2 E.U./dL
pH, UA: 7.5 (ref 5.0–8.0)

## 2020-06-25 NOTE — Progress Notes (Signed)
Problem OB: Notes complaints of worsening upper abdominal pain, nausea, and back pain.  Feels like baby has turned. Notes she was seen in triage on Wednesday for similar complaints.  Bedside sono confirms cephalic presentation. Noted that pain is more than likely related to fetal movement. RTC in 1 week for routine appointment.

## 2020-06-25 NOTE — Progress Notes (Signed)
ROB-Pt present due to having abd pain.

## 2020-06-25 NOTE — Discharge Summary (Signed)
    L&D OB Triage Note  SUBJECTIVE Hannah Ball is a 29 y.o. G68P3003 female at [redacted]w[redacted]d, EDD Estimated Date of Delivery: 07/05/20 who presented to triage with complaints of contractions, leakage of fluid with possible bloody show.   OB History  Gravida Para Term Preterm AB Living  4 3 3  0 0 3  SAB TAB Ectopic Multiple Live Births  0 0 0 0 3    # Outcome Date GA Lbr Len/2nd Weight Sex Delivery Anes PTL Lv  4 Current           3 Term 04/13/18 [redacted]w[redacted]d 138:05 / 00:05 3790 g F Vag-Spont EPI  LIV     Name: YOUNG-GARCIA,GIRL Debbrah     Apgar1: 8  Apgar5: 9  2 Term 05/09/17 [redacted]w[redacted]d 11:49 / 02:13 4880 g M Vag-Vacuum EPI  LIV     Birth Comments: WNL     Name: YOUNG-GARCIA,BOY Anne     Apgar1: 8  Apgar5: 9  1 Term 2013   3544 g M Vag-Spont EPI  LIV     Complications: Ovarian cyst during pregnancy    No medications prior to admission.     OBJECTIVE  Nursing Evaluation:   BP 104/61   Pulse 82   Temp 98.1 F (36.7 C) (Oral)   Resp 16   Ht 5\' 9"  (1.753 m)   Wt 96.6 kg   LMP 09/29/2019   BMI 31.45 kg/m    Findings:        ROM plus  = negative     Irregular contractions without cervical change.      NST was performed and has been reviewed by me.  NST INTERPRETATION: Category I  Mode: External Baseline Rate (A): 120 bpm (fht) Variability: Moderate Accelerations: 15 x 15 Decelerations: None     Contraction Frequency (min): uterine irritability   ASSESSMENT Impression:  1.  Pregnancy:  at [redacted]w[redacted]d , EDD Estimated Date of Delivery: 07/05/20 2.  Reassuring fetal and maternal status 3.  No evidence of rupture of membranes or labor  PLAN 1. Discussed current condition and above findings with patient and reassurance given.  All questions answered. 2. Discharge home with standard labor precautions given to return to L&D or call the office for problems. 3. Continue routine prenatal care.

## 2020-06-28 ENCOUNTER — Other Ambulatory Visit: Payer: Self-pay

## 2020-06-28 ENCOUNTER — Observation Stay
Admission: EM | Admit: 2020-06-28 | Discharge: 2020-06-28 | Disposition: A | Discharge status: Home / Self Care | Payer: Medicaid Other | Attending: Obstetrics and Gynecology | Admitting: Obstetrics and Gynecology

## 2020-06-28 ENCOUNTER — Encounter: Payer: Self-pay | Admitting: Obstetrics and Gynecology

## 2020-06-28 ENCOUNTER — Telehealth: Payer: Self-pay | Admitting: Obstetrics and Gynecology

## 2020-06-28 DIAGNOSIS — R1084 Generalized abdominal pain: Secondary | ICD-10-CM | POA: Diagnosis not present

## 2020-06-28 DIAGNOSIS — O1203 Gestational edema, third trimester: Secondary | ICD-10-CM

## 2020-06-28 DIAGNOSIS — O99891 Other specified diseases and conditions complicating pregnancy: Principal | ICD-10-CM | POA: Insufficient documentation

## 2020-06-28 DIAGNOSIS — O26893 Other specified pregnancy related conditions, third trimester: Secondary | ICD-10-CM

## 2020-06-28 DIAGNOSIS — Z3A39 39 weeks gestation of pregnancy: Secondary | ICD-10-CM | POA: Insufficient documentation

## 2020-06-28 DIAGNOSIS — R1010 Upper abdominal pain, unspecified: Secondary | ICD-10-CM | POA: Diagnosis not present

## 2020-06-28 NOTE — Telephone Encounter (Signed)
Spoke with patient and she has had swelling in her feet for a couple days. She has tried ice, compression socks, and elevations. I told her to reduce her salt(sodium intake as well. She will continue all of these and call with any other problems. Patient is scheduled with Dr. Valentino Saxon tomorrow.

## 2020-06-28 NOTE — OB Triage Note (Signed)
Patient G4P3 [redacted]w[redacted]d presents to L&D with complaints of swelling in her feet and abdominal discomfort. Patient states she has had some swelling in her feet but has noticed it more recently. She states last night it was so bad that she was unable to sleep. She states they are uncomfortable/tender and have tingles. She reports the left ankle being more swollen and uncomfortable. On assessment, the patient has some nonpitting edema in her bilateral feet/lower ankles. Patient states she has had some discomfort in her upper abdomen. She reports talking to Dr Valentino Saxon about this at an office visit but feels her pain is worse when the baby moves. Patient reports + fetal movement and denies vaginal bleeding of leaking of fluid. Vital signs stable. Monitors applied and assessing.

## 2020-06-28 NOTE — Telephone Encounter (Signed)
Pt called in and stated that her Feet and legs are swollen the pt has iced them and they are  Tingling, the pt also stated some nausea. The pt is requesting a call back she was told that swollen could be bad for the baby. I told the nurse I will send a message to the nurse at high priority, and the nurse will call you. The pt verbally understood. Please advise the pt is requesting a call back. Please advise

## 2020-06-28 NOTE — Telephone Encounter (Signed)
Please see last message, I just saw FH wont be in till 80

## 2020-06-28 NOTE — Progress Notes (Signed)
Spoke to [employer] MD at 1744 regarding no change in cervical exam. MD gives verbal order for discharge. RN discussed plan of care with patient and she states some hesitancy/anxiety regarding being sent home due to husband's work schedule. RN reports this concern with Valentino Saxon, MD and Logan Bores, MD and they both verbalized understanding. Valentino Saxon, MD and Logan Bores, MD state plan was to discharge patient and follow up at scheduled appointment tomorrow with Valentino Saxon, MD. Patient verbalized understanding and labor precautions were given. RN walked with patient to Emergency Department for discharge.

## 2020-06-29 ENCOUNTER — Encounter: Payer: Self-pay | Admitting: Obstetrics and Gynecology

## 2020-06-29 ENCOUNTER — Ambulatory Visit (INDEPENDENT_AMBULATORY_CARE_PROVIDER_SITE_OTHER): Payer: Medicaid Other | Admitting: Obstetrics and Gynecology

## 2020-06-29 ENCOUNTER — Inpatient Hospital Stay
Admission: AD | Admit: 2020-06-29 | Payer: Medicaid Other | Source: Ambulatory Visit | Admitting: Obstetrics and Gynecology

## 2020-06-29 ENCOUNTER — Observation Stay (HOSPITAL_BASED_OUTPATIENT_CLINIC_OR_DEPARTMENT_OTHER)
Admission: EM | Admit: 2020-06-29 | Discharge: 2020-06-29 | Disposition: A | Discharge status: Home / Self Care | Payer: Medicaid Other | Source: Home / Self Care | Admitting: Obstetrics and Gynecology

## 2020-06-29 VITALS — BP 113/70 | HR 102 | Wt 218.3 lb

## 2020-06-29 DIAGNOSIS — Z3A39 39 weeks gestation of pregnancy: Secondary | ICD-10-CM

## 2020-06-29 DIAGNOSIS — R1084 Generalized abdominal pain: Secondary | ICD-10-CM

## 2020-06-29 DIAGNOSIS — Z3483 Encounter for supervision of other normal pregnancy, third trimester: Secondary | ICD-10-CM

## 2020-06-29 DIAGNOSIS — O09299 Supervision of pregnancy with other poor reproductive or obstetric history, unspecified trimester: Secondary | ICD-10-CM

## 2020-06-29 DIAGNOSIS — O1203 Gestational edema, third trimester: Secondary | ICD-10-CM

## 2020-06-29 DIAGNOSIS — O368330 Maternal care for abnormalities of the fetal heart rate or rhythm, third trimester, not applicable or unspecified: Secondary | ICD-10-CM | POA: Insufficient documentation

## 2020-06-29 DIAGNOSIS — O26893 Other specified pregnancy related conditions, third trimester: Secondary | ICD-10-CM | POA: Diagnosis not present

## 2020-06-29 LAB — POCT URINALYSIS DIPSTICK OB
Bilirubin, UA: NEGATIVE
Blood, UA: NEGATIVE
Glucose, UA: NEGATIVE
Ketones, UA: NEGATIVE
Leukocytes, UA: NEGATIVE
Nitrite, UA: NEGATIVE
POC,PROTEIN,UA: NEGATIVE
Spec Grav, UA: <=1.005 — AB (ref 1.010–1.025)
Urobilinogen, UA: 0.2 E.U./dL
pH, UA: 7.5 (ref 5.0–8.0)

## 2020-06-29 NOTE — Progress Notes (Signed)
ROB: Patient notes that she is very uncomfortable, was seen in triage but complains that "nothing was done". Notes very swollen ankles, abdominal pain. States that she cannot keep going like this.  Offered membrane sweeping. Patient previously mentioned during last triage appointment that her husband often works several hours away, and is worried about labor progressing fast to where he will not be able to get there in time. This can be an indication for social induction, however L&D induction schedule is full until Sunday. RTC in 1 week if undelivered. F/u sooner on L&D if labor ensues.

## 2020-06-29 NOTE — Progress Notes (Signed)
ROB-PT present for routine prenatal care. Pt c/o of a lot of abd pain and contractions.

## 2020-06-29 NOTE — Discharge Summary (Signed)
    L&D OB Triage Note  SUBJECTIVE Hannah Ball is a 29 y.o. G68P3003 female at [redacted]w[redacted]d, EDD Estimated Date of Delivery: 07/05/20 who presented to triage with complaints of abdominal pain-contractions.  Denies bleeding or leakage of fluid.  OB History  Gravida Para Term Preterm AB Living  4 3 3  0 0 3  SAB TAB Ectopic Multiple Live Births  0 0 0 0 3    # Outcome Date GA Lbr Len/2nd Weight Sex Delivery Anes PTL Lv  4 Current           3 Term 04/13/18 [redacted]w[redacted]d 138:05 / 00:05 3790 g F Vag-Spont EPI  LIV     Name: YOUNG-GARCIA,GIRL Jaeleigh     Apgar1: 8  Apgar5: 9  2 Term 05/09/17 [redacted]w[redacted]d 11:49 / 02:13 4880 g M Vag-Vacuum EPI  LIV     Birth Comments: WNL     Name: YOUNG-GARCIA,BOY Marybella     Apgar1: 8  Apgar5: 9  1 Term 2013   3544 g M Vag-Spont EPI  LIV     Complications: Ovarian cyst during pregnancy    No medications prior to admission.     OBJECTIVE  Nursing Evaluation:   BP 113/61 (BP Location: Right Arm)   Pulse 81   Temp 97.8 F (36.6 C) (Oral)   Resp 18   Ht 5\' 9"  (1.753 m)   Wt 93 kg   LMP 09/29/2019   BMI 30.28 kg/m    Findings:        Irregular contractions -patient not laboring.     Reassuring fetal heart rate tracing.          NST was performed and has been reviewed by me.  NST INTERPRETATION: Category I  Mode: External Baseline Rate (A): 125 bpm Variability: Moderate Accelerations: 15 x 15 Decelerations: None     Contraction Frequency (min): 6-8 irregular  ASSESSMENT Impression:  1.  Pregnancy:  at [redacted]w[redacted]d , EDD Estimated Date of Delivery: 07/05/20 2.  Reassuring fetal and maternal status 3.  Not in labor  PLAN 1. Discussed current condition and above findings with patient and reassurance given.  All questions answered. 2. Discharge home with standard labor precautions given to return to L&D or call the office for problems. 3. Continue routine prenatal care.

## 2020-06-29 NOTE — Patient Instructions (Signed)

## 2020-06-29 NOTE — OB Triage Note (Signed)
Presents for complaint of contractions during the night. Denies bloody show or leaking of fluid. EFM applied

## 2020-06-29 NOTE — Discharge Summary (Signed)
   [id number] L&D OB Triage Note  SUBJECTIVE Hannah Ball is a 29 y.o. S0Y3016 female at [redacted]w[redacted]d, EDD Estimated Date of Delivery: 07/05/20 who presented to triage with complaints of upper abdominal pain when the baby moves and recent onset of foot swelling.  Denies bleeding or leakage of fluid.   OB History  Gravida Para Term Preterm AB Living  4 3 3  0 0 3  SAB TAB Ectopic Multiple Live Births  0 0 0 0 3    # Outcome Date GA Lbr Len/2nd Weight Sex Delivery Anes PTL Lv  4 Current           3 Term 04/13/18 [redacted]w[redacted]d 138:05 / 00:05 3790 g F Vag-Spont EPI  LIV     Name: YOUNG-GARCIA,GIRL Oriya     Apgar1: 8  Apgar5: 9  2 Term 05/09/17 [redacted]w[redacted]d 11:49 / 02:13 4880 g M Vag-Vacuum EPI  LIV     Birth Comments: WNL     Name: YOUNG-GARCIA,BOY Katura     Apgar1: 8  Apgar5: 9  1 Term 2013   3544 g M Vag-Spont EPI  LIV     Complications: Ovarian cyst during pregnancy    Medications Prior to Admission  Medication Sig Dispense Refill Last Dose  . acetaminophen (TYLENOL) 325 MG tablet Take 650 mg by mouth every 6 (six) hours as needed.   06/27/2020 at Unknown time     OBJECTIVE  Nursing Evaluation:   BP 101/63 (BP Location: Left Arm)   Pulse (!) 108   Temp 98 F (36.7 C) (Oral)   Resp 16   Ht 5\' 9"  (1.753 m)   Wt 93 kg   LMP 09/29/2019   BMI 30.27 kg/m    Findings:        Patient evaluated for greater than 2 hours with no cervical change.     Contractions irregular 10 to 20 minutes apart.     Normal blood pressures and normal edema of pregnancy.      NST was performed and has been reviewed by me.  NST INTERPRETATION: Category I  Mode: External Baseline Rate (A): 120 bpm (fht) Variability: Moderate Accelerations: 15 x 15 Decelerations: None     Contraction Frequency (min): rare  ASSESSMENT Impression:  1.  Pregnancy:  at [redacted]w[redacted]d , EDD Estimated Date of Delivery: 07/05/20 2.  Reassuring fetal and maternal status 3.  Patient not in labor  PLAN 1. Discussed  current condition and above findings with patient and reassurance given.  All questions answered. 2. Discharge home with standard labor precautions given to return to L&D or call the office for problems. 3. Continue routine prenatal care.

## 2020-06-30 ENCOUNTER — Inpatient Hospital Stay: Payer: Medicaid Other | Admitting: Anesthesiology

## 2020-06-30 ENCOUNTER — Inpatient Hospital Stay
Admission: EM | Admit: 2020-06-30 | Discharge: 2020-07-01 | DRG: 807 | Disposition: A | Discharge status: Home / Self Care | Payer: Medicaid Other | Attending: Obstetrics and Gynecology | Admitting: Obstetrics and Gynecology

## 2020-06-30 ENCOUNTER — Encounter: Payer: Self-pay | Admitting: Obstetrics and Gynecology

## 2020-06-30 ENCOUNTER — Other Ambulatory Visit: Payer: Self-pay

## 2020-06-30 DIAGNOSIS — Z880 Allergy status to penicillin: Secondary | ICD-10-CM | POA: Diagnosis not present

## 2020-06-30 DIAGNOSIS — Z6791 Unspecified blood type, Rh negative: Secondary | ICD-10-CM

## 2020-06-30 DIAGNOSIS — O26899 Other specified pregnancy related conditions, unspecified trimester: Secondary | ICD-10-CM

## 2020-06-30 DIAGNOSIS — D649 Anemia, unspecified: Secondary | ICD-10-CM | POA: Diagnosis present

## 2020-06-30 DIAGNOSIS — O09899 Supervision of other high risk pregnancies, unspecified trimester: Secondary | ICD-10-CM

## 2020-06-30 DIAGNOSIS — Z3A39 39 weeks gestation of pregnancy: Secondary | ICD-10-CM

## 2020-06-30 DIAGNOSIS — O26893 Other specified pregnancy related conditions, third trimester: Secondary | ICD-10-CM | POA: Diagnosis present

## 2020-06-30 DIAGNOSIS — O9902 Anemia complicating childbirth: Secondary | ICD-10-CM | POA: Diagnosis present

## 2020-06-30 DIAGNOSIS — Z2839 Other underimmunization status: Secondary | ICD-10-CM

## 2020-06-30 DIAGNOSIS — O4202 Full-term premature rupture of membranes, onset of labor within 24 hours of rupture: Secondary | ICD-10-CM

## 2020-06-30 DIAGNOSIS — O99013 Anemia complicating pregnancy, third trimester: Secondary | ICD-10-CM

## 2020-06-30 DIAGNOSIS — Z8616 Personal history of COVID-19: Secondary | ICD-10-CM

## 2020-06-30 DIAGNOSIS — O4292 Full-term premature rupture of membranes, unspecified as to length of time between rupture and onset of labor: Secondary | ICD-10-CM | POA: Diagnosis present

## 2020-06-30 DIAGNOSIS — Z20822 Contact with and (suspected) exposure to covid-19: Secondary | ICD-10-CM | POA: Diagnosis present

## 2020-06-30 DIAGNOSIS — O360130 Maternal care for anti-D [Rh] antibodies, third trimester, not applicable or unspecified: Secondary | ICD-10-CM

## 2020-06-30 DIAGNOSIS — O09299 Supervision of pregnancy with other poor reproductive or obstetric history, unspecified trimester: Secondary | ICD-10-CM

## 2020-06-30 LAB — CBC
HCT: 33.5 % — ABNORMAL LOW (ref 36.0–46.0)
Hemoglobin: 9.9 g/dL — ABNORMAL LOW (ref 12.0–15.0)
MCH: 21.6 pg — ABNORMAL LOW (ref 26.0–34.0)
MCHC: 29.6 g/dL — ABNORMAL LOW (ref 30.0–36.0)
MCV: 73 fL — ABNORMAL LOW (ref 80.0–100.0)
Platelets: 159 10*3/uL (ref 150–400)
RBC: 4.59 MIL/uL (ref 3.87–5.11)
RDW: 18.2 % — ABNORMAL HIGH (ref 11.5–15.5)
WBC: 8.1 10*3/uL (ref 4.0–10.5)
nRBC: 0 % (ref 0.0–0.2)

## 2020-06-30 LAB — TYPE AND SCREEN
ABO/RH(D): A NEG
Antibody Screen: POSITIVE

## 2020-06-30 LAB — SARS CORONAVIRUS 2 BY RT PCR (HOSPITAL ORDER, PERFORMED IN ~~LOC~~ HOSPITAL LAB): SARS Coronavirus 2: NEGATIVE

## 2020-06-30 MED ORDER — IBUPROFEN 600 MG PO TABS: 600.0000 mg | ORAL_TABLET | Freq: Four times a day (QID) | ORAL | Status: DC

## 2020-06-30 MED ORDER — AMMONIA AROMATIC IN INHA
RESPIRATORY_TRACT | Status: AC
  Filled 2020-06-30: qty 10

## 2020-06-30 MED ORDER — OXYTOCIN-SODIUM CHLORIDE 30-0.9 UT/500ML-% IV SOLN
1.0000 m[IU]/min | INTRAVENOUS | Status: DC
  Filled 2020-06-30: qty 500

## 2020-06-30 MED ORDER — PRENATAL MULTIVITAMIN CH
1.0000 | ORAL_TABLET | Freq: Every day | ORAL | Status: DC
  Administered 2020-07-01: 1 via ORAL
  Filled 2020-06-30: qty 1

## 2020-06-30 MED ORDER — LACTATED RINGERS IV SOLN: INTRAVENOUS | Status: DC

## 2020-06-30 MED ORDER — SENNOSIDES-DOCUSATE SODIUM 8.6-50 MG PO TABS
2.0000 | ORAL_TABLET | ORAL | Status: DC
  Administered 2020-07-01: 2 via ORAL
  Filled 2020-06-30: qty 2

## 2020-06-30 MED ORDER — SOD CITRATE-CITRIC ACID 500-334 MG/5ML PO SOLN: 30.0000 mL | ORAL | Status: DC | PRN

## 2020-06-30 MED ORDER — COCONUT OIL OIL
1.0000 "application " | TOPICAL_OIL | Status: DC | PRN
  Administered 2020-07-01: 1 via TOPICAL
  Filled 2020-06-30: qty 120

## 2020-06-30 MED ORDER — OXYCODONE-ACETAMINOPHEN 5-325 MG PO TABS: 1.0000 | ORAL_TABLET | ORAL | Status: DC | PRN

## 2020-06-30 MED ORDER — BUTORPHANOL TARTRATE 1 MG/ML IJ SOLN: 1.0000 mg | INTRAMUSCULAR | Status: DC | PRN

## 2020-06-30 MED ORDER — FENTANYL 2.5 MCG/ML W/ROPIVACAINE 0.15% IN NS 100 ML EPIDURAL (ARMC)
EPIDURAL | Status: DC | PRN
Start: 2020-06-30 — End: 2020-06-30
  Administered 2020-06-30: 12 mL/h via EPIDURAL

## 2020-06-30 MED ORDER — BUPIVACAINE HCL (PF) 0.25 % IJ SOLN
INTRAMUSCULAR | Status: DC | PRN
  Administered 2020-06-30 (×6): 4 mL via EPIDURAL

## 2020-06-30 MED ORDER — DIPHENHYDRAMINE HCL 25 MG PO CAPS: 25.0000 mg | ORAL_CAPSULE | Freq: Four times a day (QID) | ORAL | Status: DC | PRN

## 2020-06-30 MED ORDER — ONDANSETRON HCL 4 MG PO TABS: 4.0000 mg | ORAL_TABLET | ORAL | Status: DC | PRN

## 2020-06-30 MED ORDER — ZOLPIDEM TARTRATE 5 MG PO TABS: 5.0000 mg | ORAL_TABLET | Freq: Every evening | ORAL | Status: DC | PRN

## 2020-06-30 MED ORDER — IBUPROFEN 600 MG PO TABS
600.0000 mg | ORAL_TABLET | Freq: Four times a day (QID) | ORAL | Status: DC
  Administered 2020-07-01 (×3): 600 mg via ORAL
  Filled 2020-06-30 (×3): qty 1

## 2020-06-30 MED ORDER — OXYCODONE-ACETAMINOPHEN 5-325 MG PO TABS: 2.0000 | ORAL_TABLET | ORAL | Status: DC | PRN

## 2020-06-30 MED ORDER — OXYTOCIN-SODIUM CHLORIDE 30-0.9 UT/500ML-% IV SOLN: 2.5000 [IU]/h | INTRAVENOUS | Status: DC

## 2020-06-30 MED ORDER — BENZOCAINE-MENTHOL 20-0.5 % EX AERO
1.0000 "application " | INHALATION_SPRAY | CUTANEOUS | Status: DC | PRN
  Filled 2020-06-30: qty 56

## 2020-06-30 MED ORDER — ACETAMINOPHEN 325 MG PO TABS
650.0000 mg | ORAL_TABLET | ORAL | Status: DC | PRN
  Administered 2020-06-30 – 2020-07-01 (×3): 650 mg via ORAL
  Filled 2020-06-30 (×3): qty 2

## 2020-06-30 MED ORDER — FENTANYL 2.5 MCG/ML W/ROPIVACAINE 0.15% IN NS 100 ML EPIDURAL (ARMC)
EPIDURAL | Status: AC
  Filled 2020-06-30: qty 100

## 2020-06-30 MED ORDER — WITCH HAZEL-GLYCERIN EX PADS: 1.0000 "application " | MEDICATED_PAD | CUTANEOUS | Status: DC | PRN

## 2020-06-30 MED ORDER — OXYTOCIN 10 UNIT/ML IJ SOLN
INTRAMUSCULAR | Status: AC
  Filled 2020-06-30: qty 2

## 2020-06-30 MED ORDER — LIDOCAINE-EPINEPHRINE (PF) 1.5 %-1:200000 IJ SOLN
INTRAMUSCULAR | Status: DC | PRN
  Administered 2020-06-30: 3 mL via EPIDURAL
  Administered 2020-06-30: 4 mL via EPIDURAL

## 2020-06-30 MED ORDER — ONDANSETRON HCL 4 MG/2ML IJ SOLN: 4.0000 mg | Freq: Four times a day (QID) | INTRAMUSCULAR | Status: DC | PRN

## 2020-06-30 MED ORDER — MEASLES, MUMPS & RUBELLA VAC IJ SOLR
0.5000 mL | Freq: Once | INTRAMUSCULAR | Status: DC
  Filled 2020-06-30: qty 0.5

## 2020-06-30 MED ORDER — LIDOCAINE HCL (PF) 1 % IJ SOLN
INTRAMUSCULAR | Status: DC | PRN
  Administered 2020-06-30 (×2): 3 mL via SUBCUTANEOUS

## 2020-06-30 MED ORDER — LIDOCAINE HCL (PF) 1 % IJ SOLN: 30.0000 mL | INTRAMUSCULAR | Status: DC | PRN

## 2020-06-30 MED ORDER — ONDANSETRON HCL 4 MG/2ML IJ SOLN: 4.0000 mg | INTRAMUSCULAR | Status: DC | PRN

## 2020-06-30 MED ORDER — OXYTOCIN BOLUS FROM INFUSION
333.0000 mL | Freq: Once | INTRAVENOUS | Status: AC
  Administered 2020-06-30: 333 mL via INTRAVENOUS

## 2020-06-30 MED ORDER — TERBUTALINE SULFATE 1 MG/ML IJ SOLN: 0.2500 mg | Freq: Once | INTRAMUSCULAR | Status: DC | PRN

## 2020-06-30 MED ORDER — ACETAMINOPHEN 325 MG PO TABS: 650.0000 mg | ORAL_TABLET | ORAL | Status: DC | PRN

## 2020-06-30 MED ORDER — MISOPROSTOL 200 MCG PO TABS
ORAL_TABLET | ORAL | Status: AC
  Filled 2020-06-30: qty 4

## 2020-06-30 MED ORDER — DIBUCAINE (PERIANAL) 1 % EX OINT: 1.0000 "application " | TOPICAL_OINTMENT | CUTANEOUS | Status: DC | PRN

## 2020-06-30 MED ORDER — IBUPROFEN 600 MG PO TABS
ORAL_TABLET | ORAL | Status: AC
  Administered 2020-06-30: 600 mg via ORAL
  Filled 2020-06-30: qty 1

## 2020-06-30 MED ORDER — LACTATED RINGERS IV SOLN
500.0000 mL | INTRAVENOUS | Status: DC | PRN
  Administered 2020-06-30: 1000 mL via INTRAVENOUS

## 2020-06-30 MED ORDER — SIMETHICONE 80 MG PO CHEW: 80.0000 mg | CHEWABLE_TABLET | ORAL | Status: DC | PRN

## 2020-06-30 NOTE — Anesthesia Procedure Notes (Signed)
Epidural Patient location during procedure: OB Start time: 06/30/2020 1:42 PM End time: 06/30/2020 1:56 PM  Staffing Anesthesiologist: Naomie Dean, MD Resident/CRNA: Rosanne Gutting, CRNA Performed: resident/CRNA   Preanesthetic Checklist Completed: patient identified, IV checked, site marked, risks and benefits discussed, surgical consent, monitors and equipment checked, pre-op evaluation and timeout performed  Epidural Patient position: sitting Prep: ChloraPrep Patient monitoring: heart rate, continuous pulse ox and blood pressure Approach: midline Location: L4-L5 Injection technique: LOR saline  Needle:  Needle type: Tuohy  Needle gauge: 17 G Needle length: 9 cm and 9 Needle insertion depth: 6 cm Catheter type: closed end flexible Catheter size: 19 Gauge Catheter at skin depth: 11 cm Test dose: negative and 1.5% lidocaine with Epi 1:200 K  Assessment Sensory level: T10 Events: blood not aspirated, injection not painful, no injection resistance, no paresthesia and negative IV test  Additional Notes 1 attempt Pt. Evaluated and documentation done after procedure finished. Patient identified. Risks/Benefits/Options discussed with patient including but not limited to bleeding, infection, nerve damage, paralysis, failed block, incomplete pain control, headache, blood pressure changes, nausea, vomiting, reactions to medication both or allergic, itching and postpartum back pain. Confirmed with bedside nurse the patient's most recent platelet count. Confirmed with patient that they are not currently taking any anticoagulation, have any bleeding history or any family history of bleeding disorders. Patient expressed understanding and wished to proceed. All questions were answered. Sterile technique was used throughout the entire procedure. Please see nursing notes for vital signs. Test dose was given through epidural catheter and negative prior to continuing to dose epidural or  start infusion. Warning signs of high block given to the patient including shortness of breath, tingling/numbness in hands, complete motor block, or any concerning symptoms with instructions to call for help. Patient was given instructions on fall risk and not to get out of bed. All questions and concerns addressed with instructions to call with any issues or inadequate analgesia.   Patient tolerated the insertion well without immediate complications.Reason for block:procedure for pain

## 2020-06-30 NOTE — ED Notes (Signed)
39 weeks, Cherry OBS 4

## 2020-06-30 NOTE — H&P (Addendum)
Obstetric History and Physical  ALSACE DOWD is a 29 y.o. [phone] with IUP at 53w2dpresenting for complaints of worsening more painful contractions.  Was seen in office yesterday and had membrane sweeping. Patient states she has been having  irregular, every 4-5 minutes contractions, scant vaginal bleeding (spotting), intact membranes (however questions possible leaking of fluids after showering this morning), with active fetal movement.    Prenatal Course Source of Care: [facility] with onset of care at  weeks Pregnancy complications or risks: Patient Active Problem List   Diagnosis Date Noted  . Full-term premature rupture of membranes 06/30/2020  . Indication for care in labor and delivery, antepartum 06/28/2020  . Pregnancy 06/24/2020  . Labor and delivery indication for care or intervention 06/21/2020  . Indication for care in labor or delivery 03/11/2020  . COVID-19 12/07/2019  . History of macrosomia in infant in prior pregnancy, currently pregnant 09/26/2017  . Rubella non-immune status, antepartum 08/14/2017  . Rh negative, antepartum 10/16/2016   She plans to breastfeed She desires bilateral tubal ligation for postpartum contraception.   Prenatal labs and studies: ABO, Rh: --/--/A NEG (09/01 1016) Antibody: POS (09/01 1016) Rubella: 2.77 (02/22 1053) RPR: Non Reactive (06/16 0901)  HBsAg: Negative (02/22 1053)  HIV: Non Reactive (02/22 1053)  GBS:--/Negative (08/11 1200) 1 hr Glucola normal Genetic screening normal Anatomy [place]ormal   Past Medical History:  Diagnosis Date  . Migraines   . Nausea/vomiting in pregnancy 10/26/2016   Trial of diclegis   . Ovarian cyst   . Rubella non-immune status, antepartum 08/14/2017   Needs MMR PP  . Type A blood, Rh negative     Past Surgical History:  Procedure Laterality Date  . LAPAROSCOPIC OVARIAN CYSTECTOMY    . OVARIAN CYST REMOVAL  2008    OB History  Gravida Para Term Preterm AB Living  _0 0 0 3  SAB TAB Ectopic Multiple Live Births  0 0 0 0 3    # Outcome Date GA Lbr Len/2nd Weight Sex Delivery Anes PTL Lv  4 Current           3 Term 04/13/18 352w1d38:05 / 00:05 3790 g F Vag-Spont EPI  LIV  2 Term 05/09/17 4036w3d:49 / 02:13 4880 g M Vag-Vacuum EPI  LIV     Birth Comments: WNL  1 Term 2013   3544 g M Vag-Spont EPI  LIV     Complications: Ovarian cyst during pregnancy    Social History   Socioeconomic History  . Marital status: Married    Spouse name: ChrDarrick Meigs. Number of children: Not on file  . Years of education: Not on file  . Highest education level: Not on file  Occupational History  . Not on file  Tobacco Use  . Smoking status: Never Smoker  . Smokeless tobacco: Never Used  Vaping Use  . Vaping Use: Never used  Substance and Sexual Activity  . Alcohol use: No  . Drug use: No  . Sexual activity: Not Currently    Partners: Male    Comment: undecided  Other Topics Concern  . Not on file  Social History Narrative   ** Merged History Encounter **       Social Determinants of Health   Financial Resource Strain:   . Difficulty of Paying Living Expenses: Not on file  Food Insecurity:   . Worried About [employer] fundraiser the Last Year: Not on  file  . [place] in the Last Year: Not on file  Transportation Needs:   . Lack of Transportation (Medical): Not on file  . Lack of Transportation (Non-Medical): Not on file  Physical Activity:   . Days of Exercise per Week: Not on file  . Minutes of Exercise per Session: Not on file  Stress:   . Feeling of Stress : Not on file  Social Connections:   . Frequency of Communication with Friends and Family: Not on file  . Frequency of Social Gatherings with Friends and Family: Not on file  . Attends Religious Services: Not on file  . Active Member of Clubs or Organizations: Not on file  . Attends Archivist Meetings: Not on file  . Marital Status: Not on file    Family History   Problem Relation Age of Onset  . Diabetes Maternal Grandfather   . Kidney disease Mother     Medications Prior to Admission  Medication Sig Dispense Refill Last Dose  . acetaminophen (TYLENOL) 325 MG tablet Take 650 mg by mouth every 6 (six) hours as needed.   06/29/2020 at Unknown time    Allergies  Allergen Reactions  . Benadryl [Diphenhydramine Hcl (Sleep)] Hives  . Latex Hives  . Neosporin Plus Max St Other (See Comments)    Skin burns  . Neosporin Wound Cleanser [Benzalkonium Chloride] Hives  . Penicillins Rash    Has patient had a PCN reaction causing immediate rash, facial/tongue/throat swelling, SOB or lightheadedness with hypotension: Yes Has patient had a PCN reaction causing severe rash involving mucus membranes or skin necrosis: No Has patient had a PCN reaction that required hospitalization No Has patient had a PCN reaction occurring within the last 10 years: No If all of the above answers are "NO", then may proceed with Cephalosporin use.     Review of Systems: Negative except for what is mentioned in HPI.  Physical Exam: BP (!) 103/59   Pulse 90   Temp 97.7 F (36.5 C) (Oral)   Resp 16   Ht _0  (1.753 m)   Wt 99 kg   LMP 09/29/2019   SpO2 100%   BMI 32.24 kg/m  CONSTITUTIONAL: Well-developed, well-nourished female in no acute distress.  HENT:  Normocephalic, atraumatic, External right and left ear normal. Oropharynx is clear and moist EYES: Conjunctivae and EOM are normal. Pupils are equal, round, and reactive to light. No scleral icterus.  NECK: Normal range of motion, supple, no masses SKIN: Skin is warm and dry. No rash noted. Not diaphoretic. No erythema. No pallor. NEUROLOGIC: Alert and oriented to person, place, and time. Normal reflexes, muscle tone coordination. No cranial nerve deficit noted. PSYCHIATRIC: Normal mood and affect. Normal behavior. Normal judgment and thought content. CARDIOVASCULAR: Normal heart rate noted, regular  rhythm RESPIRATORY: Effort and breath sounds normal, no problems with respiration noted ABDOMEN: Soft, nontender, nondistended, gravid. MUSCULOSKELETAL: Normal range of motion. No edema and no tenderness. 2+ distal pulses.  Cervical Exam: Dilatation 5.5 cm   Effacement 80%   Station -2. Clear fluid noted on perineum. Presentation: cephalic FHT:  Baseline rate 115 bpm   Variability moderate  Accelerations present   Decelerations none Contractions: Every 2-5 mins   Pertinent Labs/Studies:   Results for orders placed or performed during the hospital encounter of 06/30/20 (from the past 24 hour(s))  SARS Coronavirus 2 by RT PCR (hospital order, performed in [facility] hospital lab) Nasopharyngeal Nasopharyngeal Swab     Status:  None   Collection Time: 06/30/20  9:26 AM   Specimen: Nasopharyngeal Swab  Result Value Ref Range   SARS Coronavirus 2 NEGATIVE NEGATIVE  CBC     Status: Abnormal   Collection Time: 06/30/20 10:16 AM  Result Value Ref Range   WBC 8.1 4.0 - 10.5 K/uL   RBC 4.59 3.87 - 5.11 MIL/uL   Hemoglobin 9.9 (L) 12.0 - 15.0 g/dL   HCT 33.5 (L) 36 - 46 %   MCV 73.0 (L) 80.0 - 100.0 fL   MCH 21.6 (L) 26.0 - 34.0 pg   MCHC 29.6 (L) 30.0 - 36.0 g/dL   RDW 18.2 (H) 11.5 - 15.5 %   Platelets 159 150 - 400 K/uL   nRBC 0.0 0.0 - 0.2 %  Type and screen [facility]     Status: None   Collection Time: 06/30/20 10:16 AM  Result Value Ref Range   ABO/RH(D) A NEG    Antibody Screen POS    Sample Expiration      07/03/2020,2359 Performed at [facility], [address], [place], [place] [address]     Assessment : ARMANI GAWLIK is a 29 y.o. [phone] at 69w2dbeing admitted for labor with SROM. Rh negative. History of fetal macrosomia.   Plan: Labor: Expectant management. Augmentation as needed per protocol with Pitocin. Desires epidural. FWB: Reassuring fetal heart tracing.  GBS negative.  Delivery plan: Hopeful for vaginal delivery.  For  Rhogam postpartum.    CRubie Maid MD [facility]

## 2020-06-30 NOTE — Anesthesia Procedure Notes (Signed)
Epidural  Start time: 06/30/2020 11:38 AM End time: 06/30/2020 11:55 AM  Staffing Anesthesiologist: Naomie Dean, MD Resident/CRNA: Irving Burton, CRNA Performed: resident/CRNA   Preanesthetic Checklist Completed: patient identified, IV checked, site marked, risks and benefits discussed, surgical consent, monitors and equipment checked and pre-op evaluation  Epidural Patient position: sitting Prep: ChloraPrep and site prepped and draped Patient monitoring: heart rate, continuous pulse ox and blood pressure Approach: midline Location: L3-L4 Injection technique: LOR air  Needle:  Needle type: Tuohy  Needle gauge: 17 G Needle length: 9 cm Needle insertion depth: 6 cm Catheter type: closed end flexible Catheter size: 19 Gauge Catheter at skin depth: 12 cm Test dose: negative and 1.5% lidocaine with Epi 1:200 K  Assessment Events: blood not aspirated, injection not painful, no injection resistance, no paresthesia and negative IV test  Additional Notes Reason for block:procedure for pain

## 2020-06-30 NOTE — Progress Notes (Signed)
Intrapartum Progress Note  S: Patient doing well, no complaints. Redosed epidural is working.   O: Blood pressure (!) 101/52, pulse 80, temperature (!) 97.5 F (36.4 C), temperature source Oral, resp. rate 16, height 5\' 9"  (1.753 m), weight 99 kg, last menstrual period 09/29/2019, SpO2 100 %. Gen App: NAD, comfortable Abdomen: soft, gravid FHT: baseline 120 bpm.  Accels present.  Decels absent. moderate in degree variability.   Tocometer: contractions q 3-4 minutes Cervix: 9/80-90/0 Extremities: Nontender, no edema.  Pitocin: None  Labs: No new labs   Assessment:  1: SIUP at [redacted]w[redacted]d 2. RH negative 3. H/o macrosomia  Plan:  1. Continue expectant management at this time. Progressing well.  2. Rhogam postptarum 3. Current fetus does not feel macrosomic.    [redacted]w[redacted]d, MD 06/30/2020 5:38 PM

## 2020-06-30 NOTE — Progress Notes (Signed)
RN turned off epidural infusion at 1905. Unable to stop in [facility].

## 2020-06-30 NOTE — Anesthesia Preprocedure Evaluation (Signed)
Anesthesia Evaluation  Patient identified by MRN, date of birth, ID band Patient awake    Reviewed: Allergy & Precautions, NPO status , Patient's Chart, lab work & pertinent test results  History of Anesthesia Complications Negative for: history of anesthetic complications  Airway Mallampati: II  TM Distance: >3 FB Neck ROM: Full    Dental  (+) Dental Advisory Given   Pulmonary neg pulmonary ROS,    breath sounds clear to auscultation       Cardiovascular negative cardio ROS   Rhythm:Regular Rate:Normal     Neuro/Psych  Headaches,    GI/Hepatic negative GI ROS, Neg liver ROS,   Endo/Other  negative endocrine ROS  Renal/GU negative Renal ROS     Musculoskeletal   Abdominal (+) + obese,   Peds  Hematology plt 181k   Anesthesia Other Findings   Reproductive/Obstetrics (+) Pregnancy                             Anesthesia Physical  Anesthesia Plan  ASA: II  Anesthesia Plan: Epidural   Post-op Pain Management:    Induction:   PONV Risk Score and Plan: 2 and Treatment may vary due to age or medical condition  Airway Management Planned: Natural Airway  Additional Equipment:   Intra-op Plan:   Post-operative Plan:   Informed Consent: I have reviewed the patients History and Physical, chart, labs and discussed the procedure including the risks, benefits and alternatives for the proposed anesthesia with the patient or authorized representative who has indicated his/her understanding and acceptance.     Dental advisory given  Plan Discussed with: Anesthesiologist  Anesthesia Plan Comments: (Patient identified. Risks/Benefits/Options discussed with patient including but not limited to bleeding, infection, nerve damage, paralysis, failed block, incomplete pain control, headache, blood pressure changes, nausea, vomiting, reactions to medication both or allergic, itching and  postpartum back pain. Confirmed with bedside nurse the patient's most recent platelet count. Confirmed with patient that they are not currently taking any anticoagulation, have any bleeding history or any family history of bleeding disorders. Patient expressed understanding and wished to proceed. All questions were answered. )        Anesthesia Quick Evaluation

## 2020-07-01 LAB — CBC
HCT: 29.4 % — ABNORMAL LOW (ref 36.0–46.0)
Hemoglobin: 8.7 g/dL — ABNORMAL LOW (ref 12.0–15.0)
MCH: 21.5 pg — ABNORMAL LOW (ref 26.0–34.0)
MCHC: 29.6 g/dL — ABNORMAL LOW (ref 30.0–36.0)
MCV: 72.8 fL — ABNORMAL LOW (ref 80.0–100.0)
Platelets: 139 10*3/uL — ABNORMAL LOW (ref 150–400)
RBC: 4.04 MIL/uL (ref 3.87–5.11)
RDW: 17.8 % — ABNORMAL HIGH (ref 11.5–15.5)
WBC: 8 10*3/uL (ref 4.0–10.5)
nRBC: 0 % (ref 0.0–0.2)

## 2020-07-01 LAB — RPR: RPR Ser Ql: NONREACTIVE — AB

## 2020-07-01 MED ORDER — IBUPROFEN 600 MG PO TABS: 600.0000 mg | ORAL_TABLET | Freq: Four times a day (QID) | ORAL | 0 refills | Status: DC

## 2020-07-01 NOTE — Progress Notes (Addendum)
Post Partum Day # 1, s/p SVD  Subjective: up ad lib, voiding and tolerating PO. She does report some abdominal cramping. Breastfeeding going well.   Objective: Temp:  [97.5 F (36.4 C)-98.6 F (37 C)] 97.6 F (36.4 C) (09/02 0738) Pulse Rate:  [71-109] 82 (09/02 0738) Resp:  [16-20] 20 (09/02 0738) BP: (77-111)/(37-86) 102/61 (09/02 0738) SpO2:  [95 %-100 %] 100 % (09/02 0738)  Physical Exam:  General: alert and no distress  Lungs: clear to auscultation bilaterally Breasts: normal appearance, no masses or tenderness Heart: regular rate and rhythm, S1, S2 normal, no murmur, click, rub or gallop Abdomen: soft, non-tender; bowel sounds normal; no masses,  no organomegaly Pelvis: Lochia: appropriate, Uterine Fundus: firm Extremities: DVT Evaluation: No evidence of DVT seen on physical exam. Negative Homan's sign. No cords or calf tenderness. No significant calf/ankle edema.  Recent Labs    06/30/20 1016 07/01/20 0545  HGB 9.9* 8.7*  HCT 33.5* 29.4*    Assessment/Plan: Doing well postpartum Breastfeeding, Lactation consult as needed.  Contraception: plans for interval BTL.  Anemia of pregnancy, asymptomatic. For PO iron supplementation. Rh negative, for rhogam postpartum Rubella non-immune, for vaccination postpartum.   Dispo: possible d/c home later this evening, or in a.m.    LOS: 1 day   Hildred Laser, MD [facility] 07/01/2020 9:39 AM

## 2020-07-01 NOTE — Lactation Note (Signed)
This note was copied from a baby's chart. Lactation Consultation Note  Patient Name: Hannah Ball [id number] Date: 07/01/2020 Reason for consult: Initial assessment;Term;Other (Comment) (spitting up)  Lactation introduction. Mom is G4P4, SVD 15hrs ago. Mom has limited breastfeeding experience with max length of 4 weeks with second child. Mom cited nipple pain/cracked&bleeding, and low supply as main reasons for discontinuation.  Baby has had an adequate number of feedings since delivery, but mom states she may discontinue BF because baby is spitting up large volumes after breastfeeding and she feels baby is to sensitive to her breastmilk. LC talked with mom about possible normal newborn occurrence of being spitty/gaggy post delivery with intake of amniotic fluid during delivery. Encouraged breastfeeding efforts today with LC support before opting for formula. Mom agreeable with support and delaying formula introduction.  Education given for normal newborn behaviors, newborn stomach size, feeding patterns, early cues, benefits of skin to skin, and output expectations. Lactation name/number updated on whiteboard.  Maternal Data Formula Feeding for Exclusion: No Has patient been taught Hand Expression?: Yes Does the patient have breastfeeding experience prior to this delivery?: Yes  Feeding    LATCH Score                   Interventions Interventions: Breast feeding basics reviewed;Hand express  Lactation Tools Discussed/Used     Consult Status Consult Status: Follow-up Date: 07/01/20 Follow-up type: Call as needed    Danford Bad 07/01/2020, 10:02 AM

## 2020-07-01 NOTE — Discharge Summary (Signed)
RN reviewed discharge instructions with patient and her husband. Gave opportunity for questions. All questions answered at this time. Pt verbalized understanding. Pt discharged home with her husband and baby.

## 2020-07-01 NOTE — Anesthesia Postprocedure Evaluation (Signed)
Anesthesia Post Note  Patient: Hannah Ball  Procedure(s) Performed: AN AD HOC LABOR EPIDURAL  Patient location during evaluation: Mother Baby Anesthesia Type: Epidural Level of consciousness: oriented and awake and alert Pain management: pain level controlled Vital Signs Assessment: post-procedure vital signs reviewed and stable Respiratory status: spontaneous breathing and respiratory function stable Cardiovascular status: blood pressure returned to baseline and stable Postop Assessment: no headache, no backache, no apparent nausea or vomiting and able to ambulate Anesthetic complications: no   No complications documented.   Last Vitals:  Vitals:   07/01/20 0420 07/01/20 0738  BP: 106/63 102/61  Pulse: 71 82  Resp: 16 20  Temp: 36.7 C 36.4 C  SpO2: 100% 100%    Last Pain:  Vitals:   07/01/20 0738  TempSrc: Oral  PainSc:                  Starling Manns

## 2020-07-01 NOTE — Discharge Summary (Signed)
Postpartum Discharge Summary     Patient Name: Hannah Ball DOB: [birth year] MRN: [id number]  Date of admission: 06/30/2020 Delivery date:06/30/2020  Delivering provider: Rubie Maid  Date of discharge: 07/01/2020  Admitting diagnosis: Full-term premature rupture of membranes [O42.92] Intrauterine pregnancy: [redacted]w[redacted]d    Secondary diagnosis:  Active Problems:   Rh negative, antepartum   Rubella non-immune status, antepartum   History of macrosomia in infant in prior pregnancy, currently pregnant   Labor and delivery indication for care or intervention   Full-term premature rupture of membranes  Additional problems: Anemia of pregnancy    Discharge diagnosis: Term Pregnancy Delivered and Anemia                                              Post partum procedures:rhogam Augmentation: AROM Complications: None  Hospital course: Onset of Labor With Vaginal Delivery      29y.o. yo [id number] 374w2das admitted in Active Labor on 06/30/2020. Patient had an uncomplicated labor course as follows:  Membrane Rupture Time/Date: 8:29 AM ,06/30/2020   Delivery Method:Vaginal, Spontaneous  Episiotomy: None  Lacerations:  None  Patient had an uncomplicated postpartum course.  She is ambulating, tolerating a regular diet, passing flatus, and urinating well. Patient is discharged home in stable condition on 07/07/20.  Newborn Data: Birth date:06/30/2020  Birth time:6:55 PM  Gender:Female  Living status:Living  Apgars:8 ,9  Weight:3970 g   Magnesium Sulfate received: No BMZ received: No Rhophylac:Yes MMR:Yes T-DaP:Given prenatally Flu: No Transfusion:No  Physical exam  Vitals:   06/30/20 2349 07/01/20 0420 07/01/20 0738 07/01/20 1535  BP: (!) 93/58 106/63 102/61 (!) 95/51  Pulse: 72 71 82 67  Resp: 20 16 20 20   Temp: 98.2 F (36.8 C) 98 F (36.7 C) 97.6 F (36.4 C) 98.2 F (36.8 C)  TempSrc: Oral Oral Oral Oral  SpO2: 100% 100% 100% 100%  Weight:      Height:        General: alert, cooperative and no distress Lochia: appropriate Uterine Fundus: firm Incision: N/A DVT Evaluation: No evidence of DVT seen on physical exam. Negative Homan's sign. No cords or calf tenderness. No significant calf/ankle edema. Labs: Lab Results  Component Value Date   WBC 8.0 07/01/2020   HGB 8.7 (L) 07/01/2020   HCT 29.4 (L) 07/01/2020   MCV 72.8 (L) 07/01/2020   PLT 139 (L) 07/01/2020    Edinburgh Score: Edinburgh Postnatal Depression Scale Screening Tool 07/01/2020  I have been able to laugh and see the funny side of things. 0  I have looked forward with enjoyment to things. 0  I have blamed myself unnecessarily when things went wrong. 0  I have been anxious or worried for no good reason. 0  I have felt scared or panicky for no good reason. 0  Things have been getting on top of me. 0  I have been so unhappy that I have had difficulty sleeping. 0  I have felt sad or miserable. 0  I have been so unhappy that I have been crying. 0  The thought of harming myself has occurred to me. 0  Edinburgh Postnatal Depression Scale Total 0      After visit meds:  Allergies as of 07/01/2020      Reactions   Benadryl [diphenhydramine Hcl (sleep)] Hives   Latex Hives  Neosporin Plus Max St Other (See Comments)   Skin burns   Neosporin Wound Cleanser [benzalkonium Chloride] Hives   Penicillins Rash   Has patient had a PCN reaction causing immediate rash, facial/tongue/throat swelling, SOB or lightheadedness with hypotension: Yes Has patient had a PCN reaction causing severe rash involving mucus membranes or skin necrosis: No Has patient had a PCN reaction that required hospitalization No Has patient had a PCN reaction occurring within the last 10 years: No If all of the above answers are "NO", then may proceed with Cephalosporin use.      Medication List    STOP taking these medications   acetaminophen 325 MG tablet Commonly known as: TYLENOL     TAKE these  medications   ibuprofen 600 MG tablet Commonly known as: ADVIL Take 1 tablet (600 mg total) by mouth every 6 (six) hours.        Discharge home in stable condition Infant Feeding: Breast Infant Disposition:home with mother Discharge instruction: per After Visit Summary and Postpartum booklet. Activity: Advance as tolerated. Pelvic rest for 6 weeks.  Diet: routine diet Anticipated Birth Control: Plans Interval BTL Postpartum Appointment:4 weeks Additional Postpartum F/U: None Future Appointments:No future appointments. Follow up Visit:  Follow-up Information    Rubie Maid, MD. Schedule an appointment as soon as possible for a visit in 6 week(s).   Specialties: Obstetrics and Gynecology, Radiology Why: Please call to schedule a 4 week pre-op visit and a 6 week postpartum follow up appointment with Dr. Anell Barr information: [address] [place] [address] [phone]                   07/01/2020 Rubie Maid, MD  [facility]

## 2020-07-01 NOTE — Discharge Instructions (Signed)

## 2020-07-08 ENCOUNTER — Telehealth: Payer: Self-pay | Admitting: Obstetrics and Gynecology

## 2020-07-08 MED ORDER — PREPARATION H 0.25-50 % EX GEL: 1.0000 | Freq: Two times a day (BID) | CUTANEOUS | 0 refills | Status: DC

## 2020-07-08 NOTE — Telephone Encounter (Signed)
Patient is calling in stating that she called in today requesting something be called in for hemorrhoids. Informed patient that they nurse did have 24-48 hours to complete tasks once the message was sent back to the nurse, informed her that [place] had sent a message earlier today marked as urgent. Informed patient I could leave another message on her behalf- however allow 24-48 hours so that the nurse can complete the task. Patient was not very understanding and demanded something be sent in today. Could you please advise?

## 2020-07-08 NOTE — Addendum Note (Signed)
Addended by: Silvano Bilis on: 07/08/2020 04:31 PM   Modules accepted: Orders

## 2020-07-08 NOTE — Telephone Encounter (Signed)
Pt called in and stated that she has hemorrhoids inside and outside and the one inside is causing her her to not sleep. The pt is requesting some meds sent to [facility]. The pt stated that she is a lot of pain I told her that I will send a message to the nurse. The pt verbally understood. Please advise

## 2020-07-09 NOTE — Telephone Encounter (Signed)
Pt called no answer LM via VM that medication had been sent in to help with her hemorrhoids.

## 2020-07-19 ENCOUNTER — Telehealth: Payer: Self-pay | Admitting: Obstetrics and Gynecology

## 2020-07-19 NOTE — Telephone Encounter (Signed)
Called pt [facility] made appt for 6 week ppv.

## 2020-08-03 ENCOUNTER — Encounter: Payer: Self-pay | Admitting: Obstetrics and Gynecology

## 2020-08-03 ENCOUNTER — Other Ambulatory Visit: Payer: Self-pay

## 2020-08-03 ENCOUNTER — Ambulatory Visit (INDEPENDENT_AMBULATORY_CARE_PROVIDER_SITE_OTHER): Payer: Medicaid Other | Admitting: Obstetrics and Gynecology

## 2020-08-03 DIAGNOSIS — Z3009 Encounter for other general counseling and advice on contraception: Secondary | ICD-10-CM

## 2020-08-03 DIAGNOSIS — O9081 Anemia of the puerperium: Secondary | ICD-10-CM

## 2020-08-03 NOTE — Progress Notes (Signed)
   OBSTETRICS POSTPARTUM CLINIC PROGRESS NOTE  Subjective:     Hannah Ball is a 29 y.o. G9P4004 female who presents for a postpartum visit. She is 6 weeks postpartum following a spontaneous vaginal delivery. I have fully reviewed the prenatal and intrapartum course. The delivery was at 39 gestational weeks.  Anesthesia: epidural. Postpartum course has been well. Baby's course has been well. Baby is feeding by formula. Bleeding: patient has not resumed menses. Bowel function is normal. Bladder function is normal. Patient is sexually active (has had intercourse ~ 4 times, twice with protection, and twice without.  Last intercourse was 2 days ago). Contraception method desired is undecided.  Initially desired BTL, however is unsure, may now consider IUD). Postpartum depression screening: negative.  The following portions of the patient's history were reviewed and updated as appropriate: allergies, current medications, past family history, past medical history, past social history, past surgical history and problem list.  Review of Systems Pertinent items noted in HPI and remainder of comprehensive ROS otherwise negative.   Objective:    BP 91/61   Pulse 77   Ht 5\' 9"  (1.753 m)   Wt 196 lb (88.9 kg)   LMP 09/29/2019   Breastfeeding No   BMI 28.94 kg/m   General:  alert and no distress   Breasts:  inspection negative, no nipple discharge or bleeding, no masses or nodularity palpable  Lungs: clear to auscultation bilaterally  Heart:  regular rate and rhythm, S1, S2 normal, no murmur, click, rub or gallop  Abdomen: soft, non-tender; bowel sounds normal; no masses,  no organomegaly.     Vulva:  normal  Vagina: normal vagina, no discharge, exudate, lesion, or erythema  Cervix:  no cervical motion tenderness and no lesions  Corpus: normal size, contour, position, consistency, mobility, non-tender  Adnexa:  normal adnexa and no mass, fullness, tenderness  Rectal Exam: Not performed.           Labs:  Lab Results  Component Value Date   HGB 8.7 (L) 07/01/2020     Assessment:   1. Postpartum care following vaginal delivery 2. Postpartum anemia 3. Contraception counseling  Plan:    1. Contraception: patient undecided between BTL and IUD.  Discussed that if she is uncertain, it is best to proceed with reversible contraceptive option at this time. Discussed possibility of pregnancy at IUD insertion if done today. Can plan for insertion of IUD in 2 weeks, encouraged to maintain abstinence or only have protected intercourse during this time.  2. Will check Hgb for h/o anemia.  3. Follow up in: 2 weeks for IUD insertion (Mirena).    08/31/2020, MD [facility]

## 2020-08-03 NOTE — Progress Notes (Signed)
  PT is present today for her postpartum visit. Pt stated that she is not breastfeeding and have had sexually intercourse recently. Pt stated that she is unsure about birth control. EPDS= 0.  Pt stated that she is doing well no complaints.

## 2020-08-11 ENCOUNTER — Encounter: Payer: Medicaid Other | Admitting: Obstetrics and Gynecology

## 2020-08-18 ENCOUNTER — Encounter: Payer: Self-pay | Admitting: Obstetrics and Gynecology

## 2020-08-18 ENCOUNTER — Ambulatory Visit (INDEPENDENT_AMBULATORY_CARE_PROVIDER_SITE_OTHER): Payer: Medicaid Other | Admitting: Obstetrics and Gynecology

## 2020-08-18 ENCOUNTER — Other Ambulatory Visit: Payer: Self-pay

## 2020-08-18 VITALS — BP 101/67 | HR 64 | Ht 69.0 in | Wt 197.8 lb

## 2020-08-18 DIAGNOSIS — B9689 Other specified bacterial agents as the cause of diseases classified elsewhere: Secondary | ICD-10-CM | POA: Diagnosis not present

## 2020-08-18 DIAGNOSIS — N760 Acute vaginitis: Secondary | ICD-10-CM | POA: Diagnosis not present

## 2020-08-18 DIAGNOSIS — N898 Other specified noninflammatory disorders of vagina: Secondary | ICD-10-CM

## 2020-08-18 MED ORDER — METRONIDAZOLE 500 MG PO TABS: 500.0000 mg | ORAL_TABLET | Freq: Two times a day (BID) | ORAL | 0 refills | Status: AC

## 2020-08-18 NOTE — Progress Notes (Signed)
HPI:      [name] is a 29 y.o. [id number] who LMP was Patient's last menstrual period was 09/29/2019.  Subjective:   She presents today with complaint of 3-day history of vaginal discharge with odor.  She denies itching or burning.  She is approximately 7 weeks postpartum.  She has resumed intercourse without problem.  Occasional discomfort but "not bad". She reports that she has had "yeast and bacteria" before. She is bottlefeeding at this time. She desires an IUD for birth control.    Hx: The following portions of the patient's history were reviewed and updated as appropriate:             She  has a past medical history of Migraines, Nausea/vomiting in pregnancy (10/26/2016), Ovarian cyst, Rubella non-immune status, antepartum (08/14/2017), and Type A blood, Rh negative. She does not have any pertinent problems on file. She  has a past surgical history that includes Laparoscopic ovarian cystectomy and Ovarian cyst removal (2008). Her family history includes Diabetes in her maternal grandfather; Kidney disease in her mother. She  reports that she has never smoked. She has never used smokeless tobacco. She reports that she does not drink alcohol and does not use drugs. She has a current medication list which includes the following prescription(s): ibuprofen, metronidazole, and preparation h. She is allergic to benadryl [diphenhydramine hcl (sleep)], latex, neosporin plus max st, neosporin wound cleanser [benzalkonium chloride], and penicillins.       Review of Systems:  Review of Systems  Constitutional: Denied constitutional symptoms, night sweats, recent illness, fatigue, fever, insomnia and weight loss.  Eyes: Denied eye symptoms, eye pain, photophobia, vision change and visual disturbance.  Ears/Nose/Throat/Neck: Denied ear, nose, throat or neck symptoms, hearing loss, nasal discharge, sinus congestion and sore throat.  Cardiovascular: Denied cardiovascular symptoms,  arrhythmia, chest pain/pressure, edema, exercise intolerance, orthopnea and palpitations.  Respiratory: Denied pulmonary symptoms, asthma, pleuritic pain, productive sputum, cough, dyspnea and wheezing.  Gastrointestinal: Denied, gastro-esophageal reflux, melena, nausea and vomiting.  Genitourinary: See HPI for additional information.  Musculoskeletal: Denied musculoskeletal symptoms, stiffness, swelling, muscle weakness and myalgia.  Dermatologic: Denied dermatology symptoms, rash and scar.  Neurologic: Denied neurology symptoms, dizziness, headache, neck pain and syncope.  Psychiatric: Denied psychiatric symptoms, anxiety and depression.  Endocrine: Denied endocrine symptoms including hot flashes and night sweats.   Meds:   Current Outpatient Medications on File Prior to Visit  Medication Sig Dispense Refill  . ibuprofen (ADVIL) 600 MG tablet Take 1 tablet (600 mg total) by mouth every 6 (six) hours. 30 tablet 0  . Phenylephrine-Witch Hazel (PREPARATION H) 0.25-50 % GEL Apply 1 Tube topically in the morning and at bedtime. (Patient not taking: Reported on 08/03/2020) 5.7 g 0   No current facility-administered medications on file prior to visit.          Objective:     Vitals:   08/18/20 1525  BP: 101/67  Pulse: 64   Filed Weights   08/18/20 1525  Weight: 197 lb 12.8 oz (89.7 kg)              Physical examination   Pelvic:   Vulva: Normal appearance.  No lesions.  Vagina: No lesions or abnormalities noted.  Support: Normal pelvic support.  Urethra No masses tenderness or scarring.  Meatus Normal size without lesions or prolapse.  Cervix: Normal appearance.  No lesions.  Anus: Normal exam.  No lesions.  Perineum: Normal exam.  No lesions.   WET PREP: clue  cells: present, KOH (yeast): negative, odor: present, trichomoniasis: negative and WBC: 4+ Ph:  > 4.5   Assessment:    [id number] Patient Active Problem List   Diagnosis Date Noted  . COVID-19 12/07/2019      1. Bacterial vulvovaginitis   2. Vaginal discharge        Plan:            1.  We will treat with Flagyl.  2.  Patient to return for IUD as scheduled. Orders No orders of the defined types were placed in this encounter.    Meds ordered this encounter  Medications  . metroNIDAZOLE (FLAGYL) 500 MG tablet    Sig: Take 1 tablet (500 mg total) by mouth 2 (two) times daily for 7 days.    Dispense:  14 tablet    Refill:  0      F/U  No follow-ups on file. I spent 22 minutes involved in the care of this patient preparing to see the patient by obtaining and reviewing her medical history (including labs, imaging tests and prior procedures), documenting clinical information in the electronic health record (EHR), counseling and coordinating care plans, writing and sending prescriptions, ordering tests or procedures and directly communicating with the patient by discussing pertinent items from her history and physical exam as well as detailing my assessment and plan as noted above so that she has an informed understanding.  All of her questions were answered.  Hannah Ball, M.D. 08/18/2020 3:46 PM

## 2020-08-27 ENCOUNTER — Encounter: Payer: Medicaid Other | Admitting: Obstetrics and Gynecology

## 2020-08-27 ENCOUNTER — Other Ambulatory Visit: Payer: Self-pay

## 2020-08-27 NOTE — Progress Notes (Deleted)
Pt present for iud insertion.

## 2020-08-30 NOTE — Patient Instructions (Addendum)
Intrauterine Device Insertion, Care After  This sheet gives you information about how to care for yourself after your procedure. Your health care provider may also give you more specific instructions. If you have problems or questions, contact your health care provider. What can I expect after the procedure? After the procedure, it is common to have:  Cramps and pain in the abdomen.  Light bleeding (spotting) or heavier bleeding that is like your menstrual period. This may last for up to a few days.  Lower back pain.  Dizziness.  Headaches.  Nausea. Follow these instructions at home:  Before resuming sexual activity, check to make sure that you can feel the IUD string(s). You should be able to feel the end of the string(s) below the opening of your cervix. If your IUD string is in place, you may resume sexual activity. ? If you had a hormonal IUD inserted more than 7 days after your most recent period started, you will need to use a backup method of birth control for 7 days after IUD insertion. Ask your health care provider whether this applies to you.  Continue to check that the IUD is still in place by feeling for the string(s) after every menstrual period, or once a month.  Take over-the-counter and prescription medicines only as told by your health care provider.  Do not drive or use heavy machinery while taking prescription pain medicine.  Keep all follow-up visits as told by your health care provider. This is important. Contact a health care provider if:  You have bleeding that is heavier or lasts longer than a normal menstrual cycle.  You have a fever.  You have cramps or abdominal pain that get worse or do not get better with medicine.  You develop abdominal pain that is new or is not in the same area of earlier cramping and pain.  You feel lightheaded or weak.  You have abnormal or bad-smelling discharge from your vagina.  You have pain during sexual  activity.  You have any of the following problems with your IUD string(s): ? The string bothers or hurts you or your sexual partner. ? You cannot feel the string. ? The string has gotten longer.  You can feel the IUD in your vagina.  You think you may be pregnant, or you miss your menstrual period.  You think you may have an STI (sexually transmitted infection). Get help right away if:  You have flu-like symptoms.  You have a fever and chills.  You can feel that your IUD has slipped out of place. Summary  After the procedure, it is common to have cramps and pain in the abdomen. It is also common to have light bleeding (spotting) or heavier bleeding that is like your menstrual period.  Continue to check that the IUD is still in place by feeling for the string(s) after every menstrual period, or once a month.  Keep all follow-up visits as told by your health care provider. This is important.  Contact your health care provider if you have problems with your IUD string(s), such as the string getting longer or bothering you or your sexual partner. This information is not intended to replace advice given to you by your health care provider. Make sure you discuss any questions you have with your health care provider. Document Revised: 09/28/2017 Document Reviewed: 09/06/2016 Elsevier Patient Education  2020 Elsevier Inc.  

## 2020-08-30 NOTE — Progress Notes (Signed)
Pt present for IUD Mirena insertion.  Information concerning Mirena IUD given to pt along with consent form signed. UPT not completed due to pt is unable to void and LMP 08/26/2020 pt still on cycle.

## 2020-08-31 ENCOUNTER — Encounter: Payer: Self-pay | Admitting: Obstetrics and Gynecology

## 2020-08-31 ENCOUNTER — Ambulatory Visit (INDEPENDENT_AMBULATORY_CARE_PROVIDER_SITE_OTHER): Payer: Medicaid Other | Admitting: Obstetrics and Gynecology

## 2020-08-31 ENCOUNTER — Other Ambulatory Visit: Payer: Self-pay

## 2020-08-31 VITALS — BP 99/65 | HR 85 | Ht 69.0 in | Wt 198.7 lb

## 2020-08-31 DIAGNOSIS — Z30430 Encounter for insertion of intrauterine contraceptive device: Secondary | ICD-10-CM | POA: Diagnosis not present

## 2020-08-31 DIAGNOSIS — Z975 Presence of (intrauterine) contraceptive device: Secondary | ICD-10-CM | POA: Insufficient documentation

## 2020-08-31 NOTE — Progress Notes (Signed)
    GYNECOLOGY OFFICE PROCEDURE NOTE  Hannah Ball is a 29 y.o. [phone] here for Mirena IUD insertion. No GYN concerns.  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  IUD Insertion Procedure Note Patient identified, informed consent performed, consent signed.   Discussed risks of irregular bleeding, cramping, infection, malpositioning or misplacement of the IUD outside the uterus which may require further procedure such as laparoscopy. Also discussed >99% contraception efficacy, increased risk of ectopic pregnancy with failure of method.   Emphasized that this did not protect against STIs, condoms recommended during all sexual encounters. Time out was performed.  Urine pregnancy test negative.  Speculum placed in the vagina.  Cervix visualized.  Cleaned with Betadine x 2.  Grasped anteriorly with a single tooth tenaculum.  Uterus sounded to 9.5 cm.  Mirena IUD placed per manufacturer's recommendations.  Strings trimmed to 3 cm. Tenaculum was removed, good hemostasis noted.  Patient tolerated procedure well.   Patient was given post-procedure instructions.  She was advised to have backup contraception for one week.  Patient was also asked to check IUD strings periodically and follow up in 4 weeks for IUD check.   Lot: [id number] Exp: 10/2022   Hildred Laser, MD [facility]

## 2020-09-28 NOTE — Progress Notes (Signed)
Pt present for IUD check up. Pt stated that she thinks her IUD has moved due to noticing pain after sex for about 1-2 days in the lower abd area.

## 2020-09-29 ENCOUNTER — Other Ambulatory Visit: Payer: Self-pay

## 2020-09-29 ENCOUNTER — Ambulatory Visit (INDEPENDENT_AMBULATORY_CARE_PROVIDER_SITE_OTHER): Payer: Medicaid Other | Admitting: Obstetrics and Gynecology

## 2020-09-29 ENCOUNTER — Encounter: Payer: Self-pay | Admitting: Obstetrics and Gynecology

## 2020-09-29 VITALS — BP 96/63 | HR 81 | Ht 69.0 in | Wt 201.2 lb

## 2020-09-29 DIAGNOSIS — Z30431 Encounter for routine checking of intrauterine contraceptive device: Secondary | ICD-10-CM

## 2020-09-29 NOTE — Progress Notes (Signed)
    GYNECOLOGY OFFICE ENCOUNTER NOTE  History:  29 y.o. [id number] here today for today for IUD string check; Mirena  IUD was placed  08/31/2020. Reports complaints about the IUD, lower abdominal pain, usually after sex, lasts for 1-2 days then resolves. Worried that her IUD has moved.  Has had issues before with an IUD in the remote past.   The following portions of the patient's history were reviewed and updated as appropriate: allergies, current medications, past family history, past medical history, past social history, past surgical history and problem list. Last pap smear on NILM, but HR HPV+ (neg types 16/18/45) was normal, negative HRHPV.  Review of Systems:  Pertinent items are noted in HPI.   Objective:  Physical Exam Blood pressure 96/63, pulse 81, height 5\' 9"  (1.753 m), weight 201 lb 3.2 oz (91.3 kg), not currently breastfeeding. CONSTITUTIONAL: Well-developed, well-nourished female in no acute distress.  ABDOMEN: Soft, no distention noted.   PELVIC: Normal appearing external genitalia; normal appearing vaginal mucosa and cervix.  IUD strings visualized, about 2 cm in length outside cervix. EXTREMITIES: extremities normal, atraumatic, no cyanosis or edema NEUROLOGIC: Grossly normal  Assessment & Plan:  - Patient to keep IUD in place for up to six years; can come in for removal if she desires pregnancy earlier or for any concerning side effects. Given reassurance that IUD was in place.  - Advised to take Tylenol/Ibuprofen prior to intercourse.  If symptoms do not abate within 3 months to follow up.    , MD [facility]

## 2021-03-22 IMAGING — US US OB COMP LESS 14 WK
1 series · 14 of 28 positions shown · non-contrast
Comparison: None.

CLINICAL DATA: Pelvic pain for 1 day with positive pregnancy test

EXAM:
OBSTETRIC <14 WK ULTRASOUND
TECHNIQUE: Transabdominal ultrasound was performed for evaluation of the
gestation as well as the maternal uterus and adnexal regions.

[Series 1: us ob comp less 14 wk · 14 of 42 slices shown]
[im 2/42]
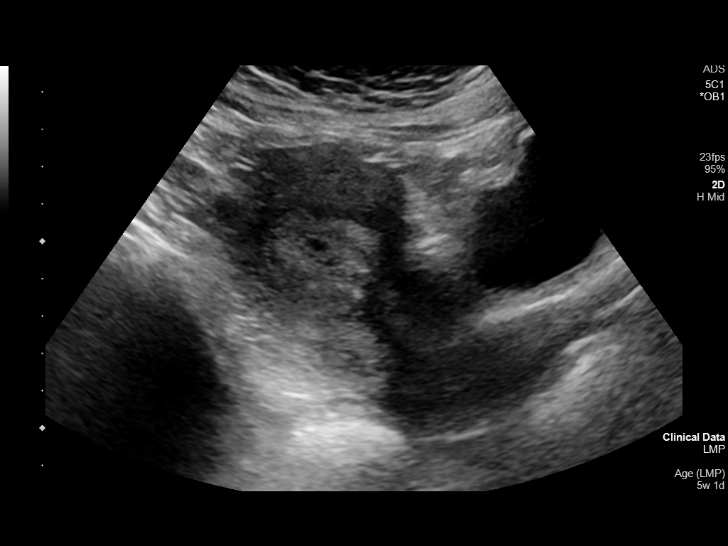
[im 5/42]
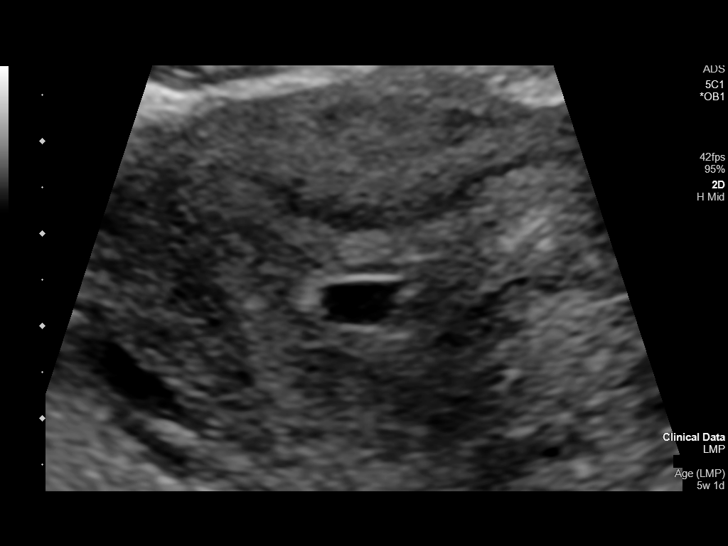
[im 8/42]
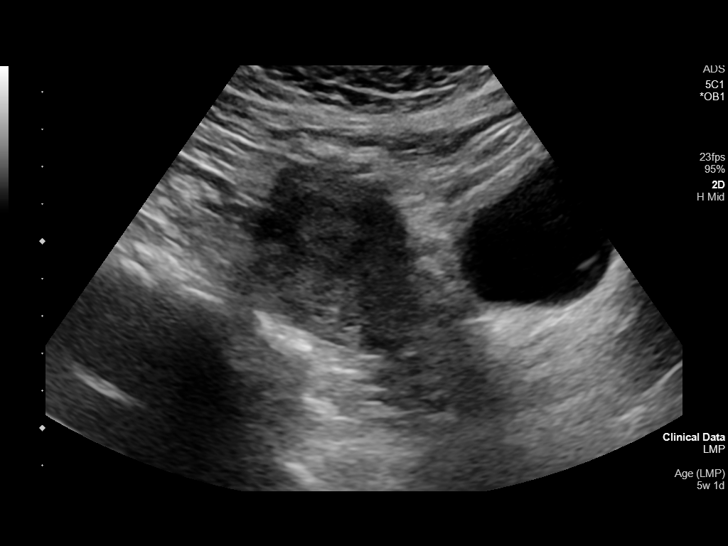
[im 11/42]
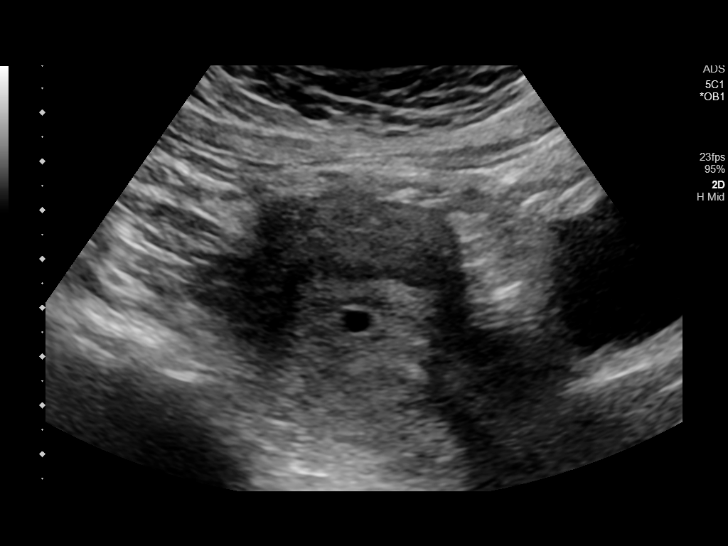
[im 14/42]
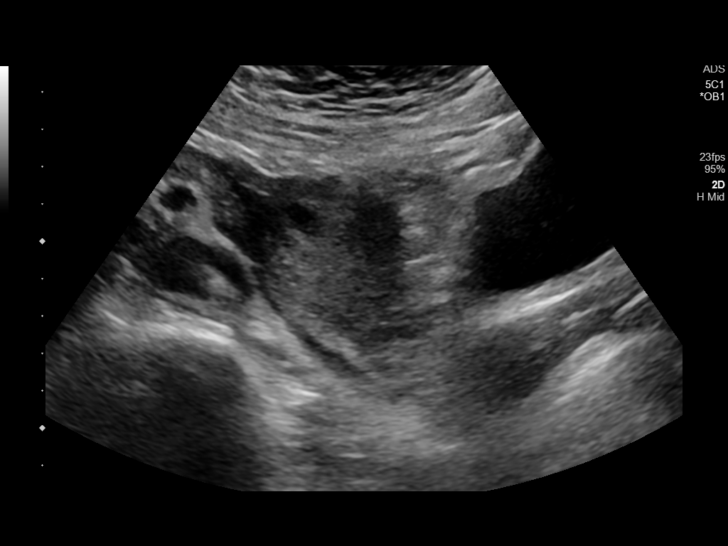
[im 17/42]
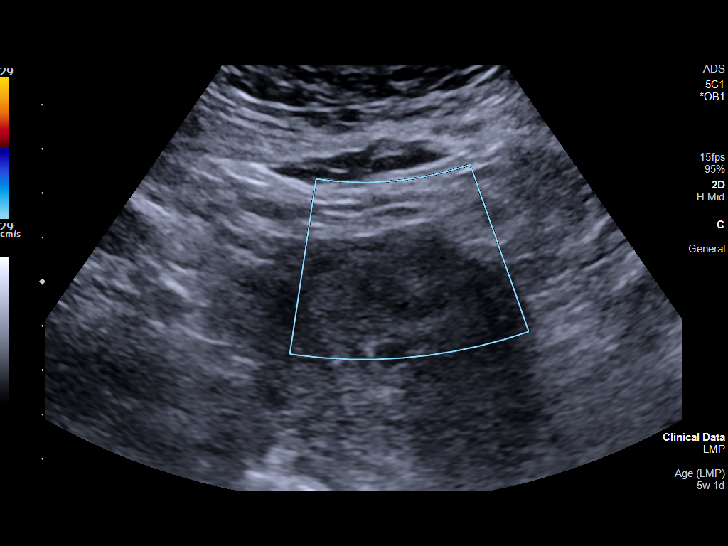
[im 20/42]
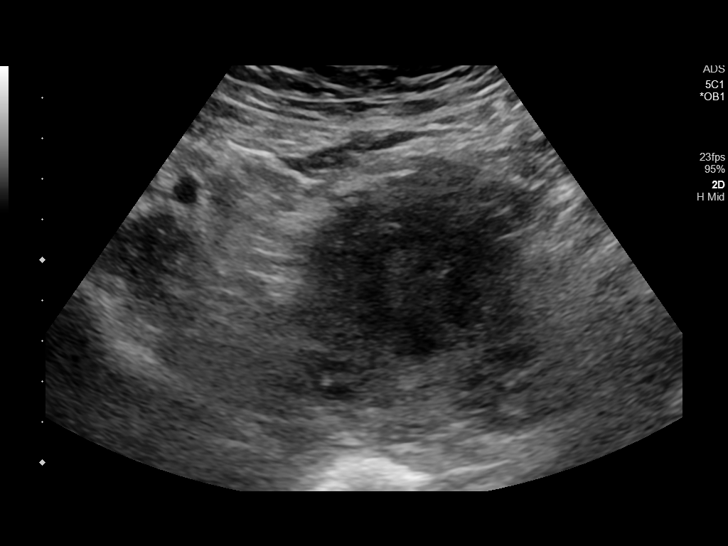
[im 23/42]
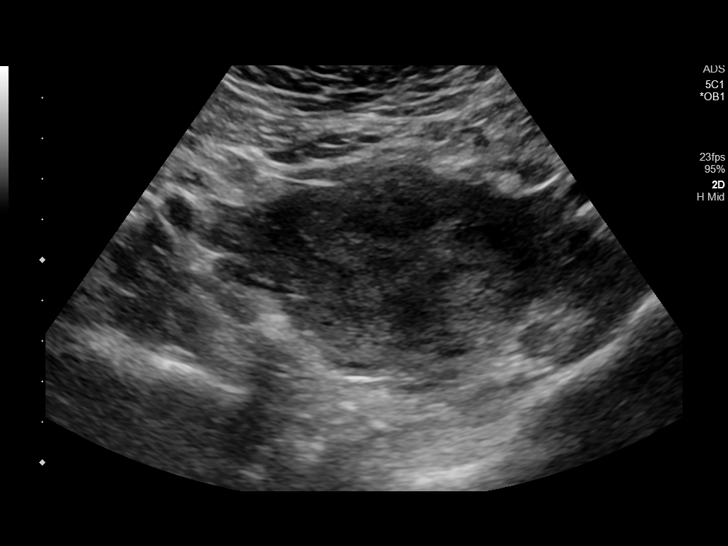
[im 26/42]
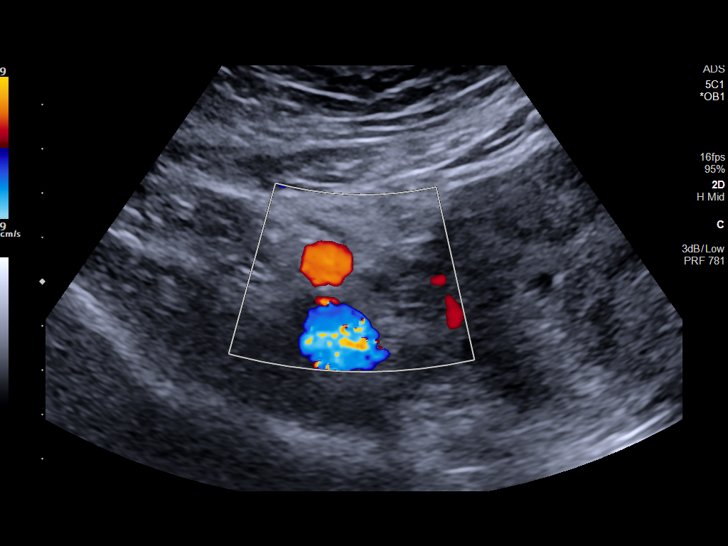
[im 29/42]
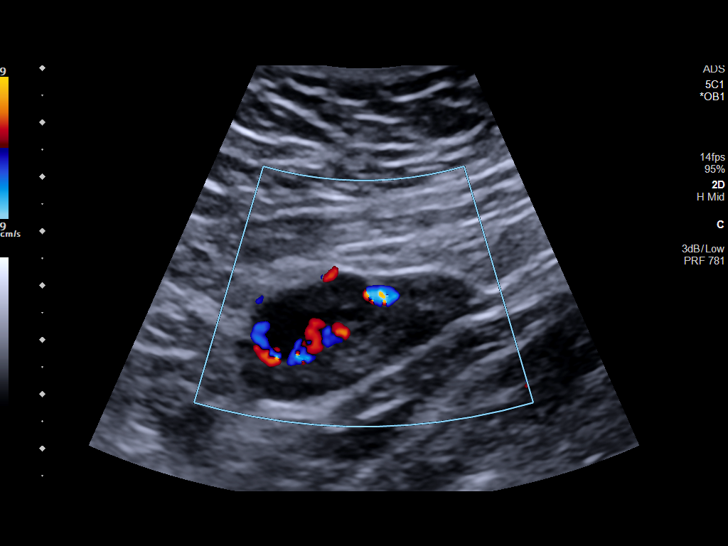
[im 32/42]
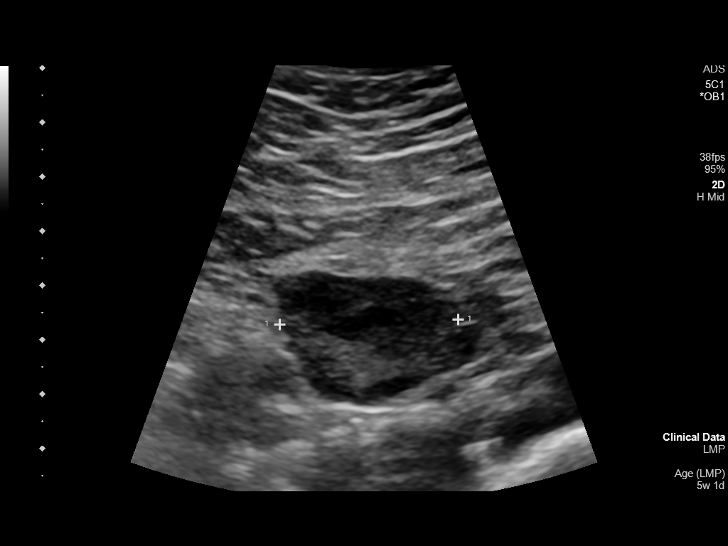
[im 35/42]
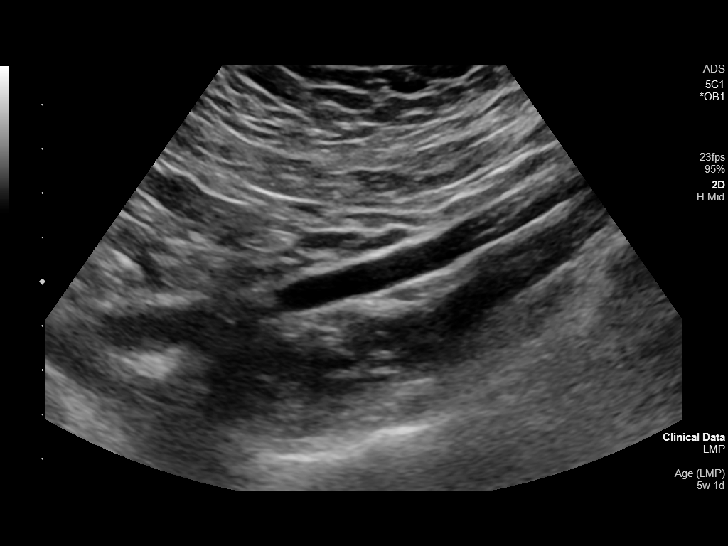
[im 38/42]
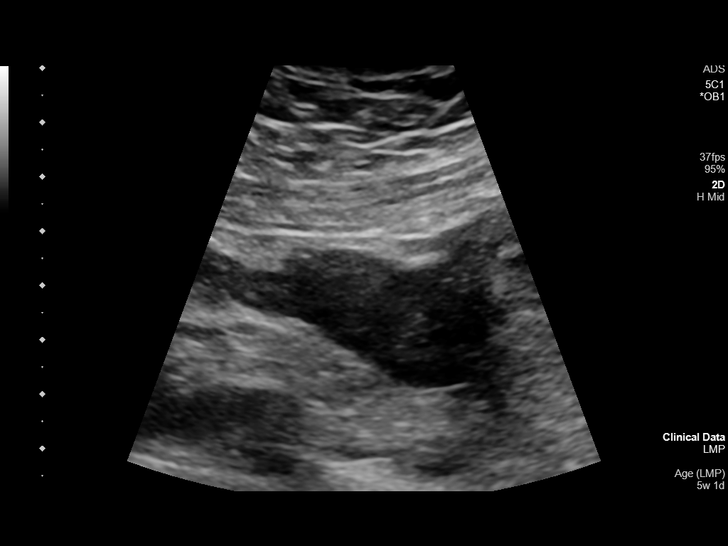
[im 42/42]
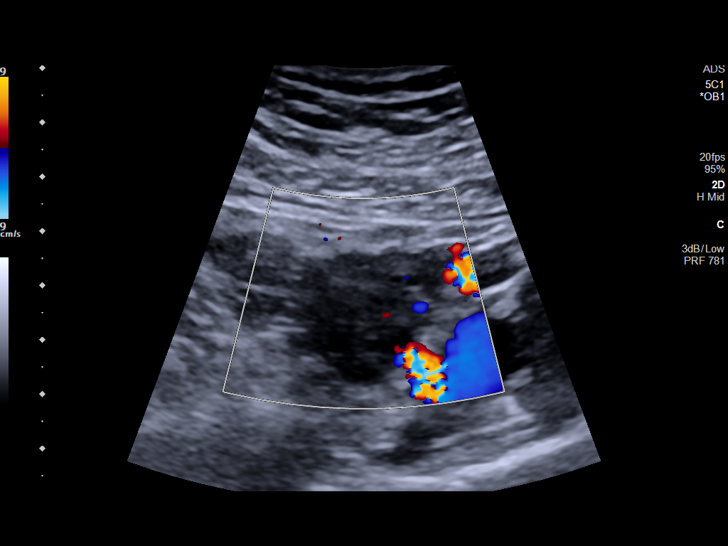

[14 of 28 positions shown; findings below may reference images not displayed]

FINDINGS: Intrauterine gestational sac: Present

Yolk sac:  Absent

Embryo:  Absent

MSD:  6.2 mm mm   5 w   2 d

Subchorionic hemorrhage:  None visualized.

Maternal uterus/adnexae: Small cystic lesion within the right ovary
is noted which may represent a corpus luteum cyst. 2 cm isoechoic
structure is noted in the anterior aspect of the uterine wall likely
representing a small fibroid.
IMPRESSION: Probable early intrauterine gestational sac, but no yolk sac, fetal
pole, or cardiac activity yet visualized. Recommend follow-up
quantitative B-HCG levels and follow-up US in 14 days to assess
viability. This recommendation follows SRU consensus guidelines:
Diagnostic Criteria for Nonviable Pregnancy Early in the First
Trimester. N Engl J Med 7441; [DATE].

## 2021-07-07 ENCOUNTER — Telehealth: Payer: Medicaid Other | Admitting: Obstetrics and Gynecology

## 2021-07-07 NOTE — Telephone Encounter (Signed)
Patient called no answer and no vm picked up operator stated that all circuits are down. Will send pt a mychart message.

## 2021-07-07 NOTE — Telephone Encounter (Signed)
Patient states that she thinks that she has a bacterial infection.  She is requesting medication.  She also thinks that her IUD has broke..she had what she thought was a period but it was heavier and she has been bleeding now for 2 weeks.  She states that she tried having sex with her husband and he could feel the IUD and had to stop the sex because it hurt him.  She stated that in the past he could not feel it.

## 2021-07-08 NOTE — Telephone Encounter (Signed)
Please advise. Thanks Fong Mccarry 

## 2021-07-08 NOTE — Telephone Encounter (Signed)
Spoke to pt concerning her IUD issues. Pt stated that she was having heavy bleeding and her husband is stating that the iud hurts him. Pt is concerned that the IUD maybe out of place. Pt was advised to use a back up method of birth control until her appointment. Pt was scheduled for 08/19/2021 the next available appointment. Pt is asking to come in sooner. Informed patient that I would place her on the cancellation list.

## 2021-07-12 NOTE — Telephone Encounter (Signed)
Pt called to schedule her for an ultrasound. Pt was asked to hold to be transferred to the front desk for scheduling. Pt hung up. Tried to call pt back no answer. Joni Reining stated that she would try to contact pt back.

## 2021-07-19 NOTE — Progress Notes (Signed)
GYNECOLOGY PROGRESS NOTE  Subjective:    Patient ID: Hannah Ball, female    DOB: Mar 23, 1991, 30 y.o.   MRN: [id number]  HPI  Patient is a 30 y.o. G5P4004 female who presents for issues with IUD. She has been having heavy bleeding and her partner feels her IUD strings. Notes that it is interfering with her sex life.  Has had IUD in place for ~ 1 year.   The following portions of the patient's history were reviewed and updated as appropriate: allergies, current medications, past family history, past medical history, past social history, past surgical history, and problem list.  Review of Systems Pertinent items noted in HPI and remainder of comprehensive ROS otherwise negative.   Objective:   Blood pressure 98/64, pulse 74, resp. rate 16, height 5\' 8"  (1.727 m), weight 214 lb 14.4 oz (97.5 kg), last menstrual period 06/03/2021, not currently breastfeeding.  Body mass index is 32.68 kg/m.  General appearance: alert and no distress Abdomen: soft, non-tender; bowel sounds normal; no masses,  no organomegaly Pelvic: external genitalia normal, rectovaginal septum normal.  Vagina without discharge.  Cervix normal appearing, no lesions and no motion tenderness. IUD threads visualized, lengthened.   Uterus mobile, nontender, normal shape and size.  Adnexae non-palpable, nontender bilaterally.  Extremities: extremities normal, atraumatic, no cyanosis or edema Neurologic: Grossly normal   Imaging:  08/03/2021 PELVIS TRANSVAGINAL NON-OB (TV ONLY) Patient Name: Hannah Ball DOB: 07-13-1991 MRN: 1991 ULTRASOUND REPORT  Location: [facility]  Date of Service: 07/20/2021   Indications:Pelvic Pain, Breakthrough Bleeding with IUD  Findings:  The uterus is anteverted and measures 9.6cm in length. Echo texture is heterogenous without evidence of focal masses.  The Endometrium measures 3.1 mm and shows an IUD lying in or near the LUS.  Right Ovary measures  3.1 x 1.9 x 2.3 cm. It is normal in appearance. Left Ovary is not visualized. Survey of the adnexa demonstrates no adnexal masses. There is no free fluid in the cul de sac.  Impression: 1. Likely IUD migration into lower uterine segment.   Recommendations: 1.Clinical correlation with the patient's History and Physical Exam. 2.Pt to discuss treatment options with Dr.  07/22/2021  Henderson-Gainey, RDMS  I have reviewed this study and agree with documented findings.   Oliver Hum, MD [facility]  Assessment:   1. Malpositioned intrauterine device (IUD), initial encounter   2. Breakthrough bleeding associated with intrauterine device (IUD)      Plan:   - IUD removed today, see procedure note below.  - Reviewed all forms of birth control options available including abstinence; fertility period awareness methods; over the counter/barrier methods; hormonal contraceptive medication including pill, patch, ring, injection,contraceptive implant; hormonal and nonhormonal IUDs; permanent sterilization options including vasectomy and the various tubal sterilization modalities. Risks and benefits reviewed.  Questions were answered.  Information was given to patient to review.  Will use patch in interim but will likely proceed with tubal ligation.  Medicaid BTL papers signed today.  Will need to wait for 30 days for compliance then can proceed with scheduling. Advised on back up method of contraception as she is switching methods.    A total of 20 minutes were spent face-to-face with the patient during this encounter and over half of that time dealt with counseling and coordination of care.    GYNECOLOGY OFFICE PROCEDURE NOTE  TAWANA PASCH is a 30 y.o. [phone] here for Mirena IUD removal due to breakthrough  bleeding and malpositioning.  IUD Removal  Patient identified, informed consent performed, consent signed.  Patient was in the dorsal lithotomy position, normal  external genitalia was noted.  A speculum was placed in the patient's vagina, normal discharge was noted, no lesions. The cervix was visualized, no lesions, no abnormal discharge.  The strings of the IUD were grasped and pulled using ring forceps. The IUD was removed in its entirety.  Patient tolerated the procedure well.    Patient will use Xulane patch/tubal ligation for contraception. Routine preventative health maintenance measures emphasized.   Hildred Laser, MD [facility]

## 2021-07-20 ENCOUNTER — Ambulatory Visit (INDEPENDENT_AMBULATORY_CARE_PROVIDER_SITE_OTHER): Payer: Medicaid Other | Admitting: Obstetrics and Gynecology

## 2021-07-20 ENCOUNTER — Encounter: Payer: Self-pay | Admitting: Obstetrics and Gynecology

## 2021-07-20 ENCOUNTER — Other Ambulatory Visit: Payer: Self-pay

## 2021-07-20 ENCOUNTER — Other Ambulatory Visit (INDEPENDENT_AMBULATORY_CARE_PROVIDER_SITE_OTHER): Payer: Medicaid Other

## 2021-07-20 VITALS — BP 98/64 | HR 74 | Resp 16 | Ht 68.0 in | Wt 214.9 lb

## 2021-07-20 DIAGNOSIS — T8332XA Displacement of intrauterine contraceptive device, initial encounter: Secondary | ICD-10-CM

## 2021-07-20 DIAGNOSIS — N921 Excessive and frequent menstruation with irregular cycle: Secondary | ICD-10-CM | POA: Diagnosis not present

## 2021-07-20 DIAGNOSIS — Z975 Presence of (intrauterine) contraceptive device: Secondary | ICD-10-CM | POA: Diagnosis not present

## 2021-07-20 MED ORDER — XULANE 150-35 MCG/24HR TD PTWK: 1.0000 | MEDICATED_PATCH | TRANSDERMAL | 12 refills | Status: DC

## 2021-07-20 NOTE — Patient Instructions (Signed)
Ethinyl Estradiol; Norelgestromin Patches What is this medication? ETHINYL ESTRADIOL;NORELGESTROMIN (ETH in il es tra DYE ole; nor el JES troe min) prevents ovulation and pregnancy. It belongs to a group of medications called contraceptives. It is a combination of the hormones estrogen and progestin. This medicine may be used for other purposes; ask your health care provider or pharmacist if you have questions. COMMON BRAND NAME(S): Ortho Evra, Xulane What should I tell my care team before I take this medication? They need to know if you have or ever had any of these conditions: Abnormal vaginal bleeding Blood vessel disease or blood clots Breast, cervical, endometrial, ovarian, liver, or uterine cancer Diabetes (high blood sugar) Gallbladder disease Having surgery Heart disease or recent heart attack High blood pressure High cholesterol or triglycerides History of irregular heartbeat or heart valve problems Kidney disease Liver disease Migraine headaches Protein C/S deficiency Recently had a baby, miscarriage, or abortion Stroke Systemic lupus erythematosus (SLE) Tobacco smoker An unusual or allergic reaction to estrogens, progestins, other medications, foods, dyes, or preservatives Pregnant or trying to get pregnant Breast-feeding How should I use this medication? This patch is applied to the skin. Follow the directions on the prescription label. Apply to clean, dry, healthy skin on the buttock, abdomen, upper outer arm or upper torso, in a place where it will not be rubbed by tight clothing. Do not use lotions or other cosmetics on the site where the patch will go. Press the patch firmly in place for 10 seconds to ensure good contact with the skin. Change the patch every 7 days on the same day of the week for 3 weeks. You will then have a break from the patch for 1 week, after which you will apply a new patch. Do not use your medication more often than directed. Contact your care  team about the use of this medication in children. Special care may be needed. This medication has been used in female children who have started having menstrual periods. A patient package insert for the product will be given with each prescription and refill. Read this sheet carefully each time. The sheet may change frequently. Overdosage: If you think you have taken too much of this medicine contact a poison control center or emergency room at once. NOTE: This medicine is only for you. Do not share this medicine with others. What if I miss a dose? You will need to replace your patch once a week as directed. If your patch is lost or falls off, contact your care team for advice. You may need to use another form of birth control if your patch has been off for more than 1 day. What may interact with this medication? Do not take this medication with the following: Dasabuvir; ombitasvir; paritaprevir; ritonavir Ombitasvir; paritaprevir; ritonavir This medication may also interact with the following: Acetaminophen Antibiotics or medications for infections, especially rifampin, rifabutin, rifapentine, and possibly penicillins or tetracyclines Aprepitant or fosaprepitant Armodafinil Ascorbic acid (vitamin C) Barbiturate medications, such as phenobarbital or primidone Bosentan Certain antiviral medications for hepatitis, HIV or AIDS Certain medications for cancer treatment Certain medications for seizures like carbamazepine, clobazam, felbamate, lamotrigine, oxcarbazepine, phenytoin, rufinamide, topiramate Certain medications for treating high cholesterol Cyclosporine Dantrolene Elagolix Flibanserin Grapefruit juice Lesinurad Medications for diabetes Medications to treat fungal infections, such as griseofulvin, miconazole, fluconazole, ketoconazole, itraconazole, posaconazole or voriconazole Mifepristone Mitotane Modafinil Morphine Mycophenolate [facility] John's  wort Tamoxifen Temazepam Theophylline or aminophylline Thyroid hormones Tizanidine Tranexamic acid Ulipristal Warfarin This list may not describe   all possible interactions. Give your health care provider a list of all the medicines, herbs, non-prescription drugs, or dietary supplements you use. Also tell them if you smoke, drink alcohol, or use illegal drugs. Some items may interact with your medicine. What should I watch for while using this medication? Visit your care team for regular checks on your progress. You will need a regular breast and pelvic exam and Pap smear while on this medication. Use an additional method of contraception during the first cycle that you use this patch. If you have any reason to think you are pregnant, stop using this medication right away and contact your care team. If you are using this medication for hormone related problems, it may take several cycles of use to see improvement in your condition. Smoking increases the risk of getting a blood clot or having a stroke while you are using hormonal birth control, especially if you are older than 30 years old. You are strongly advised not to smoke. This medication can make your body retain fluid, making your fingers, hands, or ankles swell. Your blood pressure can go up. Contact your care team if you feel you are retaining fluid. This medication can make you more sensitive to the sun. Keep out of the sun. If you cannot avoid being in the sun, wear protective clothing and use sunscreen. Do not use sun lamps or tanning beds/booths. If you wear contact lenses and notice visual changes, or if the lenses begin to feel uncomfortable, consult your eye care specialist. In some women, tenderness, swelling, or minor bleeding of the gums may occur. Notify your dentist if this happens. Brushing and flossing your teeth regularly may help limit this. See your dentist regularly and inform your dentist of the medications you are  taking. If you are going to have elective surgery or an MRI, you may need to stop using this medication before the surgery or MRI. Consult your care team for advice. This medication does not protect you against HIV infection (AIDS) or any other sexually transmitted infections. What side effects may I notice from receiving this medication? Side effects that you should report to your care team as soon as possible: Allergic reactions-skin rash, itching, hives, swelling of the face, lips, tongue, or throat Blood clot-pain, swelling, or warmth in the leg, shortness of breath, chest pain Gallbladder problems-severe stomach pain, nausea, vomiting, fever Increase in blood pressure Liver injury-right upper belly pain, loss of appetite, nausea, light-colored stool, dark yellow or brown urine, yellowing skin or eyes, unusual weakness or fatigue New or worsening migraines or headaches Stroke-sudden numbness or weakness of the face, arm, or leg, trouble speaking, confusion, trouble walking, loss of balance or coordination, dizziness, severe headache, change in vision Unusual vaginal discharge, itching, or odor Worsening mood, feelings of depression Side effects that usually do not require medical attention (report to your care team if they continue or are bothersome): Breast pain or tenderness Dark patches of skin on the face or other sun-exposed areas Irregular menstrual cycles or spotting Nausea Weight gain This list may not describe all possible side effects. Call your doctor for medical advice about side effects. You may report side effects to FDA at 1-800-FDA-1088. Where should I keep my medication? Keep out of the reach of children and pets. Store at room temperature between 15 and 30 degrees C (59 and 86 degrees F). Keep the patch in its pouch until time of use. Throw away any unused medication after the expiration date. Dispose   of used patches properly. Since a used patch may still contain active  hormones, fold the patch in half so that it sticks to itself prior to disposal. Throw away in a place where children or pets cannot reach. NOTE: This sheet is a summary. It may not cover all possible information. If you have questions about this medicine, talk to your doctor, pharmacist, or health care provider.  2022 Elsevier/Gold Standard (2020-11-10 14:11:14)  

## 2021-08-03 NOTE — Telephone Encounter (Signed)
Pt was seen in office on 07/20/2021

## 2021-08-15 ENCOUNTER — Telehealth: Payer: Self-pay | Admitting: Obstetrics and Gynecology

## 2021-08-15 NOTE — Telephone Encounter (Signed)
Patient stated that she thinks she has a bacterial infection.  She states that she gets them all the time and was told at her last visit that if she got another one that she could call.  She stated that she didn't have time to come in she has to work all week

## 2021-08-15 NOTE — Telephone Encounter (Signed)
Please advise. Thanks Harshaan Whang 

## 2021-08-16 NOTE — Telephone Encounter (Signed)
She can have a prescription for Flagyl or Metrogel, and if that doesn't help, she will need to come in and can at least perform a self-swab.

## 2021-08-18 MED ORDER — METRONIDAZOLE 500 MG PO TABS: 500.0000 mg | ORAL_TABLET | Freq: Two times a day (BID) | ORAL | 0 refills | Status: DC

## 2021-08-18 NOTE — Addendum Note (Signed)
Addended by: Silvano Bilis on: 08/18/2021 03:43 PM   Modules accepted: Orders

## 2021-08-18 NOTE — Telephone Encounter (Signed)
Spoke to pt concerning her call to the office. Informed pt of the information given by [facility] concerning that she could take Metrogel or Flagyl. Pt stated that she would like Flagyl. Medication sent to [facility] on [address]

## 2021-10-30 NOTE — L&D Delivery Note (Signed)
Delivery Summary for Hannah Ball  Labor Events:   Preterm labor: No data found  Rupture date: 08/23/2022  Rupture time: 8:33 PM  Rupture type: Artificial Intact  Fluid Color: Clear  Induction: No data found  Augmentation: No data found  Complications: No data found  Cervical ripening: No data found No data found   No data found     Delivery:   Episiotomy: No data found  Lacerations: No data found  Repair suture: No data found  Repair # of packets: No data found  Blood loss (ml): 250   Information for the patient's newborn:  Carla, Rashad [[id number]]   Delivery 08/23/2022 10:07 PM by  Vaginal, Spontaneous Sex:  female Gestational Age: [redacted]w[redacted]d Delivery Clinician:   Living?:         APGARS  One minute Five minutes Ten minutes  Skin color:        Heart rate:        Grimace:        Muscle tone:        Breathing:        Totals: 9  9      Presentation/position:      Resuscitation:   Cord information:    Disposition of cord blood:     Blood gases sent?  Complications:   Placenta: Delivered:       appearance Newborn Measurements: Weight: 8 lb 0.4 oz (3640 g)  Height: 20"  Head circumference:    Chest circumference:    Other providers:    Additional  information: Forceps:   Vacuum:   Breech:   Observed anomalies       Delivery Note At 10:07 PM a viable and healthy female was delivered via Vaginal, Spontaneous (precipitously, ~ 2 hours) (Presentation:  Occiput Anterior).  APGAR: 8, 9; weight  3640 grams.   Placenta status: Spontaneous, Intact.  Cord: 3 vessels with the following complications:  nuchal cord x 1, reducible after delivery of fetus.  Cord pH: not obtained.  Anesthesia: Epidural Episiotomy: None Lacerations: None Suture Repair: None Est. Blood Loss (mL): 250  Mom to postpartum.  Baby to Couplet care / Skin to Skin.  Rubie Maid, MD 08/23/2022, 10:32 PM

## 2021-12-19 ENCOUNTER — Telehealth: Payer: Self-pay | Admitting: Obstetrics and Gynecology

## 2021-12-19 NOTE — Telephone Encounter (Signed)
Pt called stating that she took 6 pregnancy test over the weekend and that she is currently on the patch- she is requesting to come in today to be seen- or to see if she could at least have labwork as Monday is her only day off. Please Advise.

## 2021-12-19 NOTE — Telephone Encounter (Signed)
Pt called back to go ahead and schedule apt- I sent cherry a message confirming I can use 2:45 as she has a meeting that day/.

## 2021-12-20 ENCOUNTER — Other Ambulatory Visit: Payer: Self-pay

## 2021-12-20 ENCOUNTER — Ambulatory Visit (INDEPENDENT_AMBULATORY_CARE_PROVIDER_SITE_OTHER): Payer: Medicaid Other | Admitting: Obstetrics and Gynecology

## 2021-12-20 ENCOUNTER — Encounter: Payer: Self-pay | Admitting: Obstetrics and Gynecology

## 2021-12-20 VITALS — BP 107/70 | HR 121 | Resp 16 | Ht 67.5 in | Wt 211.7 lb

## 2021-12-20 DIAGNOSIS — O3680X0 Pregnancy with inconclusive fetal viability, not applicable or unspecified: Secondary | ICD-10-CM

## 2021-12-20 DIAGNOSIS — R000 Tachycardia, unspecified: Secondary | ICD-10-CM | POA: Diagnosis not present

## 2021-12-20 DIAGNOSIS — N912 Amenorrhea, unspecified: Secondary | ICD-10-CM | POA: Diagnosis not present

## 2021-12-20 DIAGNOSIS — Z3201 Encounter for pregnancy test, result positive: Secondary | ICD-10-CM | POA: Diagnosis not present

## 2021-12-20 MED ORDER — DOXYLAMINE-PYRIDOXINE 10-10 MG PO TBEC: 2.0000 | DELAYED_RELEASE_TABLET | Freq: Every day | ORAL | 5 refills | Status: DC

## 2021-12-20 NOTE — Progress Notes (Signed)
° ° °  GYNECOLOGY PROGRESS NOTE  Subjective:    Patient ID: Hannah Ball, female    DOB: February 08, 1991, 31 y.o.   MRN: [id number]  HPI  Patient is a 31 y.o. G62P4004 female who presents for evaluation of amenorrhea. She believes she could be pregnant. Pregnancy is desired. Pregnancy was unplanned. Sexual Activity: 1 partner (husband)   Contraception: Marilu Favre. Missed 2 weeks in January as her pharmacy noted that they were out of stock (despite being notified 2-3 times that it was available).  Current symptoms also include: 8 Positive home pregnancy tests, breast tenderness. Last menstrual period was normal 11/19/2021.    The following portions of the patient's history were reviewed and updated as appropriate: allergies, current medications, past family history, past medical history, past social history, past surgical history, and problem list.  Review of Systems Pertinent items noted in HPI and remainder of comprehensive ROS otherwise negative.     Objective:    BP 107/70    Pulse (!) 121    Resp 16    Ht 5' 7.5" (1.715 m)    Wt 211 lb 11.2 oz (96 kg)    LMP 11/19/2021 (Approximate)    Breastfeeding No    BMI 32.67 kg/m  General: alert, no distress, and no acute distress    Lab Review Urine HCG: positive    Assessment:   1. Pregnancy confirmed by positive urine test   2. Amenorrhea   3. Pregnancy with uncertain fetal viability, single or unspecified fetus   4. Tachycardia      Plan:   - Pregnancy Test: Positive: EDC: 08/26/2022, with EGA 4.3 weeks. Briefly discussed pre-natal care options. Encouraged well-balanced diet, plenty of rest when needed, pre-natal vitamins daily and walking for exercise. Discussed self-help for nausea, avoiding OTC medications until consulting provider or pharmacist, other than Tylenol as needed, minimal caffeine (1-2 cups daily) and avoiding alcohol. Desires prescription for Dicelgis as this helped with her nausea/vomiting during her last pregnancy.   She will schedule her initial OB intake in 3 weeks.  - Ultrasound ordered for viability scan.  - Tachycardia, patient asymptomatic, no obvious cause. Will continue to monitor vitals at next visit.    Rubie Maid, MD [facility]

## 2021-12-21 LAB — POCT URINE PREGNANCY: Preg Test, Ur: POSITIVE — AB

## 2022-01-03 ENCOUNTER — Other Ambulatory Visit: Payer: Self-pay

## 2022-01-03 ENCOUNTER — Ambulatory Visit (INDEPENDENT_AMBULATORY_CARE_PROVIDER_SITE_OTHER): Payer: Medicaid Other

## 2022-01-03 DIAGNOSIS — O3680X0 Pregnancy with inconclusive fetal viability, not applicable or unspecified: Secondary | ICD-10-CM

## 2022-01-16 ENCOUNTER — Ambulatory Visit (INDEPENDENT_AMBULATORY_CARE_PROVIDER_SITE_OTHER): Payer: Medicaid Other | Admitting: Obstetrics and Gynecology

## 2022-01-16 ENCOUNTER — Other Ambulatory Visit: Payer: Self-pay

## 2022-01-16 VITALS — BP 101/63 | HR 83 | Ht 67.0 in | Wt 211.4 lb

## 2022-01-16 DIAGNOSIS — R309 Painful micturition, unspecified: Secondary | ICD-10-CM

## 2022-01-16 DIAGNOSIS — Z3481 Encounter for supervision of other normal pregnancy, first trimester: Secondary | ICD-10-CM

## 2022-01-16 DIAGNOSIS — R8290 Unspecified abnormal findings in urine: Secondary | ICD-10-CM | POA: Diagnosis not present

## 2022-01-16 DIAGNOSIS — R399 Unspecified symptoms and signs involving the genitourinary system: Secondary | ICD-10-CM

## 2022-01-16 DIAGNOSIS — Z3A08 8 weeks gestation of pregnancy: Secondary | ICD-10-CM | POA: Diagnosis not present

## 2022-01-16 DIAGNOSIS — Z113 Encounter for screening for infections with a predominantly sexual mode of transmission: Secondary | ICD-10-CM

## 2022-01-16 LAB — POCT URINALYSIS DIPSTICK OB
Bilirubin, UA: NEGATIVE
Glucose, UA: NEGATIVE
Ketones, UA: NEGATIVE
Nitrite, UA: NEGATIVE
POC,PROTEIN,UA: NEGATIVE
Spec Grav, UA: 1.02 (ref 1.010–1.025)
Urobilinogen, UA: 0.2 E.U./dL
pH, UA: 6.5 (ref 5.0–8.0)

## 2022-01-16 MED ORDER — NITROFURANTOIN MONOHYD MACRO 100 MG PO CAPS: 100.0000 mg | ORAL_CAPSULE | Freq: Two times a day (BID) | ORAL | 0 refills | Status: DC

## 2022-01-16 NOTE — Addendum Note (Signed)
Addended by: Georgiana Shore R on: 01/16/2022 11:14 AM ? ? Modules accepted: Level of Service ? ?

## 2022-01-16 NOTE — Progress Notes (Signed)
Hannah Ball presents for NOB nurse intake visit. Pregnancy confirmation done at [facility] on 12/20/21 with Dr.Cherry  G.5 P.4004  LMP. 11/19/21 EDD. 08/26/22 Ga [redacted]w[redacted]d. Pregnancy education material explained and given.  0 cats in the home.  NOB labs ordered. BMI greater than 30. TSH/HbgA1c ordered. Sickle cell not ordered due to race. HIV and drug screen explained  Genetic screening discussed. Genetic testing; Next visit. Pt to discuss genetic testing with provider. PNV encouraged. Pt to follow up with provider in 3-4 weeks for NOB physical.  FMLA,[facility] Financial Policy and HIV/Drug all reviewed and signed by patient.  ? ?Patient stated UTI symptoms of fever, cloudy urine, strong odor and pelvic discomfort. Urine dipstick showed positive for leuks and blood. Macrobid sent to pharmacy per doctor in office.  ? ?

## 2022-01-17 LAB — VARICELLA ZOSTER ANTIBODY, IGG: Varicella zoster IgG: 1593 index (ref >165)

## 2022-01-17 LAB — RUBELLA SCREEN: Rubella Antibodies, IGG: 1.08 index (ref >0.99)

## 2022-01-17 LAB — VIRAL HEPATITIS HBV, HCV
HCV Ab: NONREACTIVE
Hep B Core Total Ab: NEGATIVE
Hep B Surface Ab, Qual: NONREACTIVE
Hepatitis B Surface Ag: NEGATIVE

## 2022-01-17 LAB — URINALYSIS, ROUTINE W REFLEX MICROSCOPIC
Bilirubin, UA: NEGATIVE
Glucose, UA: NEGATIVE
Nitrite, UA: NEGATIVE
Specific Gravity, UA: >=1.03 — AB (ref 1.005–1.030)
Urobilinogen, Ur: 1 mg/dL (ref 0.2–1.0)
pH, UA: 6 (ref 5.0–7.5)

## 2022-01-17 LAB — MICROSCOPIC EXAMINATION
Casts: NONE SEEN /lpf
Epithelial Cells (non renal): >10 /hpf — AB (ref 0–10)

## 2022-01-17 LAB — ANTIBODY SCREEN: Antibody Screen: NEGATIVE

## 2022-01-17 LAB — HIV ANTIBODY (ROUTINE TESTING W REFLEX): HIV Screen 4th Generation wRfx: NONREACTIVE

## 2022-01-17 LAB — ABO AND RH: Rh Factor: NEGATIVE

## 2022-01-17 LAB — RPR: RPR Ser Ql: NONREACTIVE

## 2022-01-17 LAB — HCV INTERPRETATION

## 2022-01-18 LAB — URINE CULTURE, OB REFLEX

## 2022-01-18 LAB — GC/CHLAMYDIA PROBE AMP
Chlamydia trachomatis, NAA: NEGATIVE
Neisseria Gonorrhoeae by PCR: NEGATIVE

## 2022-01-18 LAB — CULTURE, OB URINE

## 2022-01-20 ENCOUNTER — Telehealth: Payer: Self-pay | Admitting: Obstetrics and Gynecology

## 2022-01-20 NOTE — Telephone Encounter (Signed)
Pt stated she has been taking the antibiotic prescribed for her UTI. Pt stated she does not feel any relief. Pt stated she is having a fever off and on and is still very uncomfortable. Please advise.  ?

## 2022-01-21 ENCOUNTER — Encounter: Payer: Self-pay | Admitting: Obstetrics and Gynecology

## 2022-01-21 NOTE — Telephone Encounter (Signed)
Her urine culture was negative. I encouraged increasing her fluid intake and taking AZO, can take Tylenol as needed.  If symptoms worsen, should f/u with Urgent Care

## 2022-01-23 MED ORDER — ONDANSETRON 4 MG PO TBDP: 4.0000 mg | ORAL_TABLET | Freq: Four times a day (QID) | ORAL | 0 refills | Status: DC | PRN

## 2022-01-25 LAB — PAIN MGT SCRN (14 DRUGS), UR

## 2022-01-25 LAB — NICOTINE SCREEN, URINE

## 2022-01-26 NOTE — Telephone Encounter (Signed)
Patient called.  Patient aware.  

## 2022-02-14 ENCOUNTER — Ambulatory Visit (INDEPENDENT_AMBULATORY_CARE_PROVIDER_SITE_OTHER): Payer: Medicaid Other | Admitting: Obstetrics and Gynecology

## 2022-02-14 VITALS — BP 95/58 | HR 95 | Ht 67.0 in | Wt 201.9 lb

## 2022-02-14 DIAGNOSIS — R399 Unspecified symptoms and signs involving the genitourinary system: Secondary | ICD-10-CM

## 2022-02-14 DIAGNOSIS — Z6791 Unspecified blood type, Rh negative: Secondary | ICD-10-CM

## 2022-02-14 DIAGNOSIS — Z124 Encounter for screening for malignant neoplasm of cervix: Secondary | ICD-10-CM

## 2022-02-14 DIAGNOSIS — Z1379 Encounter for other screening for genetic and chromosomal anomalies: Secondary | ICD-10-CM

## 2022-02-14 DIAGNOSIS — Z3A12 12 weeks gestation of pregnancy: Secondary | ICD-10-CM | POA: Diagnosis not present

## 2022-02-14 DIAGNOSIS — O09299 Supervision of pregnancy with other poor reproductive or obstetric history, unspecified trimester: Secondary | ICD-10-CM

## 2022-02-14 DIAGNOSIS — Z3481 Encounter for supervision of other normal pregnancy, first trimester: Secondary | ICD-10-CM

## 2022-02-14 DIAGNOSIS — O26899 Other specified pregnancy related conditions, unspecified trimester: Secondary | ICD-10-CM

## 2022-02-14 LAB — POCT URINALYSIS DIPSTICK OB
Bilirubin, UA: NEGATIVE
Glucose, UA: NEGATIVE
Ketones, UA: NEGATIVE
Nitrite, UA: NEGATIVE
Spec Grav, UA: 1.015 (ref 1.010–1.025)
Urobilinogen, UA: 0.2 E.U./dL
pH, UA: 8 (ref 5.0–8.0)

## 2022-02-14 MED ORDER — ONDANSETRON 4 MG PO TBDP: 4.0000 mg | ORAL_TABLET | Freq: Four times a day (QID) | ORAL | 0 refills | Status: DC | PRN

## 2022-02-14 NOTE — Progress Notes (Signed)
? ?OBSTETRIC INITIAL PRENATAL VISIT ? ?Subjective:  ? ? Hannah Ball is being seen today for her first obstetrical visit.  This is not a planned pregnancy. She is a 31 y.o. E3P2951 female at 73w3dgestation, Estimated Date of Delivery: 08/26/22 with Patient's last menstrual period was 11/19/2021 (approximate).,  consistent with 6 week sono. Her obstetrical history is significant for obesity. Relationship with FOB: spouse, living together. Patient does intend to breast feed. Pregnancy history fully reviewed. ? ?Patient reports that she was compliant with her recent antibiotics prescribed for suspected UTI, however is still noting some mild urinary symptoms.  However recent urine culture was negative. ? ?Also desires a refill on her Zofran.  Notes that overall nausea vomiting is getting better however still desires to have prescription just in case. ? ? ?OB History  ?Gravida Para Term Preterm AB Living  ?5 4 4  0 0 4  ?SAB IAB Ectopic Multiple Live Births  ?0 0 0 0 4  ?  ?# Outcome Date GA Lbr Len/2nd Weight Sex Delivery Anes PTL Lv  ?5 Current           ?4 Term 06/30/20 344w2d7:35 / 00:20 8 lb 12 oz (3.97 kg) F Vag-Spont EPI  LIV  ?   Name: YORUPAL, CHILDRESSESSICA  ?   Apgar1: 8  Apgar5: 9  ?3 Term 04/13/18 3930w1d8:05 / 00:05 8 lb 5.7 oz (3.79 kg) F Vag-Spont EPI  LIV  ?   Name: YOUMANDIE, CRABBESSICA  ?   Apgar1: 8  Apgar5: 9  ?2 Term 05/09/17 40w75w3d49 / 02:13 10 lb 12.1 oz (4.88 kg) M Vag-Vacuum EPI  LIV  ?   Birth Comments: WNL  ?   Name: YOUNLAKIE, MCLOUTHSICA  ?   Apgar1: 8  Apgar5: 9  ?1 Term 2013   7 lb 13 oz (3.544 kg) M Vag-Spont EPI  LIV  ?   Complications: Ovarian cyst during pregnancy  ? ? ?Gynecologic History:  ?Last pap smear was 08/06/2019.  Results were NILM, HR HPV+. Denies h/o other abnormal pap smears in the past.  ?Denies history of STIs.  ?Contraception prior to conception: NuvaRing ? ? ?Past Medical History:  ?Diagnosis Date  ? Migraines   ? Nausea/vomiting in pregnancy  10/26/2016  ? Trial of diclegis   ? Ovarian cyst   ? Rubella non-immune status, antepartum 08/14/2017  ? Needs MMR PP  ? Type A blood, Rh negative   ? ? ?Family History  ?Problem Relation Age of Onset  ? Diabetes Maternal Grandfather   ? Kidney disease Mother   ? ? ?Past Surgical History:  ?Procedure Laterality Date  ? LAPAROSCOPIC OVARIAN CYSTECTOMY    ? OVARIAN CYST REMOVAL  2008  ? ? ?Social History  ? ?Socioeconomic History  ? Marital status: Married  ?  Spouse name: ChriDarrick Meigs Number of children: Not on file  ? Years of education: Not on file  ? Highest education level: Not on file  ?Occupational History  ? Not on file  ?Tobacco Use  ? Smoking status: Never  ? Smokeless tobacco: Never  ?Vaping Use  ? Vaping Use: Some days  ?Substance and Sexual Activity  ? Alcohol use: Yes  ? Drug use: No  ? Sexual activity: Yes  ?  Partners: Male  ?Other Topics Concern  ? Not on file  ?Social History Narrative  ? ** Merged History Encounter **  ?    ? ?Social Determinants of Health  ? ?Financial  Resource Strain: Not on file  ?Food Insecurity: Not on file  ?Transportation Needs: Not on file  ?Physical Activity: Not on file  ?Stress: Not on file  ?Social Connections: Not on file  ?Intimate Partner Violence: Not on file  ? ? ?Current Outpatient Medications on File Prior to Visit  ?Medication Sig Dispense Refill  ? Doxylamine-Pyridoxine (DICLEGIS) 10-10 MG TBEC Take 2 tablets by mouth at bedtime. If symptoms persist, add one tablet in the morning and one in the afternoon 100 tablet 5  ? nitrofurantoin, macrocrystal-monohydrate, (MACROBID) 100 MG capsule Take 1 capsule (100 mg total) by mouth 2 (two) times daily. 14 capsule 0  ? ondansetron (ZOFRAN-ODT) 4 MG disintegrating tablet Take 1 tablet (4 mg total) by mouth every 6 (six) hours as needed for nausea. 20 tablet 0  ? Prenatal Vit-Fe Fumarate-FA (PRENATAL PO) Take by mouth.    ? ?No current facility-administered medications on file prior to visit.  ? ? ?Allergies  ?Allergen  Reactions  ? Benadryl [Diphenhydramine Hcl (Sleep)] Hives  ? Latex Hives  ? Neosporin Plus Max St Other (See Comments)  ?  Skin burns  ? Neosporin Wound Cleanser [[place] Chloride] Hives  ? Penicillins Rash  ?  Has patient had a PCN reaction causing immediate rash, facial/tongue/throat swelling, SOB or lightheadedness with hypotension: Yes ?Has patient had a PCN reaction causing severe rash involving mucus membranes or skin necrosis: No ?Has patient had a PCN reaction that required hospitalization No ?Has patient had a PCN reaction occurring within the last 10 years: No ?If all of the above answers are "NO", then may proceed with Cephalosporin use. ?  ? ? ? ?Review of Systems ?General: Not Present- Fever, Weight Loss and Weight Gain. ?Skin: Not Present- Rash. ?HEENT: Not Present- Blurred Vision, Headache and Bleeding Gums. ?Respiratory: Not Present- Difficulty Breathing. ?Breast: Not Present- Breast Mass. ?Cardiovascular: Not Present- Chest Pain, Elevated Blood Pressure, Fainting / Blacking Out and Shortness of Breath. ?Gastrointestinal: Not Present- Abdominal Pain, Constipation, Nausea and Vomiting. ?Female Genitourinary: Not Present- Frequency, Pelvic Pain, Vaginal Bleeding, Vaginal Discharge, Contractions, regular, Fetal Movements Decreased, Urinary Complaints and Vaginal Fluid. Present  Painful Urination (mild) ?Musculoskeletal: Not Present- Back Pain and Leg Cramps. ?Neurological: Not Present- Dizziness. ?Psychiatric: Not Present- Depression.  ? ?  ?Objective:  ? ?Blood pressure (!) 95/58, pulse 95, weight 201 lb 14.4 oz (91.6 kg), last menstrual period 11/19/2021, not currently breastfeeding.  Body mass index is 31.62 kg/m?. ? ?General Appearance:    Alert, cooperative, no distress, appears stated age  ?Head:    Normocephalic, without obvious abnormality, atraumatic  ?Eyes:    PERRL, conjunctiva/corneas clear, EOM's intact, both eyes  ?Ears:    Normal external ear canals, both ears  ?Nose:   Nares normal,  septum midline, mucosa normal, no drainage or sinus tenderness  ?Throat:   Lips, mucosa, and tongue normal; teeth and gums normal  ?Neck:   Supple, symmetrical, trachea midline, no adenopathy; thyroid: no enlargement/tenderness/nodules; no carotid bruit or JVD  ?Back:     Symmetric, no curvature, ROM normal, no CVA tenderness  ?Lungs:     Clear to auscultation bilaterally, respirations unlabored  ?Chest Wall:    No tenderness or deformity  ? Heart:    Regular rate and rhythm, S1 and S2 normal, no murmur, rub or gallop  ?Breast Exam:    No tenderness, masses, or nipple abnormality  ?Abdomen:     Soft, non-tender, bowel sounds active all four quadrants, no masses, no organomegaly.  FHT  152 bpm.  ?Genitalia:    Pelvic: Deferred  ?Rectal:    Normal external sphincter.  No hemorrhoids appreciated. Internal exam not done.   ?Extremities:   Extremities normal, atraumatic, no cyanosis or edema  ?Pulses:   2+ and symmetric all extremities  ?Skin:   Skin color, texture, turgor normal, no rashes or lesions  ?Lymph nodes:   Cervical, supraclavicular, and axillary nodes normal  ?Neurologic:   CNII-XII intact, normal strength, sensation and reflexes throughout  ? ?  ?Assessment:  ? ?1. Encounter for supervision of other normal pregnancy in first trimester   ?2. [redacted] weeks gestation of pregnancy   ?3. Genetic screening   ?4. History of macrosomia in infant in prior pregnancy, currently pregnant   ?5. Rh negative, antepartum   ?6. UTI symptoms   ? ? ?Plan:  ? ?Supervision of normal other pregnancy  ?- Initial labs reviewed. ?- Prenatal vitamins encouraged. ?- Problem list reviewed and updated. ?- New OB counseling:  The patient has been given an overview regarding routine prenatal care.  Recommendations regarding diet, weight gain, and exercise in pregnancy were given. ?-Pap smear to be performed after pregnancy. ? ?- Benefits of Breast Feeding were discussed. The patient is encouraged to consider nursing her baby post partum. ? The  patient has Medicaid.  [facility] Medicaid Risk Screening Form completed today ? ? ?2. Genetic screening ?- Prenatal testing, optional genetic testing, and ultrasound use in pregnancy were reviewed.  [facility]

## 2022-02-14 NOTE — Progress Notes (Signed)
NOB Physical: She is doing well, no new concerns. Genetic screening done today. Medicaid form completed. ?

## 2022-02-15 LAB — CBC
Hematocrit: 38.4 % (ref 34.0–46.6)
Hemoglobin: 12.1 g/dL (ref 11.1–15.9)
MCH: 24.4 pg — ABNORMAL LOW (ref 26.6–33.0)
MCHC: 31.5 g/dL (ref 31.5–35.7)
MCV: 78 fL — ABNORMAL LOW (ref 79–97)
Platelets: 158 10*3/uL (ref 150–450)
RBC: 4.95 x10E6/uL (ref 3.77–5.28)
RDW: 15.5 % — ABNORMAL HIGH (ref 11.7–15.4)
WBC: 4.5 10*3/uL (ref 3.4–10.8)

## 2022-02-16 LAB — PAIN MGT SCRN (14 DRUGS), UR
Amphetamine Scrn, Ur: NEGATIVE ng/mL
BARBITURATE SCREEN URINE: NEGATIVE ng/mL
BENZODIAZEPINE SCREEN, URINE: NEGATIVE ng/mL
Buprenorphine, Urine: NEGATIVE ng/mL
CANNABINOIDS UR QL SCN: NEGATIVE ng/mL
Cocaine (Metab) Scrn, Ur: NEGATIVE ng/mL
Creatinine(Crt), U: 214.8 mg/dL (ref 20.0–300.0)
Fentanyl, Urine: NEGATIVE pg/mL
Meperidine Screen, Urine: NEGATIVE ng/mL
Methadone Screen, Urine: NEGATIVE ng/mL
OXYCODONE+OXYMORPHONE UR QL SCN: NEGATIVE ng/mL
Opiate Scrn, Ur: NEGATIVE ng/mL
Ph of Urine: 8 (ref 4.5–8.9)
Phencyclidine Qn, Ur: NEGATIVE ng/mL
Propoxyphene Scrn, Ur: NEGATIVE ng/mL
Tramadol Screen, Urine: NEGATIVE ng/mL

## 2022-02-28 ENCOUNTER — Encounter: Payer: Self-pay | Admitting: Obstetrics and Gynecology

## 2022-03-14 ENCOUNTER — Ambulatory Visit (INDEPENDENT_AMBULATORY_CARE_PROVIDER_SITE_OTHER): Payer: Medicaid Other | Admitting: Obstetrics and Gynecology

## 2022-03-14 ENCOUNTER — Other Ambulatory Visit: Payer: Self-pay | Admitting: Obstetrics and Gynecology

## 2022-03-14 ENCOUNTER — Telehealth: Payer: Self-pay | Admitting: Obstetrics and Gynecology

## 2022-03-14 ENCOUNTER — Encounter: Payer: Self-pay | Admitting: Obstetrics and Gynecology

## 2022-03-14 VITALS — BP 110/61 | HR 94 | Wt 205.8 lb

## 2022-03-14 DIAGNOSIS — Z3482 Encounter for supervision of other normal pregnancy, second trimester: Secondary | ICD-10-CM

## 2022-03-14 DIAGNOSIS — Z1379 Encounter for other screening for genetic and chromosomal anomalies: Secondary | ICD-10-CM

## 2022-03-14 DIAGNOSIS — Z111 Encounter for screening for respiratory tuberculosis: Secondary | ICD-10-CM

## 2022-03-14 DIAGNOSIS — Z3A16 16 weeks gestation of pregnancy: Secondary | ICD-10-CM

## 2022-03-14 DIAGNOSIS — Z3689 Encounter for other specified antenatal screening: Secondary | ICD-10-CM

## 2022-03-14 LAB — POCT URINALYSIS DIPSTICK OB
Bilirubin, UA: NEGATIVE
Glucose, UA: NEGATIVE
Ketones, UA: NEGATIVE
Nitrite, UA: NEGATIVE
Spec Grav, UA: 1.02 (ref 1.010–1.025)
Urobilinogen, UA: 0.2 E.U./dL
pH, UA: 6.5 (ref 5.0–8.0)

## 2022-03-14 NOTE — Progress Notes (Signed)
ROB. Doing well, no issues.  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 Needs TB testing.  Normal Panorama screen. For AFP today.  Anatomy scan ordered for next visit.  ?

## 2022-03-14 NOTE — Progress Notes (Signed)
ROB. Patient states  Patient states no questions or concerns at this time.   

## 2022-03-14 NOTE — Patient Instructions (Signed)
Second Trimester of Pregnancy  The second trimester of pregnancy is from week 13 through week 27. This is also called months 4 through 6 of pregnancy. This is often the time when you feel your best. During the second trimester: Morning sickness is less or has stopped. You may have more energy. You may feel hungry more often. At this time, your unborn baby (fetus) is growing very fast. At the end of the sixth month, the unborn baby may be up to 12 inches long and weigh about 1 pounds. You will likely start to feel the baby move between 16 and 20 weeks of pregnancy. Body changes during your second trimester Your body continues to go through many changes during this time. The changes vary and generally return to normal after the baby is born. Physical changes You will gain more weight. You may start to get stretch marks on your hips, belly (abdomen), and breasts. Your breasts will grow and may hurt. Dark spots or blotches may develop on your face. A dark line from your belly button to the pubic area (linea nigra) may appear. You may have changes in your hair. Health changes You may have headaches. You may have heartburn. You may have trouble pooping (constipation). You may have hemorrhoids or swollen, bulging veins (varicose veins). Your gums may bleed. You may pee (urinate) more often. You may have back pain. Follow these instructions at home: Medicines Take over-the-counter and prescription medicines only as told by your doctor. Some medicines are not safe during pregnancy. Take a prenatal vitamin that contains at least 600 micrograms (mcg) of folic acid. Eating and drinking Eat healthy meals that include: Fresh fruits and vegetables. Whole grains. Good sources of protein, such as meat, eggs, or tofu. Low-fat dairy products. Avoid raw meat and unpasteurized juice, milk, and cheese. You may need to take these actions to prevent or treat trouble pooping: Drink enough fluids to keep  your pee (urine) pale yellow. Eat foods that are high in fiber. These include beans, whole grains, and fresh fruits and vegetables. Limit foods that are high in fat and sugar. These include fried or sweet foods. Activity Exercise only as told by your doctor. Most people can do their usual exercise during pregnancy. Try to exercise for 30 minutes at least 5 days a week. Stop exercising if you have pain or cramps in your belly or lower back. Do not exercise if it is too hot or too humid, or if you are in a place of great height (high altitude). Avoid heavy lifting. If you choose to, you may have sex unless your doctor tells you not to. Relieving pain and discomfort Wear a good support bra if your breasts are sore. Take warm water baths (sitz baths) to soothe pain or discomfort caused by hemorrhoids. Use hemorrhoid cream if your doctor approves. Rest with your legs raised (elevated) if you have leg cramps or low back pain. If you develop bulging veins in your legs: Wear support hose as told by your doctor. Raise your feet for 15 minutes, 3-4 times a day. Limit salt in your food. Safety Wear your seat belt at all times when you are in a car. Talk with your doctor if someone is hurting you or yelling at you a lot. Lifestyle Do not use hot tubs, steam rooms, or saunas. Do not douche. Do not use tampons or scented sanitary pads. Avoid cat litter boxes and soil used by cats. These carry germs that can harm your baby   and can cause a loss of your baby by miscarriage or stillbirth. Do not use herbal medicines, illegal drugs, or medicines that are not approved by your doctor. Do not drink alcohol. Do not smoke or use any products that contain nicotine or tobacco. If you need help quitting, ask your doctor. General instructions Keep all follow-up visits. This is important. Ask your doctor about local prenatal classes. Ask your doctor about the right foods to eat or for help finding a  counselor. Where to find more information American Pregnancy Association: americanpregnancy.org American College of Obstetricians and Gynecologists: www.acog.org Office on Women's Health: womenshealth.gov/pregnancy Contact a doctor if: You have a headache that does not go away when you take medicine. You have changes in how you see, or you see spots in front of your eyes. You have mild cramps, pressure, or pain in your lower belly. You continue to feel like you may vomit (nauseous), you vomit, or you have watery poop (diarrhea). You have bad-smelling fluid coming from your vagina. You have pain when you pee or your pee smells bad. You have very bad swelling of your face, hands, ankles, feet, or legs. You have a fever. Get help right away if: You are leaking fluid from your vagina. You have spotting or bleeding from your vagina. You have very bad belly cramping or pain. You have trouble breathing. You have chest pain. You faint. You have not felt your baby move for the time period told by your doctor. You have new or increased pain, swelling, or redness in an arm or leg. Summary The second trimester of pregnancy is from week 13 through week 27 (months 4 through 6). Eat healthy meals. Exercise as told by your doctor. Most people can do their usual exercise during pregnancy. Do not use herbal medicines, illegal drugs, or medicines that are not approved by your doctor. Do not drink alcohol. Call your doctor if you get sick or if you notice anything unusual about your pregnancy. This information is not intended to replace advice given to you by your health care provider. Make sure you discuss any questions you have with your health care provider. Document Revised: 03/24/2020 Document Reviewed: 01/29/2020 Elsevier Patient Education  2023 Elsevier Inc.  

## 2022-03-14 NOTE — Addendum Note (Signed)
Addended by: Tommie Raymond on: 03/14/2022 10:33 AM ? ? Modules accepted: Orders ? ?

## 2022-03-14 NOTE — Telephone Encounter (Signed)
Patient called and stated she forgot to ask at her appointment today about a note from the physician for her dental appointment today. Patient stated that note should entail everything the dentist is able to perform for her while pregnant. Patient states that she is being seen for a routine cleaning, mouth exam, and xrays. Patient states the letter can be faxed to [facility]. Please advise.  ?

## 2022-03-17 LAB — AFP, SERUM, OPEN SPINA BIFIDA
AFP MoM: 0.93
AFP Value: 27.8 ng/mL
Gest. Age on Collection Date: 16.4 weeks
Maternal Age At EDD: 26.9 yr
OSBR Risk 1 IN: 10000
Test Results:: NEGATIVE
Weight: 205 [lb_av]

## 2022-03-28 ENCOUNTER — Encounter: Payer: Self-pay | Admitting: Obstetrics and Gynecology

## 2022-03-30 ENCOUNTER — Ambulatory Visit
Admission: EM | Admit: 2022-03-30 | Discharge: 2022-03-30 | Disposition: A | Discharge status: Home / Self Care | Payer: Medicaid Other | Attending: Family Medicine | Admitting: Family Medicine

## 2022-03-30 ENCOUNTER — Encounter: Payer: Self-pay | Admitting: Emergency Medicine

## 2022-03-30 DIAGNOSIS — J029 Acute pharyngitis, unspecified: Secondary | ICD-10-CM

## 2022-03-30 DIAGNOSIS — J0140 Acute pansinusitis, unspecified: Secondary | ICD-10-CM

## 2022-03-30 DIAGNOSIS — H6592 Unspecified nonsuppurative otitis media, left ear: Secondary | ICD-10-CM

## 2022-03-30 LAB — POCT RAPID STREP A (OFFICE): Rapid Strep A Screen: NEGATIVE

## 2022-03-30 MED ORDER — AZITHROMYCIN 250 MG PO TABS: ORAL_TABLET | ORAL | 0 refills | Status: DC

## 2022-03-30 NOTE — ED Provider Notes (Signed)
Roderic Palau    CSN: [id number] Arrival date & time: 03/30/22  6389      History   Chief Complaint Chief Complaint  Patient presents with   Otalgia   Sore Throat    HPI Hannah Ball is a 31 y.o. female.   HPI Patient presents for left ear pain x 4 days. Patient has been exposed to strep and she has mild sore throat. Nasal congestion ongoing for several days and she now has left ear pain which has worsened over the last 4 days. Sore throat started after she developed coughing. 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 She has been negative for fever. Tolerating fluids.  Past Medical History:  Diagnosis Date   Migraines    Nausea/vomiting in pregnancy 10/26/2016   Trial of diclegis    Ovarian cyst    Rubella non-immune status, antepartum 08/14/2017   Needs MMR PP   Type A blood, Rh negative     Patient Active Problem List   Diagnosis Date Noted   IUD (intrauterine device) in place 08/31/2020   COVID-19 12/07/2019   History of macrosomia in infant in prior pregnancy, currently pregnant 09/26/2017   Rh negative, antepartum 10/16/2016    Past Surgical History:  Procedure Laterality Date   LAPAROSCOPIC OVARIAN CYSTECTOMY     OVARIAN CYST REMOVAL  2008    OB History     Gravida  5   Para  4   Term  4   Preterm  0   AB  0   Living  4      SAB  0   IAB  0   Ectopic  0   Multiple  0   Live Births  4            Home Medications    Prior to Admission medications   Medication Sig Start Date End Date Taking? Authorizing Provider  azithromycin (ZITHROMAX) 250 MG tablet Take 2 tabs PO x 1 dose, then 1 tab PO QD x 4 days 03/30/22  Yes Scot Jun, FNP  Doxylamine-Pyridoxine (DICLEGIS) 10-10 MG TBEC Take 2 tablets by mouth at bedtime. If symptoms persist, add one tablet in the morning and one in the afternoon 12/20/21   Rubie Maid, MD  ondansetron (ZOFRAN-ODT) 4 MG disintegrating  tablet Take 1 tablet (4 mg total) by mouth every 6 (six) hours as needed for nausea. 02/14/22   Rubie Maid, MD  Prenatal Vit-Fe Fumarate-FA (PRENATAL PO) Take by mouth.    [provider]    Family History Family History  Problem Relation Age of Onset   Diabetes Maternal Grandfather    Kidney disease Mother     Social History Social History   Tobacco Use   Smoking status: Never   Smokeless tobacco: Never  Vaping Use   Vaping Use: Former  Substance Use Topics   Alcohol use: Not Currently   Drug use: No     Allergies   Benadryl [diphenhydramine hcl (sleep)], Latex, Neo-bacit-poly-lidocaine, Neosporin wound cleanser [benzalkonium chloride], and Penicillins   Review of Systems Review of Systems Pertinent negatives listed in HPI  Physical Exam Triage Vital Signs ED Triage Vitals  Enc Vitals Group     BP 03/30/22 0953 97/67     Pulse Rate 03/30/22 0953 93     Resp 03/30/22 0953 16     Temp 03/30/22 0953 98.8 F (37.1 C)  Temp Source 03/30/22 0953 Oral     SpO2 03/30/22 0953 96 %     Weight --      Height --      Head Circumference --      Peak Flow --      Pain Score 03/30/22 0955 9     Pain Loc --      Pain Edu? --      Excl. in [place]? --    No data found.  Updated Vital Signs BP 97/67 (BP Location: Left Arm)   Pulse 93   Temp 98.8 F (37.1 C) (Oral)   Resp 16   LMP 11/19/2021 (Approximate)   SpO2 96%   Visual Acuity Right Eye Distance:   Left Eye Distance:   Bilateral Distance:    Right Eye Near:   Left Eye Near:    Bilateral Near:     Physical Exam  General Appearance:    Alert, cooperative, no distress  HENT:   Normocephalic, ears normal, nares mucosal edema with congestion, rhinorrhea, oropharynx  mild erythema   Eyes:    PERRL, conjunctiva/corneas clear, EOM's intact       Lungs:     Clear to auscultation bilaterally, respirations unlabored  Heart:    Regular rate and rhythm  Neurologic:   Awake, alert, oriented x 3. No  apparent focal neurological           defect.      UC Treatments / Results  Labs (all labs ordered are listed, but only abnormal results are displayed) Labs Reviewed  POCT RAPID STREP A (OFFICE)    EKG   Radiology No results found.  Procedures Procedures (including critical care time)  Medications Ordered in UC Medications - No data to display  Initial Impression / Assessment and Plan / UC Course  I have reviewed the triage vital signs and the nursing notes.  Pertinent labs & imaging results that were available during my care of the patient were reviewed by me and considered in my medical decision making (see chart for details).    Acute sinusitis non recurrent  Sore Throat  Fluid behind the ear  Azithromycin x 5 days. Afrin PRN  Hydrate well with fluids. RTC PRN as needed. Final Clinical Impressions(s) / UC Diagnoses   Final diagnoses:  Acute non-recurrent pansinusitis  Sore throat  Fluid level behind tympanic membrane of left ear     Discharge Instructions      I did not prescribed Flonase as it interacts with one of your other allergies. You can use over the counter Afrin three times daily as needed for nasal congestion and ear pressure however discontinue use after three days as prolonged use can cause rebound congestion. Hydrate well with fluids.     ED Prescriptions     Medication Sig Dispense Auth. Provider   azithromycin (ZITHROMAX) 250 MG tablet Take 2 tabs PO x 1 dose, then 1 tab PO QD x 4 days 6 tablet Scot Jun, FNP      PDMP not reviewed this encounter.   Scot Jun, FNP 03/30/22 1016

## 2022-03-30 NOTE — ED Triage Notes (Signed)
Pt presents with left ear pain x 4 days. Pt has been exposed to strep and she has a mild ST.

## 2022-03-30 NOTE — Discharge Instructions (Addendum)
I did not prescribed Flonase as it interacts with one of your other allergies. You can use over the counter Afrin three times daily as needed for nasal congestion and ear pressure however discontinue use after three days as prolonged use can cause rebound congestion. Hydrate well with fluids.

## 2022-04-12 ENCOUNTER — Ambulatory Visit (INDEPENDENT_AMBULATORY_CARE_PROVIDER_SITE_OTHER): Payer: Medicaid Other

## 2022-04-12 ENCOUNTER — Ambulatory Visit (INDEPENDENT_AMBULATORY_CARE_PROVIDER_SITE_OTHER): Payer: Medicaid Other | Admitting: Obstetrics and Gynecology

## 2022-04-12 ENCOUNTER — Encounter: Payer: Self-pay | Admitting: Obstetrics and Gynecology

## 2022-04-12 ENCOUNTER — Other Ambulatory Visit: Payer: Medicaid Other

## 2022-04-12 VITALS — BP 92/63 | HR 81 | Wt 205.5 lb

## 2022-04-12 DIAGNOSIS — Z3A20 20 weeks gestation of pregnancy: Secondary | ICD-10-CM

## 2022-04-12 DIAGNOSIS — Z3689 Encounter for other specified antenatal screening: Secondary | ICD-10-CM

## 2022-04-12 DIAGNOSIS — Z3A21 21 weeks gestation of pregnancy: Secondary | ICD-10-CM

## 2022-04-12 DIAGNOSIS — Z111 Encounter for screening for respiratory tuberculosis: Secondary | ICD-10-CM | POA: Diagnosis not present

## 2022-04-12 DIAGNOSIS — Z3482 Encounter for supervision of other normal pregnancy, second trimester: Secondary | ICD-10-CM

## 2022-04-12 LAB — POCT URINALYSIS DIPSTICK OB
Bilirubin, UA: NEGATIVE
Glucose, UA: NEGATIVE
Ketones, UA: NEGATIVE
Nitrite, UA: NEGATIVE
POC,PROTEIN,UA: NEGATIVE
Spec Grav, UA: 1.02 (ref 1.010–1.025)
Urobilinogen, UA: 0.2 E.U./dL
pH, UA: 6 (ref 5.0–8.0)

## 2022-04-12 NOTE — Addendum Note (Signed)
Addended by: Loney Laurence on: 04/12/2022 08:16 AM   Modules accepted: Orders

## 2022-04-12 NOTE — Addendum Note (Signed)
Addended by: Tommie Raymond on: 04/12/2022 08:35 AM   Modules accepted: Orders

## 2022-04-12 NOTE — Progress Notes (Signed)
ROB: Had anatomy scan today.  Feels fetal movement.  Anatomy scan shows bilateral fetal hydronephrosis.  Plan follow-up at 26-week visit.  Discussed with patient, all questions answered.

## 2022-04-12 NOTE — Progress Notes (Signed)
ROB. Patient states fetal movement with occasional pressure. She states she has been having migraines a lot lately and has been using tylenol but would like something additional. Anatomy ultrasound preformed today. Patient states no questions or concerns at this time.

## 2022-04-16 LAB — QUANTIFERON-TB GOLD PLUS
QuantiFERON Mitogen Value: >10 IU/mL
QuantiFERON Nil Value: 0.06 IU/mL
QuantiFERON TB1 Ag Value: 0.02 IU/mL
QuantiFERON TB2 Ag Value: 0.02 IU/mL
QuantiFERON-TB Gold Plus: NEGATIVE

## 2022-04-17 ENCOUNTER — Encounter: Payer: Self-pay | Admitting: Obstetrics and Gynecology

## 2022-05-03 ENCOUNTER — Ambulatory Visit (INDEPENDENT_AMBULATORY_CARE_PROVIDER_SITE_OTHER): Payer: Medicaid Other | Admitting: Obstetrics and Gynecology

## 2022-05-03 ENCOUNTER — Encounter: Payer: Self-pay | Admitting: Obstetrics and Gynecology

## 2022-05-03 VITALS — BP 105/66 | HR 93 | Wt 207.0 lb

## 2022-05-03 DIAGNOSIS — Z131 Encounter for screening for diabetes mellitus: Secondary | ICD-10-CM

## 2022-05-03 DIAGNOSIS — Z3A23 23 weeks gestation of pregnancy: Secondary | ICD-10-CM

## 2022-05-03 DIAGNOSIS — O26899 Other specified pregnancy related conditions, unspecified trimester: Secondary | ICD-10-CM

## 2022-05-03 DIAGNOSIS — Z3482 Encounter for supervision of other normal pregnancy, second trimester: Secondary | ICD-10-CM

## 2022-05-03 DIAGNOSIS — O2612 Low weight gain in pregnancy, second trimester: Secondary | ICD-10-CM | POA: Insufficient documentation

## 2022-05-03 DIAGNOSIS — Z6791 Unspecified blood type, Rh negative: Secondary | ICD-10-CM

## 2022-05-03 DIAGNOSIS — O09299 Supervision of pregnancy with other poor reproductive or obstetric history, unspecified trimester: Secondary | ICD-10-CM

## 2022-05-03 LAB — POCT URINALYSIS DIPSTICK OB
Bilirubin, UA: NEGATIVE
Glucose, UA: NEGATIVE
Ketones, UA: NEGATIVE
Leukocytes, UA: NEGATIVE
Nitrite, UA: NEGATIVE
Spec Grav, UA: 1.025 (ref 1.010–1.025)
Urobilinogen, UA: 0.2 E.U./dL
pH, UA: 6 (ref 5.0–8.0)

## 2022-05-03 NOTE — Progress Notes (Signed)
ROB: Reports getting sunburned, using aloe for relief.  Due for f/u imaging for fetal hydronephrosis next visit. Poor weight gain noted thus far, advised to increase protein intake. RTC in 4 weeks, for 28 week labs and Rhogam/Tdap.

## 2022-05-23 ENCOUNTER — Ambulatory Visit: Payer: Medicaid Other

## 2022-05-23 ENCOUNTER — Ambulatory Visit (INDEPENDENT_AMBULATORY_CARE_PROVIDER_SITE_OTHER): Payer: Medicaid Other

## 2022-05-23 DIAGNOSIS — O358XX0 Maternal care for other (suspected) fetal abnormality and damage, not applicable or unspecified: Secondary | ICD-10-CM | POA: Diagnosis not present

## 2022-05-23 DIAGNOSIS — Z3A20 20 weeks gestation of pregnancy: Secondary | ICD-10-CM

## 2022-05-24 ENCOUNTER — Other Ambulatory Visit: Payer: Medicaid Other

## 2022-05-24 ENCOUNTER — Ambulatory Visit (INDEPENDENT_AMBULATORY_CARE_PROVIDER_SITE_OTHER): Payer: Medicaid Other | Admitting: Obstetrics and Gynecology

## 2022-05-24 ENCOUNTER — Encounter: Payer: Self-pay | Admitting: Obstetrics and Gynecology

## 2022-05-24 VITALS — BP 108/68 | HR 88 | Wt 210.3 lb

## 2022-05-24 DIAGNOSIS — Z3482 Encounter for supervision of other normal pregnancy, second trimester: Secondary | ICD-10-CM

## 2022-05-24 DIAGNOSIS — Z131 Encounter for screening for diabetes mellitus: Secondary | ICD-10-CM

## 2022-05-24 DIAGNOSIS — O26899 Other specified pregnancy related conditions, unspecified trimester: Secondary | ICD-10-CM

## 2022-05-24 DIAGNOSIS — Z3A26 26 weeks gestation of pregnancy: Secondary | ICD-10-CM

## 2022-05-24 DIAGNOSIS — Z2913 Encounter for prophylactic Rho(D) immune globulin: Secondary | ICD-10-CM

## 2022-05-24 DIAGNOSIS — Z23 Encounter for immunization: Secondary | ICD-10-CM

## 2022-05-24 DIAGNOSIS — O09299 Supervision of pregnancy with other poor reproductive or obstetric history, unspecified trimester: Secondary | ICD-10-CM

## 2022-05-24 LAB — POCT URINALYSIS DIPSTICK OB
Bilirubin, UA: NEGATIVE
Blood, UA: NEGATIVE
Glucose, UA: NEGATIVE
Ketones, UA: NEGATIVE
Leukocytes, UA: NEGATIVE
Nitrite, UA: NEGATIVE
POC,PROTEIN,UA: NEGATIVE
Spec Grav, UA: 1.015 (ref 1.010–1.025)
Urobilinogen, UA: 0.2 E.U./dL
pH, UA: 6 (ref 5.0–8.0)

## 2022-05-24 MED ORDER — RHO D IMMUNE GLOBULIN 1500 UNIT/2ML IJ SOSY
300.0000 ug | PREFILLED_SYRINGE | Freq: Once | INTRAMUSCULAR | Status: AC
  Administered 2022-05-24: 300 ug via INTRAMUSCULAR

## 2022-05-24 NOTE — Progress Notes (Signed)
ROB.Patient states no questions or concerns at this time.  

## 2022-05-24 NOTE — Progress Notes (Signed)
ROB: Doing well.  Reports daily fetal movement.  1 hour GCT today.  Ultrasound yesterday showed a decrease in fetal hydronephrosis bilaterally.  Plan possible follow-up in third trimester.  Have discussed this with the patient I have reassured her regarding the decrease in diameter of the ureters.

## 2022-05-25 ENCOUNTER — Encounter: Payer: Self-pay | Admitting: Obstetrics and Gynecology

## 2022-05-25 LAB — CBC
Hematocrit: 36.6 % (ref 34.0–46.6)
Hemoglobin: 11.4 g/dL (ref 11.1–15.9)
MCH: 23.8 pg — ABNORMAL LOW (ref 26.6–33.0)
MCHC: 31.1 g/dL — ABNORMAL LOW (ref 31.5–35.7)
MCV: 76 fL — ABNORMAL LOW (ref 79–97)
Platelets: 171 10*3/uL (ref 150–450)
RBC: 4.8 x10E6/uL (ref 3.77–5.28)
RDW: 14.4 % (ref 11.7–15.4)
WBC: 5.7 10*3/uL (ref 3.4–10.8)

## 2022-05-25 LAB — ANTIBODY SCREEN: Antibody Screen: NEGATIVE

## 2022-05-25 LAB — RPR: RPR Ser Ql: NONREACTIVE

## 2022-05-25 LAB — GLUCOSE, 1 HOUR GESTATIONAL: Gestational Diabetes Screen: 63 mg/dL — ABNORMAL LOW (ref 70–139)

## 2022-05-26 MED ORDER — FERROUS FUMARATE 150 MG PO TABS
1.0000 | ORAL_TABLET | Freq: Every day | ORAL | 3 refills | Status: DC
Start: 2022-05-26 — End: 2022-06-28

## 2022-06-14 ENCOUNTER — Ambulatory Visit (INDEPENDENT_AMBULATORY_CARE_PROVIDER_SITE_OTHER): Payer: Medicaid Other | Admitting: Obstetrics and Gynecology

## 2022-06-14 ENCOUNTER — Encounter: Payer: Self-pay | Admitting: Obstetrics and Gynecology

## 2022-06-14 VITALS — BP 101/57 | HR 94 | Wt 211.8 lb

## 2022-06-14 DIAGNOSIS — Z3A29 29 weeks gestation of pregnancy: Secondary | ICD-10-CM

## 2022-06-14 DIAGNOSIS — O35EXX0 Maternal care for other (suspected) fetal abnormality and damage, fetal genitourinary anomalies, not applicable or unspecified: Secondary | ICD-10-CM

## 2022-06-14 DIAGNOSIS — Z3009 Encounter for other general counseling and advice on contraception: Secondary | ICD-10-CM | POA: Insufficient documentation

## 2022-06-14 DIAGNOSIS — Z3483 Encounter for supervision of other normal pregnancy, third trimester: Secondary | ICD-10-CM

## 2022-06-14 DIAGNOSIS — Z3480 Encounter for supervision of other normal pregnancy, unspecified trimester: Secondary | ICD-10-CM | POA: Insufficient documentation

## 2022-06-14 LAB — POCT URINALYSIS DIPSTICK OB
Bilirubin, UA: NEGATIVE
Glucose, UA: NEGATIVE
Ketones, UA: NEGATIVE
Nitrite, UA: NEGATIVE
Spec Grav, UA: 1.015 (ref 1.010–1.025)
Urobilinogen, UA: 0.2 E.U./dL
pH, UA: 6.5 (ref 5.0–8.0)

## 2022-06-14 NOTE — Progress Notes (Signed)
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 Also noting some swelling in hands and feet. Normal glucola, borderline low iron. Received Rhogam/Tdap last visit. Discussed contraception, desires BTL, consent form signed today. RTC in 2 weeks. For f/u US for fetal hydronephrosis in 4 weeks. Discussed midwifery service and practice model. Patient will meet and greet further in 3rd trimester.

## 2022-06-14 NOTE — Progress Notes (Signed)
ROB: She is doing well, no new concerns today. ?

## 2022-06-15 ENCOUNTER — Encounter: Payer: Self-pay | Admitting: Obstetrics and Gynecology

## 2022-06-23 ENCOUNTER — Encounter: Payer: Medicaid Other | Admitting: Obstetrics and Gynecology

## 2022-06-28 ENCOUNTER — Other Ambulatory Visit: Payer: Self-pay

## 2022-06-28 ENCOUNTER — Encounter: Payer: Self-pay | Admitting: Obstetrics and Gynecology

## 2022-06-28 ENCOUNTER — Ambulatory Visit (INDEPENDENT_AMBULATORY_CARE_PROVIDER_SITE_OTHER): Payer: Medicaid Other | Admitting: Obstetrics and Gynecology

## 2022-06-28 VITALS — BP 107/68 | HR 80 | Wt 211.6 lb

## 2022-06-28 DIAGNOSIS — Z3483 Encounter for supervision of other normal pregnancy, third trimester: Secondary | ICD-10-CM

## 2022-06-28 DIAGNOSIS — O4290 Premature rupture of membranes, unspecified as to length of time between rupture and onset of labor, unspecified weeks of gestation: Secondary | ICD-10-CM

## 2022-06-28 DIAGNOSIS — R82998 Other abnormal findings in urine: Secondary | ICD-10-CM

## 2022-06-28 DIAGNOSIS — Z3A31 31 weeks gestation of pregnancy: Secondary | ICD-10-CM

## 2022-06-28 LAB — POCT URINALYSIS DIPSTICK OB
Bilirubin, UA: NEGATIVE
Glucose, UA: NEGATIVE
Ketones, UA: NEGATIVE
Nitrite, UA: NEGATIVE
Spec Grav, UA: 1.02 (ref 1.010–1.025)
Urobilinogen, UA: 0.2 E.U./dL
pH, UA: 6.5 (ref 5.0–8.0)

## 2022-06-28 MED ORDER — FERROUS FUMARATE 150 MG PO TABS: 1.0000 | ORAL_TABLET | Freq: Every day | ORAL | 3 refills | Status: DC

## 2022-06-28 NOTE — Progress Notes (Signed)
ROB. Patient states daily fetal movement, normal pressure and increased braxton hicks. She states this morning white squatting down she felt a gush of fluid that was clear in color but has continued to leak this morning. Patient still having complaints of heart burn, using TUMS as needed. Patient states no questions or concerns at this time.

## 2022-06-28 NOTE — Progress Notes (Signed)
ROM: Feels as if she has had leaking from the vagina throughout the day today.  Reports normal fetal movement.  Has noticed an increase in "Braxton Hicks contractions".  Ferning negative nitrazine negative -doubt ROM.  Urine sent for C&S. Breech

## 2022-06-30 LAB — URINE CULTURE

## 2022-07-06 ENCOUNTER — Encounter: Payer: Self-pay | Admitting: Obstetrics

## 2022-07-06 ENCOUNTER — Telehealth: Payer: Self-pay | Admitting: Obstetrics and Gynecology

## 2022-07-06 ENCOUNTER — Observation Stay
Admission: EM | Admit: 2022-07-06 | Discharge: 2022-07-06 | Disposition: A | Discharge status: Home / Self Care | Payer: Medicaid Other | Attending: Obstetrics | Admitting: Obstetrics

## 2022-07-06 ENCOUNTER — Other Ambulatory Visit: Payer: Self-pay

## 2022-07-06 DIAGNOSIS — O26893 Other specified pregnancy related conditions, third trimester: Principal | ICD-10-CM | POA: Insufficient documentation

## 2022-07-06 DIAGNOSIS — O321XX0 Maternal care for breech presentation, not applicable or unspecified: Secondary | ICD-10-CM | POA: Insufficient documentation

## 2022-07-06 DIAGNOSIS — R109 Unspecified abdominal pain: Secondary | ICD-10-CM

## 2022-07-06 DIAGNOSIS — Z3A32 32 weeks gestation of pregnancy: Secondary | ICD-10-CM | POA: Diagnosis not present

## 2022-07-06 MED ORDER — ACETAMINOPHEN 325 MG PO TABS: 650.0000 mg | ORAL_TABLET | ORAL | Status: DC | PRN

## 2022-07-06 NOTE — Progress Notes (Signed)
Patient discharged home, discharge instructions given, patient states understanding. Patient left floor in stable condition, denies any other needs at this time. Patient to keep next scheduled OB appointment 

## 2022-07-06 NOTE — Discharge Instructions (Signed)
Drink plenty of water to stay hydrated. Continue previously prescribed work restrictions/limitations. Keep your next scheduled OB appointment. Call your provider for any other concerns.

## 2022-07-06 NOTE — OB Triage Note (Signed)
LABOR & DELIVERY OB TRIAGE NOTE  SUBJECTIVE  HPI Hannah Ball is a 31 y.o. [id number] at [redacted]w[redacted]d who presents to Labor & Delivery for evaluation of abdominal pain. She reports that she has been feeling abdominal pain for the past few days. She has frequent BH ctx but denies painful ctx, LOF, or vaginal bleeding. She reports that the pain is across the top of her abdomen and on the sides. 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 She denies exposure to illness but reports that she still does have nausea/vomiting triggered by smells or foods fairly frequently.  OB History     Gravida  5   Para  4   Term  4   Preterm  0   AB  0   Living  4      SAB  0   IAB  0   Ectopic  0   Multiple  0   Live Births  4           Scheduled Meds: Continuous Infusions: PRN Meds:.acetaminophen  OBJECTIVE  LMP 11/19/2021 (Approximate)   NST I reviewed the NST and it was reactive.  Baseline: 130 Variability: moderate Accelerations: present Decelerations:none Toco: no contractions Category 1  Cardiac: Lungs: Abdomen: soft, gravid, tender to palpation at umbilicus. No palpable mass. Breech confirmed by [place]  ASSESSMENT Impression  1) Pregnancy at [id number], [redacted]w[redacted]d, Estimated Date of Delivery: 08/26/22 2) Reassuring maternal/fetal status 3) Musculoskeletal pain/diastasis recti 4) Breech presentation   PLAN  1) Discharge home with standard labor/return precautions.  2) Encouraged belly binder or kinesiology tape for abdominal support. Avoid heavy lifting, position changes that increase abdominal pressure. Discussed comfort measures such as heating pad and warm bath. She has received a letter for lifting/activity restrictions from Dr. Valentino Saxon, but her workplace has not been compliant.  3) Reviewed ways to vert breech baby, including Spinning Babies inversion, moxibustion, chiropractic, and acupuncture.  Reassured that baby is likely to turn vertex spontaneously. Explained procedure for ECV if it becomes necessary.   4) Keep scheduled ROB appts.  Hannah Ball, CNM

## 2022-07-06 NOTE — Telephone Encounter (Signed)
Patient is calling with concern that she is having pain. Patient states she 32 weeks and 5 days and at her last appointment she said she would told baby's position was feet down. Patient is wanting to know if she needs to be seen or go to the hospital. Please advise?

## 2022-07-06 NOTE — OB Triage Note (Signed)
Patient arrives today complaining of abdominal pain for the past few days but so severe last night that she was unable to sleep. Patient wonders if it is related to the baby's position. Baby was breech and she felt like he was "stuck" transverse last night. Pt stats she still has off and on N/V this entire pregnancy. Pt states baby is moving but not as much as normal. Pt denies LOF, bleeding, or any other concerns.monitors applied CNM at bedside assessing.

## 2022-07-07 NOTE — Telephone Encounter (Signed)
Sent mychart message to inquire

## 2022-07-17 ENCOUNTER — Encounter: Payer: Self-pay | Admitting: Obstetrics

## 2022-07-17 ENCOUNTER — Ambulatory Visit: Payer: Medicaid Other

## 2022-07-17 ENCOUNTER — Ambulatory Visit (INDEPENDENT_AMBULATORY_CARE_PROVIDER_SITE_OTHER): Payer: Medicaid Other | Admitting: Obstetrics

## 2022-07-17 VITALS — BP 102/67 | HR 91 | Wt 209.6 lb

## 2022-07-17 DIAGNOSIS — Z3A34 34 weeks gestation of pregnancy: Secondary | ICD-10-CM

## 2022-07-17 DIAGNOSIS — Z3483 Encounter for supervision of other normal pregnancy, third trimester: Secondary | ICD-10-CM

## 2022-07-17 LAB — POCT URINALYSIS DIPSTICK OB
Bilirubin, UA: NEGATIVE
Blood, UA: NEGATIVE
Glucose, UA: NEGATIVE
Ketones, UA: NEGATIVE
Leukocytes, UA: NEGATIVE
Nitrite, UA: NEGATIVE
POC,PROTEIN,UA: NEGATIVE
Spec Grav, UA: 1.02 (ref 1.010–1.025)
Urobilinogen, UA: 0.2 E.U./dL
pH, UA: 6.5 (ref 5.0–8.0)

## 2022-07-17 NOTE — Progress Notes (Signed)
ROB at [redacted]w[redacted]d. Baby is active. Hannah Ball has not had any more sharp abdominal pains. She has a sinus infection, and her whole family has a GI bug. Her workplace is not honoring the lifting restrictions she was given. She has noticed increased pelvic pressure and BLE edema. Discussed comfort measures. Baby still feels breech. [place] 07/19/22. Discussed GBS swabs at next visit. RTC in 2 weeks.  Lloyd Huger, CNM

## 2022-07-19 ENCOUNTER — Ambulatory Visit (INDEPENDENT_AMBULATORY_CARE_PROVIDER_SITE_OTHER): Payer: Medicaid Other

## 2022-07-19 ENCOUNTER — Ambulatory Visit
Admission: EM | Admit: 2022-07-19 | Discharge: 2022-07-19 | Disposition: A | Discharge status: Home / Self Care | Payer: Medicaid Other | Attending: Urgent Care | Admitting: Urgent Care

## 2022-07-19 DIAGNOSIS — O35EXX0 Maternal care for other (suspected) fetal abnormality and damage, fetal genitourinary anomalies, not applicable or unspecified: Secondary | ICD-10-CM

## 2022-07-19 DIAGNOSIS — J019 Acute sinusitis, unspecified: Secondary | ICD-10-CM | POA: Diagnosis not present

## 2022-07-19 DIAGNOSIS — B9789 Other viral agents as the cause of diseases classified elsewhere: Secondary | ICD-10-CM

## 2022-07-19 MED ORDER — METHYLPREDNISOLONE 4 MG PO TBPK: ORAL_TABLET | ORAL | 0 refills | Status: DC

## 2022-07-19 NOTE — ED Triage Notes (Signed)
Pt. States that since Friday she has had right and left ear pain.Pt. also c/o of a constant headache.

## 2022-07-19 NOTE — ED Provider Notes (Signed)
[facility]    CSN: [id number] Arrival date & time: 07/19/22  1422      History   Chief Complaint Chief Complaint  Patient presents with   Otalgia    HPI Hannah Ball is a 31 y.o. female.    Otalgia   Patient presents to urgent care with complaint of bilateral ear pain and facial pain and pressure around her eyes.  She says her symptoms started last Friday (5 days).  She has constant pounding headache with symptoms worse at night when she lays down.  She is unable to lay on the left side of her head because of the pain.  Past Medical History:  Diagnosis Date   Migraines    Nausea/vomiting in pregnancy 10/26/2016   Trial of diclegis    Ovarian cyst    Rubella non-immune status, antepartum 08/14/2017   Needs MMR PP   Type A blood, Rh negative     Patient Active Problem List   Diagnosis Date Noted   Labor and delivery, indication for care 07/06/2022   Supervision of other normal pregnancy, antepartum 06/14/2022   Unwanted fertility 06/14/2022   Poor weight gain of pregnancy, second trimester 05/03/2022   IUD (intrauterine device) in place 08/31/2020   COVID-19 12/07/2019   History of macrosomia in infant in prior pregnancy, currently pregnant 09/26/2017   Rh negative, antepartum 10/16/2016   Supervision of normal pregnancy, antepartum 10/10/2016    Past Surgical History:  Procedure Laterality Date   LAPAROSCOPIC OVARIAN CYSTECTOMY     OVARIAN CYST REMOVAL  2008    OB History     Gravida  5   Para  4   Term  4   Preterm  0   AB  0   Living  4      SAB  0   IAB  0   Ectopic  0   Multiple  0   Live Births  4            Home Medications    Prior to Admission medications   Medication Sig Start Date End Date Taking? Authorizing Provider  Doxylamine-Pyridoxine (DICLEGIS) 10-10 MG TBEC Take 2 tablets by mouth at bedtime. If symptoms persist, add one tablet in the morning and one in the afternoon Patient not taking:  Reported on 07/06/2022 12/20/21   Rubie Maid, MD  Ferrous Fumarate 150 MG TABS Take 1 tablet (150 mg total) by mouth daily. 06/28/22   Rubie Maid, MD  ondansetron (ZOFRAN-ODT) 4 MG disintegrating tablet Take 1 tablet (4 mg total) by mouth every 6 (six) hours as needed for nausea. 02/14/22   Rubie Maid, MD  Prenatal Vit-Fe Fumarate-FA (PRENATAL PO) Take by mouth.    [provider]    Family History Family History  Problem Relation Age of Onset   Diabetes Maternal Grandfather    Kidney disease Mother     Social History Social History   Tobacco Use   Smoking status: Never   Smokeless tobacco: Never  Vaping Use   Vaping Use: Former  Substance Use Topics   Alcohol use: Not Currently   Drug use: No     Allergies   Benadryl [diphenhydramine hcl (sleep)], Latex, Neo-bacit-poly-lidocaine, Neosporin wound cleanser [benzalkonium chloride], and Penicillins   Review of Systems Review of Systems  HENT:  Positive for ear pain.      Physical Exam Triage Vital Signs ED Triage Vitals  Enc Vitals Group     BP 07/19/22 1454 124/65  Pulse Rate 07/19/22 1454 74     Resp 07/19/22 1454 16     Temp 07/19/22 1454 97.9 F (36.6 C)     Temp src --      SpO2 07/19/22 1454 98 %     Weight --      Height --      Head Circumference --      Peak Flow --      Pain Score 07/19/22 1455 5     Pain Loc --      Pain Edu? --      Excl. in [place]? --    No data found.  Updated Vital Signs BP 124/65   Pulse 74   Temp 97.9 F (36.6 C)   Resp 16   LMP 11/19/2021 (Approximate)   SpO2 98%   Visual Acuity Right Eye Distance:   Left Eye Distance:   Bilateral Distance:    Right Eye Near:   Left Eye Near:    Bilateral Near:     Physical Exam Vitals reviewed.  Constitutional:      Appearance: Normal appearance.  HENT:     Nose:     Right Sinus: Maxillary sinus tenderness and frontal sinus tenderness present.     Left Sinus: Maxillary sinus tenderness and frontal sinus  tenderness present.  Eyes:     Extraocular Movements: Extraocular movements intact.     Conjunctiva/sclera: Conjunctivae normal.     Pupils: Pupils are equal, round, and reactive to light.  Skin:    General: Skin is warm and dry.  Neurological:     General: No focal deficit present.     Mental Status: She is alert and oriented to person, place, and time.  Psychiatric:        Mood and Affect: Mood normal.        Behavior: Behavior normal.      UC Treatments / Results  Labs (all labs ordered are listed, but only abnormal results are displayed) Labs Reviewed - No data to display  EKG   Radiology No results found.  Procedures Procedures (including critical care time)  Medications Ordered in UC Medications - No data to display  Initial Impression / Assessment and Plan / UC Course  I have reviewed the triage vital signs and the nursing notes.  Pertinent labs & imaging results that were available during my care of the patient were reviewed by me and considered in my medical decision making (see chart for details).   Suspect viral etiology.  Sinuses are acutely tender to palpation.  Recommended nasal steroid spray.  Reviewed use of oral corticosteroid with the patient and discussed risks.  Asked her to stop use as soon as she has relief of symptoms.  Patient acknowledges.   Final Clinical Impressions(s) / UC Diagnoses   Final diagnoses:  None   Discharge Instructions   None    ED Prescriptions   None    PDMP not reviewed this encounter.   Rose Phi, [place] 07/19/22 1544

## 2022-07-19 NOTE — Discharge Instructions (Addendum)
Use Flonase twice a day.  Stop oral prednisone as soon as your symptoms are relieved.  Follow-up here or with your OB provider if your symptoms do not resolve.

## 2022-07-31 ENCOUNTER — Encounter: Payer: Medicaid Other | Admitting: Obstetrics

## 2022-08-02 ENCOUNTER — Ambulatory Visit (INDEPENDENT_AMBULATORY_CARE_PROVIDER_SITE_OTHER): Payer: Medicaid Other | Admitting: Obstetrics and Gynecology

## 2022-08-02 ENCOUNTER — Other Ambulatory Visit (HOSPITAL_COMMUNITY)
Admission: RE | Admit: 2022-08-02 | Discharge: 2022-08-02 | Disposition: A | Discharge status: Home / Self Care | Payer: Medicaid Other | Source: Ambulatory Visit | Attending: Obstetrics and Gynecology | Admitting: Obstetrics and Gynecology

## 2022-08-02 ENCOUNTER — Encounter: Payer: Self-pay | Admitting: Obstetrics and Gynecology

## 2022-08-02 VITALS — BP 96/61 | HR 89 | Wt 215.8 lb

## 2022-08-02 DIAGNOSIS — Z3685 Encounter for antenatal screening for Streptococcus B: Secondary | ICD-10-CM | POA: Diagnosis not present

## 2022-08-02 DIAGNOSIS — Z3A36 36 weeks gestation of pregnancy: Secondary | ICD-10-CM | POA: Insufficient documentation

## 2022-08-02 DIAGNOSIS — Z3483 Encounter for supervision of other normal pregnancy, third trimester: Secondary | ICD-10-CM

## 2022-08-02 DIAGNOSIS — Z113 Encounter for screening for infections with a predominantly sexual mode of transmission: Secondary | ICD-10-CM | POA: Diagnosis present

## 2022-08-02 DIAGNOSIS — O35EXX0 Maternal care for other (suspected) fetal abnormality and damage, fetal genitourinary anomalies, not applicable or unspecified: Secondary | ICD-10-CM | POA: Diagnosis present

## 2022-08-02 LAB — POCT URINALYSIS DIPSTICK OB
Bilirubin, UA: NEGATIVE
Blood, UA: POSITIVE
Glucose, UA: NEGATIVE
Ketones, UA: NEGATIVE
Nitrite, UA: NEGATIVE
Spec Grav, UA: 1.015 (ref 1.010–1.025)
Urobilinogen, UA: 0.2 E.U./dL
pH, UA: 6.5 (ref 5.0–8.0)

## 2022-08-02 NOTE — Progress Notes (Signed)
ROB: She has been having some contractions. She is doing well today, no new concerns. GBS/GC cultures done today.

## 2022-08-02 NOTE — Progress Notes (Signed)
ROB: Doing well, no issues.  Does note some occasional contractions. 3rd trimester cultures done.  Growth scan still noting some mild hydronephrosis, to address postpartum with Pediatrician. Otherwise normal growth. RTC in 1 week.

## 2022-08-02 NOTE — Addendum Note (Signed)
Addended by: Brien Few on: 08/02/2022 02:33 PM   Modules accepted: Orders

## 2022-08-04 LAB — CERVICOVAGINAL ANCILLARY ONLY
Bacterial Vaginitis (gardnerella): NEGATIVE
Candida Glabrata: NEGATIVE
Candida Vaginitis: NEGATIVE
Chlamydia: NEGATIVE
Comment: NEGATIVE
Comment: NEGATIVE
Comment: NEGATIVE
Comment: NEGATIVE
Comment: NEGATIVE
Comment: NORMAL
Neisseria Gonorrhea: NEGATIVE
Trichomonas: NEGATIVE

## 2022-08-04 LAB — STREP GP B NAA+RFLX: Strep Gp B NAA+Rflx: NEGATIVE

## 2022-08-09 ENCOUNTER — Ambulatory Visit (INDEPENDENT_AMBULATORY_CARE_PROVIDER_SITE_OTHER): Payer: Medicaid Other | Admitting: Obstetrics and Gynecology

## 2022-08-09 ENCOUNTER — Encounter: Payer: Self-pay | Admitting: Obstetrics and Gynecology

## 2022-08-09 VITALS — BP 102/68 | HR 101 | Wt 216.3 lb

## 2022-08-09 DIAGNOSIS — Z3483 Encounter for supervision of other normal pregnancy, third trimester: Secondary | ICD-10-CM

## 2022-08-09 DIAGNOSIS — Z3A37 37 weeks gestation of pregnancy: Secondary | ICD-10-CM

## 2022-08-09 DIAGNOSIS — Z3480 Encounter for supervision of other normal pregnancy, unspecified trimester: Secondary | ICD-10-CM

## 2022-08-09 LAB — POCT URINALYSIS DIPSTICK OB
Bilirubin, UA: NEGATIVE
Glucose, UA: NEGATIVE
Ketones, UA: NEGATIVE
Leukocytes, UA: NEGATIVE
Nitrite, UA: NEGATIVE
Spec Grav, UA: 1.015 (ref 1.010–1.025)
Urobilinogen, UA: 0.2 E.U./dL
pH, UA: 6.5 (ref 5.0–8.0)

## 2022-08-09 NOTE — Progress Notes (Signed)
ROB: Patient notes difficulty sleeping due to waking up in the middle of the night with [place].  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  Desires to go out of work if possible. Letter given. Discussed labor precautions. RTC in 1 week.

## 2022-08-13 ENCOUNTER — Other Ambulatory Visit: Payer: Self-pay

## 2022-08-13 ENCOUNTER — Observation Stay
Admission: EM | Admit: 2022-08-13 | Discharge: 2022-08-13 | Disposition: A | Discharge status: Home / Self Care | Payer: Medicaid Other | Attending: Obstetrics and Gynecology | Admitting: Obstetrics and Gynecology

## 2022-08-13 ENCOUNTER — Encounter: Payer: Self-pay | Admitting: Obstetrics and Gynecology

## 2022-08-13 DIAGNOSIS — O471 False labor at or after 37 completed weeks of gestation: Principal | ICD-10-CM | POA: Insufficient documentation

## 2022-08-13 DIAGNOSIS — O26893 Other specified pregnancy related conditions, third trimester: Secondary | ICD-10-CM

## 2022-08-13 DIAGNOSIS — Z91040 Latex allergy status: Secondary | ICD-10-CM | POA: Diagnosis not present

## 2022-08-13 DIAGNOSIS — Z3A38 38 weeks gestation of pregnancy: Secondary | ICD-10-CM | POA: Insufficient documentation

## 2022-08-13 DIAGNOSIS — R109 Unspecified abdominal pain: Secondary | ICD-10-CM | POA: Diagnosis not present

## 2022-08-13 DIAGNOSIS — O368330 Maternal care for abnormalities of the fetal heart rate or rhythm, third trimester, not applicable or unspecified: Secondary | ICD-10-CM | POA: Insufficient documentation

## 2022-08-13 DIAGNOSIS — O479 False labor, unspecified: Secondary | ICD-10-CM | POA: Diagnosis present

## 2022-08-13 NOTE — OB Triage Note (Signed)
Pt is a G5P4 at [redacted]w[redacted]d that presents from ED with c/o back pain, Abd pain and ctx. Pt denies VB, LOF and states positive FM. Pt satets pain became worse at 9pm. CNM at bedside

## 2022-08-13 NOTE — Progress Notes (Signed)
Pt removed from EFM with reactive NST. Pt instructed to do position changes with birthing ball and ambulate to see if ctx become more regular. PI hydration encouraged as well.

## 2022-08-13 NOTE — OB Triage Note (Signed)
Pt discharged home. Left floor ambulatory by herself. Hannah Ball

## 2022-08-13 NOTE — Discharge Summary (Signed)
   Please see Final progress Note.  Imagene Riches, CNM  08/13/2022 10:23 AM

## 2022-08-13 NOTE — Final Progress Note (Signed)
Final Progress Note  Patient ID: Hannah Ball MRN: [id number] DOB/AGE: [birth year] 31 y.o.  Admit date: 08/13/2022 Admitting provider: Imagene Riches, CNM Discharge date: 08/13/2022   Admission Diagnoses: irregular uterine contractions  Discharge Diagnoses:  Principal Problem:   Irregular uterine contractions  Latent labor Reactive fetal heart tones  History of Present Illness: The patient is a 31 y.o. female G5P4004 at 10w1dwho presents for a labor check. She has had irregular contractions since last evening. This morning they becams more frequent when she took a shower. She denies any vaginal bleeding , nior leaking of fluid. Her baby is moving well.   Past Medical History:  Diagnosis Date   Migraines    Nausea/vomiting in pregnancy 10/26/2016   Trial of diclegis    Ovarian cyst    Rubella non-immune status, antepartum 08/14/2017   Needs MMR PP   Type A blood, Rh negative     Past Surgical History:  Procedure Laterality Date   LAPAROSCOPIC OVARIAN CYSTECTOMY     OVARIAN CYST REMOVAL  2008    No current facility-administered medications on file prior to encounter.   Current Outpatient Medications on File Prior to Encounter  Medication Sig Dispense Refill   Ferrous Fumarate 150 MG TABS Take 1 tablet (150 mg total) by mouth daily. 90 tablet 3   Prenatal Vit-Fe Fumarate-FA (PRENATAL PO) Take by mouth.      Allergies  Allergen Reactions   Benadryl [Diphenhydramine Hcl (Sleep)] Hives   Latex Hives   Neo-Bacit-Poly-Lidocaine Other (See Comments)    Skin burns   Neosporin Wound Cleanser [Benzalkonium Chloride] Hives   Penicillins Rash    Has patient had a PCN reaction causing immediate rash, facial/tongue/throat swelling, SOB or lightheadedness with hypotension: Yes Has patient had a PCN reaction causing severe rash involving mucus membranes or skin necrosis: No Has patient had a PCN reaction that required hospitalization No Has patient had a PCN  reaction occurring within the last 10 years: No If all of the above answers are "NO", then may proceed with Cephalosporin use.     Social History   Socioeconomic History   Marital status: Married    Spouse name: CDarrick Meigs   Number of children: Not on file   Years of education: Not on file   Highest education level: Not on file  Occupational History   Not on file  Tobacco Use   Smoking status: Never   Smokeless tobacco: Never  Vaping Use   Vaping Use: Former  Substance and Sexual Activity   Alcohol use: Not Currently   Drug use: No   Sexual activity: Yes    Partners: Male    Birth control/protection: Surgical    Comment: BTL  Other Topics Concern   Not on file  Social History Narrative   ** Merged History Encounter **       Social Determinants of Health   Financial Resource Strain: Not on file  Food Insecurity: Not on file  Transportation Needs: Not on file  Physical Activity: Not on file  Stress: Not on file  Social Connections: Not on file  Intimate Partner Violence: Not on file    Family History  Problem Relation Age of Onset   Diabetes Maternal Grandfather    Kidney disease Mother      Review of Systems  Constitutional: Negative.   HENT: Negative.    Eyes: Negative.   Respiratory: Negative.    Cardiovascular: Negative.   Gastrointestinal: Negative.   Genitourinary: Negative.  Musculoskeletal: Negative.   Skin: Negative.   Neurological: Negative.   Endo/Heme/Allergies: Negative.   Psychiatric/Behavioral: Negative.       Physical Exam: BP 106/66   Pulse 88   Temp 97.7 F (36.5 C) (Oral)   Resp 16   Ht _0  (1.702 m)   Wt 98.1 kg   LMP 11/19/2021 (Approximate)   BMI 33.88 kg/m   Physical Exam Constitutional:      Appearance: Normal appearance. She is obese.  Genitourinary:     Vulva and rectum normal.  Cardiovascular:     Rate and Rhythm: Normal rate and regular rhythm.     Pulses: Normal pulses.     Heart sounds: Normal heart  sounds.  Abdominal:     Comments: Gravid abdomen. Palpates soft.  Musculoskeletal:        General: Normal range of motion.     Cervical back: Normal range of motion and neck supple.  Neurological:     General: No focal deficit present.     Mental Status: She is alert and oriented to person, place, and time.  Skin:    General: Skin is warm and dry.  Psychiatric:        Mood and Affect: Mood normal.        Behavior: Behavior normal.     Consults: None  Significant Findings/ Diagnostic Studies: BP 106/66   Pulse 88   Temp 97.7 F (36.5 C) (Oral)   Resp 16   Ht _1  (1.702 m)   Wt 98.1 kg   LMP 11/19/2021 (Approximate)   BMI 33.88 kg/m    Procedures: EFM NST Baseline FHR: 125 beats/min Variability: moderate Accelerations: present Decelerations: absent Tocometry: few contractions seen per toco. Abdomen palpates soft. Only two cotnractions noted in a 30 minute period.  Interpretation:  INDICATIONS: rule out uterine contractions RESULTS:  A NST procedure was performed with FHR monitoring and a normal baseline established, appropriate time of 20-40 minutes of evaluation, and accels >2 seen w 15x15 characteristics.  Results show a REACTIVE NST.    Hospital Course: The patient was placed on the  fetal monitor and a reactive FHT was noted. A cervical exam revealed 3.5 cms dilation , 40% effacement, and the baby was high and ballotable.she wqas encouraged to walk after a reactive tracing was observed, and then rechecked. No change in her cervix was noted, and she was thus discharged home , with plans for f/u at [place]  Discharge Condition: good  Disposition:  There are no questions and answers to display.        Diet: Regular diet  Discharge Activity: Activity as tolerated      Total time spent taking care of this patient: 45 minutes providing direct patient care, reviewing records and placing orders.  Signed: Imagene Riches, CNM  08/13/2022, 10:23  AM

## 2022-08-16 ENCOUNTER — Encounter: Payer: Self-pay | Admitting: Obstetrics and Gynecology

## 2022-08-16 ENCOUNTER — Ambulatory Visit (INDEPENDENT_AMBULATORY_CARE_PROVIDER_SITE_OTHER): Payer: Medicaid Other | Admitting: Obstetrics and Gynecology

## 2022-08-16 ENCOUNTER — Encounter: Payer: Medicaid Other | Admitting: Obstetrics and Gynecology

## 2022-08-16 VITALS — BP 109/65 | HR 94 | Wt 214.0 lb

## 2022-08-16 DIAGNOSIS — O09299 Supervision of pregnancy with other poor reproductive or obstetric history, unspecified trimester: Secondary | ICD-10-CM

## 2022-08-16 DIAGNOSIS — Z3A38 38 weeks gestation of pregnancy: Secondary | ICD-10-CM

## 2022-08-16 DIAGNOSIS — Z3483 Encounter for supervision of other normal pregnancy, third trimester: Secondary | ICD-10-CM

## 2022-08-16 LAB — POCT URINALYSIS DIPSTICK OB
Bilirubin, UA: NEGATIVE
Glucose, UA: NEGATIVE
Ketones, UA: NEGATIVE
Nitrite, UA: NEGATIVE
Spec Grav, UA: 1.015 (ref 1.010–1.025)
Urobilinogen, UA: 0.2 E.U./dL
pH, UA: 7 (ref 5.0–8.0)

## 2022-08-16 NOTE — Progress Notes (Signed)
ROB [redacted]w[redacted]d: She is ready to have baby. She is doing well, has good fetal movement. No new concerns today. Declines flu vaccine today.

## 2022-08-16 NOTE — Progress Notes (Signed)
ROB: Patient reports being seen in triage on this past Sunday due to pain and contractions. Was ruled out for labor. Reviewed labor precautions. Declines flu vaccine. RTC in 1 week.

## 2022-08-23 ENCOUNTER — Other Ambulatory Visit: Payer: Self-pay

## 2022-08-23 ENCOUNTER — Ambulatory Visit (INDEPENDENT_AMBULATORY_CARE_PROVIDER_SITE_OTHER): Payer: Medicaid Other | Admitting: Obstetrics and Gynecology

## 2022-08-23 ENCOUNTER — Inpatient Hospital Stay: Payer: Medicaid Other | Admitting: Anesthesiology

## 2022-08-23 ENCOUNTER — Inpatient Hospital Stay
Admission: EM | Admit: 2022-08-23 | Discharge: 2022-08-25 | DRG: 798 | Disposition: A | Discharge status: Home / Self Care | Payer: Medicaid Other | Attending: Obstetrics and Gynecology | Admitting: Obstetrics and Gynecology

## 2022-08-23 ENCOUNTER — Encounter: Payer: Self-pay | Admitting: Obstetrics and Gynecology

## 2022-08-23 VITALS — BP 98/66 | HR 91 | Wt 213.4 lb

## 2022-08-23 DIAGNOSIS — Z7689 Persons encountering health services in other specified circumstances: Secondary | ICD-10-CM | POA: Diagnosis present

## 2022-08-23 DIAGNOSIS — O360130 Maternal care for anti-D [Rh] antibodies, third trimester, not applicable or unspecified: Secondary | ICD-10-CM | POA: Diagnosis not present

## 2022-08-23 DIAGNOSIS — O09293 Supervision of pregnancy with other poor reproductive or obstetric history, third trimester: Secondary | ICD-10-CM | POA: Diagnosis not present

## 2022-08-23 DIAGNOSIS — Z3483 Encounter for supervision of other normal pregnancy, third trimester: Secondary | ICD-10-CM

## 2022-08-23 DIAGNOSIS — E669 Obesity, unspecified: Secondary | ICD-10-CM | POA: Diagnosis not present

## 2022-08-23 DIAGNOSIS — Z880 Allergy status to penicillin: Secondary | ICD-10-CM | POA: Diagnosis not present

## 2022-08-23 DIAGNOSIS — Z302 Encounter for sterilization: Secondary | ICD-10-CM

## 2022-08-23 DIAGNOSIS — O35EXX0 Maternal care for other (suspected) fetal abnormality and damage, fetal genitourinary anomalies, not applicable or unspecified: Secondary | ICD-10-CM | POA: Diagnosis not present

## 2022-08-23 DIAGNOSIS — O9902 Anemia complicating childbirth: Secondary | ICD-10-CM | POA: Diagnosis not present

## 2022-08-23 DIAGNOSIS — Z6791 Unspecified blood type, Rh negative: Secondary | ICD-10-CM | POA: Diagnosis not present

## 2022-08-23 DIAGNOSIS — Z3A39 39 weeks gestation of pregnancy: Secondary | ICD-10-CM | POA: Diagnosis not present

## 2022-08-23 DIAGNOSIS — O358XX0 Maternal care for other (suspected) fetal abnormality and damage, not applicable or unspecified: Secondary | ICD-10-CM | POA: Diagnosis not present

## 2022-08-23 DIAGNOSIS — Z8616 Personal history of COVID-19: Secondary | ICD-10-CM

## 2022-08-23 DIAGNOSIS — T7840XA Allergy, unspecified, initial encounter: Secondary | ICD-10-CM | POA: Diagnosis not present

## 2022-08-23 DIAGNOSIS — D649 Anemia, unspecified: Secondary | ICD-10-CM | POA: Diagnosis not present

## 2022-08-23 DIAGNOSIS — O26893 Other specified pregnancy related conditions, third trimester: Secondary | ICD-10-CM | POA: Diagnosis not present

## 2022-08-23 DIAGNOSIS — O09299 Supervision of pregnancy with other poor reproductive or obstetric history, unspecified trimester: Secondary | ICD-10-CM

## 2022-08-23 DIAGNOSIS — Z3480 Encounter for supervision of other normal pregnancy, unspecified trimester: Secondary | ICD-10-CM

## 2022-08-23 DIAGNOSIS — O26899 Other specified pregnancy related conditions, unspecified trimester: Secondary | ICD-10-CM

## 2022-08-23 DIAGNOSIS — Z3009 Encounter for other general counseling and advice on contraception: Principal | ICD-10-CM | POA: Diagnosis present

## 2022-08-23 DIAGNOSIS — Z3490 Encounter for supervision of normal pregnancy, unspecified, unspecified trimester: Secondary | ICD-10-CM | POA: Diagnosis present

## 2022-08-23 DIAGNOSIS — Z6833 Body mass index (BMI) 33.0-33.9, adult: Secondary | ICD-10-CM | POA: Diagnosis not present

## 2022-08-23 LAB — CBC
HCT: 33.3 % — ABNORMAL LOW (ref 36.0–46.0)
Hemoglobin: 10.2 g/dL — ABNORMAL LOW (ref 12.0–15.0)
MCH: 21.7 pg — ABNORMAL LOW (ref 26.0–34.0)
MCHC: 30.6 g/dL (ref 30.0–36.0)
MCV: 70.7 fL — ABNORMAL LOW (ref 80.0–100.0)
Platelets: 170 10*3/uL (ref 150–400)
RBC: 4.71 MIL/uL (ref 3.87–5.11)
RDW: 17.4 % — ABNORMAL HIGH (ref 11.5–15.5)
WBC: 8.4 10*3/uL (ref 4.0–10.5)
nRBC: 0 % (ref 0.0–0.2)

## 2022-08-23 LAB — POCT URINALYSIS DIPSTICK OB
Bilirubin, UA: NEGATIVE
Blood, UA: POSITIVE
Glucose, UA: NEGATIVE
Ketones, UA: NEGATIVE
Nitrite, UA: NEGATIVE
Spec Grav, UA: 1.02 (ref 1.010–1.025)
Urobilinogen, UA: 0.2 E.U./dL
pH, UA: 6.5 (ref 5.0–8.0)

## 2022-08-23 LAB — TYPE AND SCREEN
ABO/RH(D): A NEG
Antibody Screen: POSITIVE

## 2022-08-23 MED ORDER — ZOLPIDEM TARTRATE 5 MG PO TABS: 5.0000 mg | ORAL_TABLET | Freq: Every evening | ORAL | Status: DC | PRN

## 2022-08-23 MED ORDER — PHENYLEPHRINE 80 MCG/ML (10ML) SYRINGE FOR IV PUSH (FOR BLOOD PRESSURE SUPPORT): 80.0000 ug | PREFILLED_SYRINGE | INTRAVENOUS | Status: DC | PRN

## 2022-08-23 MED ORDER — FAMOTIDINE 20 MG PO TABS
40.0000 mg | ORAL_TABLET | Freq: Once | ORAL | Status: AC
  Administered 2022-08-24: 40 mg via ORAL
  Filled 2022-08-23: qty 2

## 2022-08-23 MED ORDER — LIDOCAINE-EPINEPHRINE (PF) 1.5 %-1:200000 IJ SOLN
INTRAMUSCULAR | Status: DC | PRN
  Administered 2022-08-23: 3 mL via EPIDURAL

## 2022-08-23 MED ORDER — AMMONIA AROMATIC IN INHA
RESPIRATORY_TRACT | Status: AC
  Filled 2022-08-23: qty 10

## 2022-08-23 MED ORDER — SENNOSIDES-DOCUSATE SODIUM 8.6-50 MG PO TABS
2.0000 | ORAL_TABLET | Freq: Every day | ORAL | Status: DC
  Administered 2022-08-24 – 2022-08-25 (×2): 2 via ORAL
  Filled 2022-08-23 (×2): qty 2

## 2022-08-23 MED ORDER — LACTATED RINGERS IV SOLN: INTRAVENOUS | Status: DC

## 2022-08-23 MED ORDER — LIDOCAINE HCL (PF) 1 % IJ SOLN
INTRAMUSCULAR | Status: AC
  Filled 2022-08-23: qty 30

## 2022-08-23 MED ORDER — LACTATED RINGERS IV SOLN: 500.0000 mL | Freq: Once | INTRAVENOUS | Status: DC

## 2022-08-23 MED ORDER — LACTATED RINGERS IV SOLN: 500.0000 mL | INTRAVENOUS | Status: DC | PRN

## 2022-08-23 MED ORDER — EPHEDRINE 5 MG/ML INJ: 10.0000 mg | INTRAVENOUS | Status: DC | PRN

## 2022-08-23 MED ORDER — OXYTOCIN 10 UNIT/ML IJ SOLN
INTRAMUSCULAR | Status: AC
  Filled 2022-08-23: qty 2

## 2022-08-23 MED ORDER — LIDOCAINE HCL (PF) 1 % IJ SOLN: 30.0000 mL | INTRAMUSCULAR | Status: DC | PRN

## 2022-08-23 MED ORDER — OXYCODONE HCL 5 MG PO TABS
5.0000 mg | ORAL_TABLET | ORAL | Status: DC | PRN
  Administered 2022-08-25: 5 mg via ORAL
  Filled 2022-08-23: qty 1

## 2022-08-23 MED ORDER — PRENATAL MULTIVITAMIN CH
1.0000 | ORAL_TABLET | Freq: Every day | ORAL | Status: DC
  Administered 2022-08-24: 1 via ORAL
  Filled 2022-08-23: qty 1

## 2022-08-23 MED ORDER — COCONUT OIL OIL: 1.0000 | TOPICAL_OIL | Status: DC | PRN

## 2022-08-23 MED ORDER — ACETAMINOPHEN 325 MG PO TABS: 650.0000 mg | ORAL_TABLET | ORAL | Status: DC | PRN

## 2022-08-23 MED ORDER — SOD CITRATE-CITRIC ACID 500-334 MG/5ML PO SOLN: 30.0000 mL | ORAL | Status: DC | PRN

## 2022-08-23 MED ORDER — OXYCODONE HCL 5 MG PO TABS: 10.0000 mg | ORAL_TABLET | ORAL | Status: DC | PRN

## 2022-08-23 MED ORDER — FENTANYL-BUPIVACAINE-NACL 0.5-0.125-0.9 MG/250ML-% EP SOLN
EPIDURAL | Status: AC
  Filled 2022-08-23: qty 250

## 2022-08-23 MED ORDER — FENTANYL-BUPIVACAINE-NACL 0.5-0.125-0.9 MG/250ML-% EP SOLN
12.0000 mL/h | EPIDURAL | Status: DC | PRN
  Administered 2022-08-23: 12 mL/h via EPIDURAL

## 2022-08-23 MED ORDER — TERBUTALINE SULFATE 1 MG/ML IJ SOLN: 0.2500 mg | Freq: Once | INTRAMUSCULAR | Status: DC | PRN

## 2022-08-23 MED ORDER — FENTANYL CITRATE (PF) 100 MCG/2ML IJ SOLN: 50.0000 ug | INTRAMUSCULAR | Status: DC | PRN

## 2022-08-23 MED ORDER — DIBUCAINE (PERIANAL) 1 % EX OINT
1.0000 | TOPICAL_OINTMENT | CUTANEOUS | Status: DC | PRN
  Filled 2022-08-23: qty 28

## 2022-08-23 MED ORDER — SIMETHICONE 80 MG PO CHEW: 80.0000 mg | CHEWABLE_TABLET | ORAL | Status: DC | PRN

## 2022-08-23 MED ORDER — OXYTOCIN BOLUS FROM INFUSION
333.0000 mL | Freq: Once | INTRAVENOUS | Status: AC
  Administered 2022-08-23: 333 mL via INTRAVENOUS

## 2022-08-23 MED ORDER — ONDANSETRON HCL 4 MG/2ML IJ SOLN: 4.0000 mg | INTRAMUSCULAR | Status: DC | PRN

## 2022-08-23 MED ORDER — WITCH HAZEL-GLYCERIN EX PADS
1.0000 | MEDICATED_PAD | CUTANEOUS | Status: DC | PRN
  Filled 2022-08-23: qty 100

## 2022-08-23 MED ORDER — OXYCODONE-ACETAMINOPHEN 5-325 MG PO TABS: 2.0000 | ORAL_TABLET | ORAL | Status: DC | PRN

## 2022-08-23 MED ORDER — OXYTOCIN-SODIUM CHLORIDE 30-0.9 UT/500ML-% IV SOLN
2.5000 [IU]/h | INTRAVENOUS | Status: DC
  Filled 2022-08-23: qty 500

## 2022-08-23 MED ORDER — ONDANSETRON HCL 4 MG/2ML IJ SOLN: 4.0000 mg | Freq: Four times a day (QID) | INTRAMUSCULAR | Status: DC | PRN

## 2022-08-23 MED ORDER — OXYTOCIN-SODIUM CHLORIDE 30-0.9 UT/500ML-% IV SOLN
1.0000 m[IU]/min | INTRAVENOUS | Status: DC
  Administered 2022-08-23: 4 m[IU]/min via INTRAVENOUS

## 2022-08-23 MED ORDER — ONDANSETRON HCL 4 MG PO TABS: 4.0000 mg | ORAL_TABLET | ORAL | Status: DC | PRN

## 2022-08-23 MED ORDER — METOCLOPRAMIDE HCL 10 MG PO TABS
10.0000 mg | ORAL_TABLET | Freq: Once | ORAL | Status: AC
  Administered 2022-08-24: 10 mg via ORAL
  Filled 2022-08-23 (×2): qty 1

## 2022-08-23 MED ORDER — BENZOCAINE-MENTHOL 20-0.5 % EX AERO
1.0000 | INHALATION_SPRAY | CUTANEOUS | Status: DC | PRN
  Filled 2022-08-23: qty 56

## 2022-08-23 MED ORDER — MISOPROSTOL 200 MCG PO TABS
ORAL_TABLET | ORAL | Status: AC
  Filled 2022-08-23: qty 2

## 2022-08-23 MED ORDER — LIDOCAINE HCL (PF) 1 % IJ SOLN
INTRAMUSCULAR | Status: DC | PRN
  Administered 2022-08-23: 2 mL via SUBCUTANEOUS

## 2022-08-23 MED ORDER — OXYCODONE-ACETAMINOPHEN 5-325 MG PO TABS: 1.0000 | ORAL_TABLET | ORAL | Status: DC | PRN

## 2022-08-23 MED ORDER — IBUPROFEN 600 MG PO TABS
600.0000 mg | ORAL_TABLET | Freq: Four times a day (QID) | ORAL | Status: DC
  Administered 2022-08-24 – 2022-08-25 (×5): 600 mg via ORAL
  Filled 2022-08-23 (×6): qty 1

## 2022-08-23 NOTE — Progress Notes (Signed)
ROB: She is doing well, feels tired and ready for baby. She has decreased fetal movement.

## 2022-08-23 NOTE — Discharge Summary (Signed)
Postpartum Discharge Summary      Patient Name: Hannah Ball DOB: 01/31/1991 MRN: [id number]  Date of admission: 08/23/2022 Delivery date:08/23/2022  Delivering provider:   Date of discharge: 08/25/2022  Admitting diagnosis: Encounter for elective induction of labor [Z34.90] Intrauterine pregnancy: [redacted]w[redacted]d    Secondary diagnosis:  Principal Problem:   Encounter for elective induction of labor Active Problems:   Rh negative, antepartum   History of macrosomia in infant in prior pregnancy, currently pregnant   Unwanted fertility  Additional problems: Fetal hydronephrosis of pregnancy, anemia of pregnancy    Discharge diagnosis: Term Pregnancy Delivered and Anemia                                              Post partum procedures:rhogam Augmentation: AROM and Pitocin Complications: None  Hospital course: Induction of Labor With Vaginal Delivery   31y.o. yo [id number] admitted to the hospital 08/23/2022 for induction of labor.  Indication for induction: Elective.  Patient had an uncomplicated labor course. Membrane Rupture Time/Date: 8:33 PM ,08/23/2022   Delivery Method:Vaginal, Spontaneous  Episiotomy: None  Lacerations:  None  Details of delivery can be found in separate delivery note.  Patient had an uncomplicated postpartum course. Patient is discharged home 08/25/22.  Newborn Data: Birth date:08/23/2022  Birth time:10:07 PM  Gender:Female  Living status:Living  Apgars:9 ,9  Weight:3640 g   Magnesium Sulfate received: No BMZ received: No Rhophylac:Yes MMR:No T-DaP:Given prenatally Flu: No Transfusion:No  Physical exam  Vitals:   08/24/22 1800 08/24/22 2020 08/25/22 0016 08/25/22 0731  BP: 91/64 91/60 (!) 97/55 (!) 92/50  Pulse: (!) 57 (!) 52 (!) 52 (!) 53  Resp: 18 16 18 18   Temp: 97.9 F (36.6 C) 98.1 F (36.7 C) 97.9 F (36.6 C) 98 F (36.7 C)  TempSrc:  Oral Oral Oral  SpO2: 100% 99% 99% 97%  Weight:      Height:        General: alert and no distress Lochia: appropriate Uterine Fundus: firm Incision: Healing well with no significant drainage, No significant erythema DVT Evaluation: No evidence of DVT seen on physical exam. Negative Homan's sign. No cords or calf tenderness. No significant calf/ankle edema.   Labs: Lab Results  Component Value Date   WBC 8.4 08/23/2022   HGB 10.2 (L) 08/23/2022   HCT 33.3 (L) 08/23/2022   MCV 70.7 (L) 08/23/2022   PLT 170 08/23/2022    Edinburgh Score:    08/03/2020    1:50 PM  Edinburgh Postnatal Depression Scale Screening Tool  I have been able to laugh and see the funny side of things. 0  I have looked forward with enjoyment to things. 0  I have blamed myself unnecessarily when things went wrong. 0  I have been anxious or worried for no good reason. 0  I have felt scared or panicky for no good reason. 0  Things have been getting on top of me. 0  I have been so unhappy that I have had difficulty sleeping. 0  I have felt sad or miserable. 0  I have been so unhappy that I have been crying. 0  The thought of harming myself has occurred to me. 0  Edinburgh Postnatal Depression Scale Total 0      After visit meds:  Allergies as of 08/25/2022  Reactions   Benadryl [diphenhydramine Hcl (sleep)] Hives   Latex Hives   Neo-bacit-poly-lidocaine Other (See Comments)   Skin burns   Neosporin Wound Cleanser [benzalkonium Chloride] Hives   Penicillins Rash   Has patient had a PCN reaction causing immediate rash, facial/tongue/throat swelling, SOB or lightheadedness with hypotension: Yes Has patient had a PCN reaction causing severe rash involving mucus membranes or skin necrosis: No Has patient had a PCN reaction that required hospitalization No Has patient had a PCN reaction occurring within the last 10 years: No If all of the above answers are "NO", then may proceed with Cephalosporin use.        Medication List     TAKE these medications     docusate sodium 100 MG capsule Commonly known as: COLACE Take 1 capsule (100 mg total) by mouth 2 (two) times daily as needed.   Ferrous Fumarate 150 MG Tabs Take 1 tablet (150 mg total) by mouth daily.   ibuprofen 800 MG tablet Commonly known as: ADVIL Take 1 tablet (800 mg total) by mouth every 8 (eight) hours as needed.   oxyCODONE 5 MG immediate release tablet Commonly known as: Oxy IR/ROXICODONE Take 1 tablet (5 mg total) by mouth every 4 (four) hours as needed for moderate pain or severe pain.   PRENATAL PO Take by mouth.         Discharge home in stable condition Infant Feeding: Breast Infant Disposition:home with mother Discharge instruction: per After Visit Summary and Postpartum booklet. Activity: Advance as tolerated. Pelvic rest for 6 weeks.  Diet: routine diet Anticipated Birth Control: BTL done PP Postpartum Appointment:6 weeks Additional Postpartum F/U:  None Future Appointments: Future Appointments  Date Time Provider [place]  08/30/2022  8:45 AM Rubie Maid, MD [facility] None   Follow up Visit:      08/23/2022 Rubie Maid, MD

## 2022-08-23 NOTE — H&P (Signed)
Obstetric History and Physical  MARVIA TROOST is a 31 y.o. [id number] with IUP at 59w4dpresenting for admission at term for social induction of labor. Patient states she has been having  occasional contractions, none vaginal bleeding, intact membranes, with active fetal movement.    Prenatal Course Source of Care: [facility] with onset of care at 8 weeks Pregnancy complications or risks: Patient Active Problem List   Diagnosis Date Noted   Encounter for elective induction of labor 08/23/2022   Irregular uterine contractions 08/13/2022   Labor and delivery, indication for care 07/06/2022   Supervision of other normal pregnancy, antepartum 06/14/2022   Unwanted fertility 06/14/2022   Poor weight gain of pregnancy, second trimester 05/03/2022   IUD (intrauterine device) in place 08/31/2020   COVID-19 12/07/2019   History of macrosomia in infant in prior pregnancy, currently pregnant 09/26/2017   Rh negative, antepartum 10/16/2016   Supervision of normal pregnancy, antepartum 10/10/2016   She plans to breastfeed She desires bilateral tubal ligation for postpartum contraception.   Prenatal labs and studies: ABO, Rh: --/--/A NEG (10/25 1856) Antibody: POS (10/25 1856) Rubella: 1.08 (03/20 1118) RPR: Non Reactive (07/26 1004)  HBsAg: Negative (03/20 1118)  HIV: Non Reactive (03/20 1118)  GBS:--/Negative (10/04 1433) 1 hr Glucola  normal Genetic screening normal Anatomy [place]   Past Medical History:  Diagnosis Date   Migraines    Nausea/vomiting in pregnancy 10/26/2016   Trial of diclegis    Ovarian cyst    Rubella non-immune status, antepartum 08/14/2017   Needs MMR PP   Type A blood, Rh negative     Past Surgical History:  Procedure Laterality Date   LAPAROSCOPIC OVARIAN CYSTECTOMY     OVARIAN CYST REMOVAL  2008    OB History  Gravida Para Term Preterm AB Living  _0 0 0 4  SAB IAB Ectopic Multiple Live Births  0 0 0 0 4    # Outcome Date  GA Lbr Len/2nd Weight Sex Delivery Anes PTL Lv  5 Current           4 Term 06/30/20 32w2d7:35 / 00:20 3970 g F Vag-Spont EPI  LIV  3 Term 04/13/18 3952w1d8:05 / 00:05 3790 g F Vag-Spont EPI  LIV  2 Term 05/09/17 40w76w3d49 / 02:13 4880 g M Vag-Vacuum EPI  LIV     Birth Comments: WNL  1 Term 2013   3544 g M Vag-Spont EPI  LIV     Complications: Ovarian cyst during pregnancy    Social History   Socioeconomic History   Marital status: Married    Spouse name: ChriDarrick MeigsNumber of children: Not on file   Years of education: Not on file   Highest education level: Not on file  Occupational History   Not on file  Tobacco Use   Smoking status: Never   Smokeless tobacco: Never  Vaping Use   Vaping Use: Former  Substance and Sexual Activity   Alcohol use: Not Currently   Drug use: No   Sexual activity: Yes    Partners: Male    Birth control/protection: Surgical    Comment: BTL  Other Topics Concern   Not on file  Social History Narrative   ** Merged History Encounter **       Social Determinants of Health   Financial Resource Strain: Not on file  Food Insecurity: No Food Insecurity (08/23/2022)   Hunger Vital Sign    Worried  About Running Out of Food in the Last Year: Never true    Ran Out of Food in the Last Year: Never true  Transportation Needs: No Transportation Needs (08/23/2022)   PRAPARE - Hydrologist (Medical): No    Lack of Transportation (Non-Medical): No  Physical Activity: Not on file  Stress: Not on file  Social Connections: Not on file    Family History  Problem Relation Age of Onset   Diabetes Maternal Grandfather    Kidney disease Mother     Medications Prior to Admission  Medication Sig Dispense Refill Last Dose   Ferrous Fumarate 150 MG TABS Take 1 tablet (150 mg total) by mouth daily. 90 tablet 3 08/22/2022   Prenatal Vit-Fe Fumarate-FA (PRENATAL PO) Take by mouth.   08/22/2022    Allergies  Allergen  Reactions   Benadryl [Diphenhydramine Hcl (Sleep)] Hives   Latex Hives   Neo-Bacit-Poly-Lidocaine Other (See Comments)    Skin burns   Neosporin Wound Cleanser [Benzalkonium Chloride] Hives   Penicillins Rash    Has patient had a PCN reaction causing immediate rash, facial/tongue/throat swelling, SOB or lightheadedness with hypotension: Yes Has patient had a PCN reaction causing severe rash involving mucus membranes or skin necrosis: No Has patient had a PCN reaction that required hospitalization No Has patient had a PCN reaction occurring within the last 10 years: No If all of the above answers are "NO", then may proceed with Cephalosporin use.     Review of Systems: Negative except for what is mentioned in HPI.  Physical Exam: LMP 11/19/2021 (Approximate)  CONSTITUTIONAL: Well-developed, well-nourished female in no acute distress.  HENT:  Normocephalic, atraumatic, External right and left ear normal. Oropharynx is clear and moist EYES: Conjunctivae and EOM are normal. Pupils are equal, round, and reactive to light. No scleral icterus.  NECK: Normal range of motion, supple, no masses SKIN: Skin is warm and dry. No rash noted. Not diaphoretic. No erythema. No pallor. NEUROLOGIC: Alert and oriented to person, place, and time. Normal reflexes, muscle tone coordination. No cranial nerve deficit noted. PSYCHIATRIC: Normal mood and affect. Normal behavior. Normal judgment and thought content. CARDIOVASCULAR: Normal heart rate noted, regular rhythm RESPIRATORY: Effort and breath sounds normal, no problems with respiration noted ABDOMEN: Soft, nontender, nondistended, gravid. MUSCULOSKELETAL: Normal range of motion. No edema and no tenderness. 2+ distal pulses.  Cervical Exam: Dilatation 5 cm   Effacement 50%   Station -2   Presentation: cephalic FHT:  Baseline rate 130 bpm   Variability moderate  Accelerations present   Decelerations none Contractions: Irregular, q 2-3 minutes    Pertinent Labs/Studies:   Results for orders placed or performed during the hospital encounter of 08/23/22 (from the past 24 hour(s))  CBC     Status: Abnormal   Collection Time: 08/23/22  6:56 PM  Result Value Ref Range   WBC 8.4 4.0 - 10.5 K/uL   RBC 4.71 3.87 - 5.11 MIL/uL   Hemoglobin 10.2 (L) 12.0 - 15.0 g/dL   HCT 33.3 (L) 36.0 - 46.0 %   MCV 70.7 (L) 80.0 - 100.0 fL   MCH 21.7 (L) 26.0 - 34.0 pg   MCHC 30.6 30.0 - 36.0 g/dL   RDW 17.4 (H) 11.5 - 15.5 %   Platelets 170 150 - 400 K/uL   nRBC 0.0 0.0 - 0.2 %  Type and screen     Status: None   Collection Time: 08/23/22  6:56 PM  Result Value Ref  Range   ABO/RH(D) A NEG    Antibody Screen POS    Sample Expiration 08/26/2022,2359    Antibody Identification      PASSIVELY ACQUIRED ANTI-D Performed at [facility], [address] [place], [place] [address]     Imaging:  [place] Follow Up Patient Name: DEASIA CHIU DOB: 03/03/1991 MRN: [id number]   ULTRASOUND REPORT  Location: [facility] Date of Service: 07/19/2022   Indications:growth/afi, f/u bilateral fetal pyelectasis Findings:  Nelda [place] intrauterine pregnancy is visualized with FHR at 136 BPM.   Biometrics gives an (U/S) Gestational age of 74w4dand an (U/S) EDD of  08/19/2022; this correlates with the clinically established Estimated Date  of Delivery: 08/26/22.   Fetal presentation is Cephalic.  Placenta: posterior.  AFI: 8.6 cm  Growth percentile is 55th.   AC percentile is 64th. EFW: 2656 grams (5lbs 14oz)  Bilateral fetal pyelectasis persists on today's ultrasound (normal is <753m for >/=28 weeks) RRP= 6.7-7.52m74mRP= 9.7-10.2mm38mImpression: 1. 34w47w4dle Single Intrauterine pregnancy dated by previously  established criteria. 2. Growth is 55th %ile.  AFI is 8.6 cm.  3. Bilateral fetal pyelectasis persists, Left > Right  Recommendations: 1.Clinical correlation with the patient's History and Physical  Exam.  KristEdwena BundeS, RVT   The ultrasound images and findings were reviewed by me and I agree with  the above report.  DavidFinis Bud. 07/23/2022 4:11 PM   Assessment : JessiDAISEE CENTNER 30 y.63 [id number] admitted for social induction of labor. History of fetal macrosomia in a prior pregnancy, Rh negative status.   Plan: Labor: Induction/Augmentation as ordered as per protocol with Pitocin. Analgesia as needed. Epidural as desired.  AROM'd wit scant clear fluid.  FWB: Reassuring fetal heart tracing.  GBS negative Delivery plan: Hopeful for vaginal delivery    CherryRubie [facility]idn[facility]

## 2022-08-23 NOTE — Anesthesia Preprocedure Evaluation (Signed)
Anesthesia Evaluation  Patient identified by MRN, date of birth, ID band Patient awake    Reviewed: Allergy & Precautions, NPO status , Patient's Chart, lab work & pertinent test results  History of Anesthesia Complications Negative for: history of anesthetic complications  Airway Mallampati: II  TM Distance: >3 FB Neck ROM: Full    Dental  (+) Dental Advisory Given   Pulmonary neg pulmonary ROS,    breath sounds clear to auscultation       Cardiovascular negative cardio ROS   Rhythm:Regular Rate:Normal     Neuro/Psych  Headaches,    GI/Hepatic negative GI ROS, Neg liver ROS,   Endo/Other  negative endocrine ROS  Renal/GU negative Renal ROS     Musculoskeletal   Abdominal (+) + obese,   Peds  Hematology plt 181k   Anesthesia Other Findings   Reproductive/Obstetrics (+) Pregnancy                             Anesthesia Physical  Anesthesia Plan  ASA: II  Anesthesia Plan: Epidural   Post-op Pain Management:    Induction:   PONV Risk Score and Plan: 2 and Treatment may vary due to age or medical condition  Airway Management Planned: Natural Airway  Additional Equipment:   Intra-op Plan:   Post-operative Plan:   Informed Consent: I have reviewed the patients History and Physical, chart, labs and discussed the procedure including the risks, benefits and alternatives for the proposed anesthesia with the patient or authorized representative who has indicated his/her understanding and acceptance.     Dental advisory given  Plan Discussed with:   Anesthesia Plan Comments:         Anesthesia Quick Evaluation

## 2022-08-23 NOTE — Anesthesia Procedure Notes (Addendum)
Epidural Patient location during procedure: OB Start time: 08/23/2022 8:49 PM End time: 08/23/2022 9:00 PM  Staffing Anesthesiologist: Iran Ouch, MD Performed: anesthesiologist   Preanesthetic Checklist Completed: patient identified, IV checked, site marked, risks and benefits discussed, surgical consent, monitors and equipment checked, pre-op evaluation and timeout performed  Epidural Patient position: sitting Prep: ChloraPrep Patient monitoring: heart rate, continuous pulse ox and blood pressure Approach: midline Location: L3-L4 Injection technique: LOR saline  Needle:  Needle type: Tuohy  Needle gauge: 18 G Needle length: 9 cm Needle insertion depth: 7 cm Catheter type: closed end Catheter size: 20 Guage Catheter at skin depth: 11 cm Test dose: negative and 1.5% lidocaine with Epi 1:200 K  Assessment Events: blood not aspirated, injection not painful, no injection resistance and no paresthesia  Additional Notes Reason for block:procedure for pain

## 2022-08-23 NOTE — Progress Notes (Signed)
ROB: Patient presents today with no major OB complaints.  Notes that she is having some back pain.  Request if possible and induction scheduled in the next 1 to 2 days as she her husband has just learned that he will have to be away out of town this weekend for his job for mandatory training.  I discussed elective induction, advised that due to social reasons would try to obligate patient.  Will see if labor and delivery can accommodate within the next day or 2.

## 2022-08-24 ENCOUNTER — Inpatient Hospital Stay: Payer: Medicaid Other

## 2022-08-24 ENCOUNTER — Encounter: Payer: Self-pay | Admitting: Obstetrics and Gynecology

## 2022-08-24 ENCOUNTER — Encounter
Admission: EM | Disposition: A | Discharge status: Home / Self Care | Payer: Self-pay | Source: Home / Self Care | Attending: Obstetrics and Gynecology

## 2022-08-24 ENCOUNTER — Other Ambulatory Visit: Payer: Self-pay

## 2022-08-24 DIAGNOSIS — Z302 Encounter for sterilization: Secondary | ICD-10-CM

## 2022-08-24 HISTORY — PX: TUBAL LIGATION: SHX77

## 2022-08-24 LAB — CBC
HCT: 33.2 % — ABNORMAL LOW (ref 36.0–46.0)
Hemoglobin: 10.1 g/dL — ABNORMAL LOW (ref 12.0–15.0)
MCH: 21.7 pg — ABNORMAL LOW (ref 26.0–34.0)
MCHC: 30.4 g/dL (ref 30.0–36.0)
MCV: 71.2 fL — ABNORMAL LOW (ref 80.0–100.0)
Platelets: 144 10*3/uL — ABNORMAL LOW (ref 150–400)
RBC: 4.66 MIL/uL (ref 3.87–5.11)
RDW: 17.6 % — ABNORMAL HIGH (ref 11.5–15.5)
WBC: 8.3 10*3/uL (ref 4.0–10.5)
nRBC: 0 % (ref 0.0–0.2)

## 2022-08-24 LAB — RPR: RPR Ser Ql: NONREACTIVE

## 2022-08-24 SURGERY — LIGATION, FALLOPIAN TUBE, POSTPARTUM
Anesthesia: General | Site: Abdomen

## 2022-08-24 MED ORDER — SEVOFLURANE IN SOLN
RESPIRATORY_TRACT | Status: AC
  Filled 2022-08-24: qty 250

## 2022-08-24 MED ORDER — MIDAZOLAM HCL 2 MG/2ML IJ SOLN
INTRAMUSCULAR | Status: DC | PRN
  Administered 2022-08-24: 2 mg via INTRAVENOUS

## 2022-08-24 MED ORDER — OXYCODONE HCL 5 MG/5ML PO SOLN: 5.0000 mg | Freq: Once | ORAL | Status: AC | PRN

## 2022-08-24 MED ORDER — OXYCODONE HCL 5 MG PO TABS
ORAL_TABLET | ORAL | Status: AC
  Filled 2022-08-24: qty 1

## 2022-08-24 MED ORDER — DEXAMETHASONE SODIUM PHOSPHATE 10 MG/ML IJ SOLN
INTRAMUSCULAR | Status: AC
  Filled 2022-08-24: qty 1

## 2022-08-24 MED ORDER — PHENYLEPHRINE HCL (PRESSORS) 10 MG/ML IV SOLN
INTRAVENOUS | Status: DC | PRN
  Administered 2022-08-24: 80 ug via INTRAVENOUS
  Administered 2022-08-24: 160 ug via INTRAVENOUS

## 2022-08-24 MED ORDER — PROPOFOL 10 MG/ML IV BOLUS
INTRAVENOUS | Status: DC | PRN
  Administered 2022-08-24: 150 mg via INTRAVENOUS

## 2022-08-24 MED ORDER — MENTHOL 3 MG MT LOZG
1.0000 | LOZENGE | OROMUCOSAL | Status: DC | PRN
  Administered 2022-08-24 – 2022-08-25 (×3): 3 mg via ORAL
  Filled 2022-08-24: qty 9

## 2022-08-24 MED ORDER — OXYCODONE HCL 5 MG PO TABS
5.0000 mg | ORAL_TABLET | Freq: Once | ORAL | Status: AC | PRN
  Administered 2022-08-24: 5 mg via ORAL

## 2022-08-24 MED ORDER — DEXAMETHASONE SODIUM PHOSPHATE 10 MG/ML IJ SOLN
INTRAMUSCULAR | Status: DC | PRN
  Administered 2022-08-24: 10 mg via INTRAVENOUS

## 2022-08-24 MED ORDER — ROCURONIUM BROMIDE 100 MG/10ML IV SOLN
INTRAVENOUS | Status: DC | PRN
  Administered 2022-08-24: 70 mg via INTRAVENOUS

## 2022-08-24 MED ORDER — GLYCOPYRROLATE 0.2 MG/ML IJ SOLN
INTRAMUSCULAR | Status: DC | PRN
  Administered 2022-08-24: .2 mg via INTRAVENOUS

## 2022-08-24 MED ORDER — ONDANSETRON HCL 4 MG/2ML IJ SOLN
INTRAMUSCULAR | Status: AC
  Filled 2022-08-24: qty 2

## 2022-08-24 MED ORDER — SUGAMMADEX SODIUM 200 MG/2ML IV SOLN
INTRAVENOUS | Status: DC | PRN
  Administered 2022-08-24: 200 mg via INTRAVENOUS

## 2022-08-24 MED ORDER — MIDAZOLAM HCL 2 MG/2ML IJ SOLN
INTRAMUSCULAR | Status: AC
  Filled 2022-08-24: qty 2

## 2022-08-24 MED ORDER — HYDROMORPHONE HCL 1 MG/ML IJ SOLN
INTRAMUSCULAR | Status: AC
  Administered 2022-08-24: 0.5 mg via INTRAVENOUS
  Filled 2022-08-24: qty 1

## 2022-08-24 MED ORDER — HYDROMORPHONE HCL 1 MG/ML IJ SOLN
0.2500 mg | INTRAMUSCULAR | Status: DC | PRN
  Administered 2022-08-24: 0.5 mg via INTRAVENOUS

## 2022-08-24 MED ORDER — BUPIVACAINE HCL (PF) 0.5 % IJ SOLN
INTRAMUSCULAR | Status: AC
  Filled 2022-08-24: qty 30

## 2022-08-24 MED ORDER — LIDOCAINE HCL (CARDIAC) PF 100 MG/5ML IV SOSY
PREFILLED_SYRINGE | INTRAVENOUS | Status: DC | PRN
  Administered 2022-08-24: 100 mg via INTRAVENOUS

## 2022-08-24 MED ORDER — 0.9 % SODIUM CHLORIDE (POUR BTL) OPTIME
TOPICAL | Status: DC | PRN
  Administered 2022-08-24: 500 mL

## 2022-08-24 MED ORDER — ONDANSETRON HCL 4 MG/2ML IJ SOLN
INTRAMUSCULAR | Status: DC | PRN
  Administered 2022-08-24: 4 mg via INTRAVENOUS

## 2022-08-24 MED ORDER — FENTANYL CITRATE (PF) 100 MCG/2ML IJ SOLN
INTRAMUSCULAR | Status: DC | PRN
  Administered 2022-08-24 (×2): 50 ug via INTRAVENOUS

## 2022-08-24 MED ORDER — SUCCINYLCHOLINE CHLORIDE 200 MG/10ML IV SOSY: PREFILLED_SYRINGE | INTRAVENOUS | Status: DC | PRN

## 2022-08-24 MED ORDER — FENTANYL CITRATE (PF) 100 MCG/2ML IJ SOLN
INTRAMUSCULAR | Status: AC
  Filled 2022-08-24: qty 2

## 2022-08-24 MED ORDER — BUPIVACAINE HCL 0.5 % IJ SOLN
INTRAMUSCULAR | Status: DC | PRN
  Administered 2022-08-24: 10 mL

## 2022-08-24 MED ORDER — PROPOFOL 10 MG/ML IV BOLUS
INTRAVENOUS | Status: AC
  Filled 2022-08-24: qty 20

## 2022-08-24 SURGICAL SUPPLY — 26 items
BLADE SURG SZ11 CARB STEEL (BLADE) ×1 IMPLANT
CHLORAPREP W/TINT 26 (MISCELLANEOUS) ×1 IMPLANT
DERMABOND ADVANCED .7 DNX12 (GAUZE/BANDAGES/DRESSINGS) ×1 IMPLANT
DRAPE LAPAROTOMY 100X77 ABD (DRAPES) ×1 IMPLANT
GAUZE 4X4 16PLY ~~LOC~~+RFID DBL (SPONGE) ×1 IMPLANT
GLOVE BIO SURGEON STRL SZ 6.5 (GLOVE) ×1 IMPLANT
GLOVE SURG UNDER LTX SZ7 (GLOVE) ×1 IMPLANT
GOWN STRL REUS W/ TWL LRG LVL3 (GOWN DISPOSABLE) ×2 IMPLANT
GOWN STRL REUS W/TWL LRG LVL3 (GOWN DISPOSABLE) ×2
KIT TURNOVER CYSTO (KITS) ×1 IMPLANT
MANIFOLD NEPTUNE II (INSTRUMENTS) ×1 IMPLANT
NDL HYPO 25GX1X1/2 BEV (NEEDLE) ×1 IMPLANT
NEEDLE HYPO 25GX1X1/2 BEV (NEEDLE) ×1 IMPLANT
NS IRRIG 500ML POUR BTL (IV SOLUTION) ×1 IMPLANT
PACK BASIN MINOR ARMC (MISCELLANEOUS) ×1 IMPLANT
SUT MNCRL 4-0 (SUTURE) ×1
SUT MNCRL 4-0 27XMFL (SUTURE) ×1
SUT PLAIN GUT 0 (SUTURE) ×2 IMPLANT
SUT VIC AB 0 CT1 36 (SUTURE) IMPLANT
SUT VIC AB 0 SH 27 (SUTURE) IMPLANT
SUT VIC AB 3-0 SH 27 (SUTURE) ×1
SUT VIC AB 3-0 SH 27X BRD (SUTURE) ×1 IMPLANT
SUT VICRYL 0 AB UR-6 (SUTURE) ×2 IMPLANT
SUTURE MNCRL 4-0 27XMF (SUTURE) ×1 IMPLANT
SYR 10ML LL (SYRINGE) ×1 IMPLANT
TRAP FLUID SMOKE EVACUATOR (MISCELLANEOUS) ×1 IMPLANT

## 2022-08-24 NOTE — Anesthesia Procedure Notes (Signed)
Procedure Name: Intubation Date/Time: 08/24/2022 12:00 PM  Performed by: Lily Peer, Natsuko Kelsay, CRNAPre-anesthesia Checklist: Patient identified, Emergency Drugs available, Suction available and Patient being monitored Patient Re-evaluated:Patient Re-evaluated prior to induction Oxygen Delivery Method: Circle system utilized Preoxygenation: Pre-oxygenation with 100% oxygen Induction Type: IV induction and Rapid sequence Laryngoscope Size: McGraph and 3 Grade View: Grade I Tube type: Oral Tube size: 7.0 mm Number of attempts: 1 Airway Equipment and Method: Stylet Placement Confirmation: ETT inserted through vocal cords under direct vision, positive ETCO2 and breath sounds checked- equal and bilateral Secured at: 19 cm Tube secured with: Tape Dental Injury: Teeth and Oropharynx as per pre-operative assessment

## 2022-08-24 NOTE — Anesthesia Postprocedure Evaluation (Signed)
Anesthesia Post Note  Patient: Hannah Ball  Procedure(s) Performed: POST PARTUM TUBAL LIGATION (Abdomen)  Patient location during evaluation: PACU Anesthesia Type: General Level of consciousness: awake and alert Pain management: pain level controlled Vital Signs Assessment: post-procedure vital signs reviewed and stable Respiratory status: spontaneous breathing, nonlabored ventilation, respiratory function stable and patient connected to nasal cannula oxygen Cardiovascular status: blood pressure returned to baseline and stable Postop Assessment: no apparent nausea or vomiting Anesthetic complications: no   No notable events documented.   Last Vitals:  Vitals:   08/24/22 1330 08/24/22 1333  BP:  92/66  Pulse: (!) 57 62  Resp: (!) 24 17  Temp:  (!) 36.3 C  SpO2: 93% 93%    Last Pain:  Vitals:   08/24/22 1333  TempSrc:   PainSc: 4                  Ilene Qua

## 2022-08-24 NOTE — Op Note (Signed)
Procedure(s): POST PARTUM TUBAL LIGATION Procedure Note  Hannah Ball female 31 y.o. 08/24/2022  Indications: The patient is a 31 y.o. [id number] female with undesired fertility, PPD#1 s/p SVD  Pre-operative Diagnosis: Multiparity, undesired fertility, PPD#1 s/p SVD  Post-operative Diagnosis: Same  Surgeon: Rubie Maid, MD  Assistants:  Surgical scrub tech.   Anesthesia: General endotracheal anesthesia  Findings: The uterus was palpable 2 fingerbreadths below the umbilicus.  Normal appearing fallopian tubes bilaterally.  Procedure Details: The patient was seen in the Holding Room. The risks, benefits, complications, treatment options, and expected outcomes were discussed with the patient.  The patient concurred with the proposed plan, giving informed consent.  The site of surgery properly noted/marked. The patient was taken to the Operating Room, identified as Hannah Ball and the procedure verified as Procedure(s) (LRB): POST PARTUM TUBAL LIGATION (N/A).   She was then placed under general anesthesia without difficulty. She was placed in the supine position, and was prepped and draped in a sterile manner.    INDICA  After an adequate timeout was performed, attention was turned to the patient's abdomen local analgesia was administered.  A small transverse skin incision was made under the umbilical fold. The incision was taken down to the layer of fascia using the scalpel, and fascia was incised, and extended using the scalpel. The peritoneum was entered in a sharp fashion.  Attention was then turned to the fallopian tubes, and where the patient's right fallopian tube was identified and grasped with a Babcock clamp.  The tube was then followed out to the vimbria.  The Babcock clamp was then used to grasp the tube approximately 4 cm from the cornual region.  A 3 cm segment of tube was then ligated with a free tie of 0-Chromic using the [place] method and excised.  The  left fallopian tube was then ligated in a similar fashion and excised. The tubal lumens were cauterized bilaterally.  Good hemostasis was noted with bilateral fallopian tubes.  The instruments were then removed from the patient's abdomen and the fascial incision was repaired with 0 Vicryl, and the skin was closed with a 4-0 Monocryl subcuticular stitch.  The incision was injected with 10 ml of 0.5% Sensorcaine. The patient tolerated the procedure well.  Instrument, sponge, and needle counts were correct times three.  The patient was then taken to the recovery room awake and in stable condition.   Estimated Blood Loss:  minimal      Drains: None         Total IV Fluids: 600 ml  Specimens: None         Implants: None         Complications:  None; patient tolerated the procedure well.         Disposition: PACU - hemodynamically stable.         Condition: stable   Rubie Maid, MD [facility]

## 2022-08-24 NOTE — Anesthesia Preprocedure Evaluation (Addendum)
Anesthesia Evaluation  Patient identified by MRN, date of birth, ID band Patient awake    Reviewed: Allergy & Precautions, NPO status , Patient's Chart, lab work & pertinent test results  History of Anesthesia Complications Negative for: history of anesthetic complications  Airway Mallampati: II  TM Distance: >3 FB Neck ROM: Full    Dental  (+) Dental Advisory Given   Pulmonary neg pulmonary ROS,    Pulmonary exam normal breath sounds clear to auscultation       Cardiovascular Exercise Tolerance: Good negative cardio ROS Normal cardiovascular exam Rhythm:Regular Rate:Normal     Neuro/Psych  Headaches, negative neurological ROS  negative psych ROS   GI/Hepatic negative GI ROS, Neg liver ROS,   Endo/Other  negative endocrine ROS  Renal/GU negative Renal ROS     Musculoskeletal   Abdominal (+) + obese,   Peds  Hematology negative hematology ROS (+) plt 181k   Anesthesia Other Findings Past Medical History: 12/07/2019: COVID-19     Comment:  +12/05/19 No date: Migraines 10/26/2016: Nausea/vomiting in pregnancy     Comment:  Trial of diclegis  No date: Ovarian cyst 08/14/2017: Rubella non-immune status, antepartum     Comment:  Needs MMR PP No date: Type A blood, Rh negative  Past Surgical History: No date: LAPAROSCOPIC OVARIAN CYSTECTOMY 2008: OVARIAN CYST REMOVAL     Reproductive/Obstetrics negative OB ROS                             Anesthesia Physical  Anesthesia Plan  ASA: 2  Anesthesia Plan: General   Post-op Pain Management: Toradol IV (intra-op)* and Ofirmev IV (intra-op)*   Induction: Intravenous  PONV Risk Score and Plan: 3 and Treatment may vary due to age or medical condition, Ondansetron and Dexamethasone  Airway Management Planned: Oral ETT  Additional Equipment:   Intra-op Plan:   Post-operative Plan: Extubation in OR  Informed Consent: I have reviewed  the patients History and Physical, chart, labs and discussed the procedure including the risks, benefits and alternatives for the proposed anesthesia with the patient or authorized representative who has indicated his/her understanding and acceptance.     Dental advisory given  Plan Discussed with: Anesthesiologist, CRNA and Surgeon  Anesthesia Plan Comments: (Patient consented for risks of anesthesia including but not limited to:  - adverse reactions to medications - damage to eyes, teeth, lips or other oral mucosa - nerve damage due to positioning  - sore throat or hoarseness - Damage to heart, brain, nerves, lungs, other parts of body or loss of life  Patient voiced understanding.)       Anesthesia Quick Evaluation

## 2022-08-24 NOTE — Plan of Care (Signed)
Pt in bed in low, locked position, with call bell in reach. Baby in bassinet at bedside sleeping. Preparing for BTL to take place today. NPO since MN. Pain well managed. Encouraged pt to keep bladder empty to decrease risk of bleeding. Will continue to monitor.

## 2022-08-24 NOTE — Progress Notes (Signed)
Post Partum Day # 1, s/p SVD  Subjective: no complaints, up ad lib, voiding, and tolerating PO. Denies complaints. Remains NPO for anticipated procedure.   Objective: Temp:  [97.9 F (36.6 C)-98.9 F (37.2 C)] 98.3 F (36.8 C) (10/26 0249) Pulse Rate:  [77-91] 77 (10/26 0249) Resp:  [16-21] 18 (10/26 0249) BP: (98-112)/(59-70) 106/64 (10/26 0249) SpO2:  [98 %-100 %] 100 % (10/26 0249) Weight:  [96.8 kg] 96.8 kg (10/25 1003)  Physical Exam:  General: alert and no distress  Lungs: clear to auscultation bilaterally Breasts: normal appearance, no masses or tenderness Heart: regular rate and rhythm, S1, S2 normal, no murmur, click, rub or gallop Abdomen: soft, non-tender; bowel sounds normal; no masses,  no organomegaly Pelvis: Lochia: appropriate, Uterine Fundus: firm Extremities: DVT Evaluation: No evidence of DVT seen on physical exam. Negative Homan's sign. No cords or calf tenderness. No significant calf/ankle edema.   Recent Labs    08/23/22 1856 08/24/22 0550  HGB 10.2* 10.1*  HCT 33.3* 33.2*    Assessment/Plan: Continue routine postpartum care.  Breastfeeding  Circumcision prior to discharge  Contraception: PP BTL to be performed today.  Plan for discharge tomorrow.   LOS: 1 day   Rubie Maid, MD [facility] 08/24/2022 6:33 AM

## 2022-08-24 NOTE — Plan of Care (Signed)
ka

## 2022-08-24 NOTE — Anesthesia Postprocedure Evaluation (Deleted)
Anesthesia Post Note  Patient: Hannah Ball  Procedure(s) Performed: POST PARTUM TUBAL LIGATION  Patient location during evaluation: Mother Baby Anesthesia Type: Epidural Level of consciousness: awake and alert Pain management: pain level controlled Vital Signs Assessment: post-procedure vital signs reviewed and stable Respiratory status: spontaneous breathing, nonlabored ventilation and respiratory function stable Cardiovascular status: stable Postop Assessment: no headache, no backache and epidural receding Anesthetic complications: no   No notable events documented.   Last Vitals:  Vitals:   08/24/22 0249 08/24/22 0740  BP: 106/64 100/65  Pulse: 77 77  Resp: 18 17  Temp: 36.8 C 36.7 C  SpO2: 100%     Last Pain:  Vitals:   08/24/22 0740  TempSrc: Oral  PainSc: 0-No pain                 Asal Teas B Clarisa Kindred

## 2022-08-24 NOTE — Transfer of Care (Signed)
Immediate Anesthesia Transfer of Care Note  Patient: Hannah Ball  Procedure(s) Performed: POST PARTUM TUBAL LIGATION (Abdomen)  Patient Location: PACU  Anesthesia Type:General  Level of Consciousness: awake and alert   Airway & Oxygen Therapy: Patient Spontanous Breathing and Patient connected to face mask oxygen  Post-op Assessment: Report given to RN and Post -op Vital signs reviewed and stable  Post vital signs: Reviewed and stable  Last Vitals:  Vitals Value Taken Time  BP 95/57 08/24/22 1257  Temp    Pulse 78 08/24/22 1258  Resp 19 08/24/22 1258  SpO2 96 % 08/24/22 1258  Vitals shown include unvalidated device data.  Last Pain:  Vitals:   08/24/22 1001  TempSrc: Oral  PainSc: 0-No pain      Patients Stated Pain Goal: 0 (16/38/45 3646)  Complications: No notable events documented.

## 2022-08-24 NOTE — Progress Notes (Signed)
Pt to OR via bed and orderly. 

## 2022-08-24 NOTE — Lactation Note (Signed)
This note was copied from a baby's chart. Lactation Consultation Note  Patient Name: Hannah Ball YIFOY'D Date: 08/24/2022 Reason for consult: Initial assessment Age:31 hours  Lactation to the room for initial visit. Baby is asleep in bassinet and Parents are eating lunch. Mother reports baby is eating well, this is her 5th baby to breastfeed. Encouraged feeding on demand and with cues. If baby is not cueing encouraged hand expression and skin to skin. Encouraged 8 or more attempts in the first 24 hours and 8 or more good feeds after 24 HOL. Reviewed appropriate diapers for days of life and How to know your baby is getting enough to eat. Reviewed "Understanding Postpartum and Newborn Care" booklet at bedside. [facility] # left on board, encouraged to call for any assistance. Mother has no further questions at this time.     Feeding Mother's Current Feeding Choice: Breast Milk  Interventions Interventions: Breast feeding basics reviewed;Education  Discharge Discharge Education: Engorgement and breast care;Warning signs for feeding baby Pump: Personal;Hands Free  Consult Status Consult Status: PRN    Maxamilian Amadon D Ladana Chavero 08/24/2022, 3:09 PM

## 2022-08-25 ENCOUNTER — Encounter: Payer: Self-pay | Admitting: Obstetrics and Gynecology

## 2022-08-25 LAB — SURGICAL PATHOLOGY

## 2022-08-25 MED ORDER — OXYCODONE HCL 5 MG PO TABS: 5.0000 mg | ORAL_TABLET | ORAL | 0 refills | Status: DC | PRN

## 2022-08-25 MED ORDER — DOCUSATE SODIUM 100 MG PO CAPS: 100.0000 mg | ORAL_CAPSULE | Freq: Two times a day (BID) | ORAL | 2 refills | Status: DC | PRN

## 2022-08-25 MED ORDER — IBUPROFEN 800 MG PO TABS: 800.0000 mg | ORAL_TABLET | Freq: Three times a day (TID) | ORAL | 1 refills | Status: DC | PRN

## 2022-08-25 NOTE — Lactation Note (Addendum)
This note was copied from a baby's chart. Lactation Consultation Note  Patient Name: Hannah Ball [id number] Date: 08/25/2022 Reason for consult: Follow-up assessment;Term Age:31 hours  Maternal Data This is mom's 5th baby, SVD. Mom is an experienced breastfeed mother. Today at follow-up she has some nipple tenderness, nipples are intact. Does the patient have breastfeeding experience prior to this delivery?: Yes  Feeding Mother's Current Feeding Choice: Breast Milk Mother independently breastfeeding in cradle hold, baby latched well. LATCH Score Latch: Grasps breast easily, tongue down, lips flanged, rhythmical sucking.  Audible Swallowing: Spontaneous and intermittent  Type of Nipple: Everted at rest and after stimulation  Comfort (Breast/Nipple): Filling, red/small blisters or bruises, mild/mod discomfort  Hold (Positioning): No assistance needed to correctly position infant at breast.  LATCH Score: 9   Interventions Interventions: Breast feeding basics reviewed;Comfort gels;Education  Discharge Discharge Education: Engorgement and breast care;Warning signs for feeding baby;Other (comment) (Reviewed management of sore nipples.) Pump: Personal  Consult Status Consult Status: Complete  Update provided to care nurse.  Hannah Ball 08/25/2022, 12:26 PM

## 2022-08-25 NOTE — Anesthesia Postprocedure Evaluation (Signed)
Anesthesia Post Note  Patient: Hannah Ball  Procedure(s) Performed: AN AD HOC LABOR EPIDURAL  Patient location during evaluation: Mother Baby Anesthesia Type: Epidural Level of consciousness: awake, oriented and awake and alert Pain management: pain level controlled Vital Signs Assessment: post-procedure vital signs reviewed and stable Respiratory status: spontaneous breathing, respiratory function stable and nonlabored ventilation Cardiovascular status: blood pressure returned to baseline and stable Postop Assessment: no headache and no backache Anesthetic complications: no   No notable events documented.   Last Vitals:  Vitals:   08/25/22 0016 08/25/22 0731  BP: (!) 97/55 (!) 92/50  Pulse: (!) 52 (!) 53  Resp: 18 18  Temp: 36.6 C 36.7 C  SpO2: 99% 97%    Last Pain:  Vitals:   08/25/22 0731  TempSrc: Oral  PainSc:                  Johnna Acosta

## 2022-08-25 NOTE — Progress Notes (Signed)
Patient discharged. Discharge instructions given. Patient verbalizes understanding. Transported by axillary. 

## 2022-08-30 ENCOUNTER — Encounter: Payer: Medicaid Other | Admitting: Obstetrics and Gynecology

## 2022-10-06 NOTE — Patient Instructions (Signed)

## 2022-10-06 NOTE — Progress Notes (Unsigned)
   OBSTETRICS POSTPARTUM CLINIC PROGRESS NOTE  Subjective:     Hannah Ball is a 31 y.o. G22P4004 female who presents for a postpartum visit. She is 7 weeks postpartum following a spontaneous vaginal delivery. I have fully reviewed the prenatal and intrapartum course. The delivery was at [redacted]w[redacted]d gestational weeks.  Anesthesia: epidural. Postpartum course has been well. Baby's course was complicated by RSV, required hospitalization. Notes  he has been getting better. Baby is feeding by both breast and bottle - Daron Offer . Bleeding: patient has resumed menses, with Patient's last menstrual period was 10/09/2022 (exact date).. Bowel function is normal. Bladder function is normal. Patient is sexually active. Contraception method desired is tubal ligation (performed postpartum). Postpartum depression screening: negative.  EDPS score is 0.    The following portions of the patient's history were reviewed and updated as appropriate: allergies, current medications, past family history, past medical history, past social history, past surgical history, and problem list.  Review of Systems Pertinent items noted in HPI and remainder of comprehensive ROS otherwise negative.   Objective:    BP (!) 97/59   Pulse 74   Resp 16   Ht 5\' 7"  (1.702 m)   Wt 203 lb 12.8 oz (92.4 kg)   LMP 10/09/2022 (Exact Date)   Breastfeeding Yes   BMI 31.92 kg/m   General:  alert and no distress   Breasts:  inspection negative, no nipple discharge or bleeding, no masses or nodularity palpable  Lungs: clear to auscultation bilaterally  Heart:  regular rate and rhythm, S1, S2 normal, no murmur, click, rub or gallop  Abdomen: soft, non-tender; bowel sounds normal; no masses,  no organomegaly.  Well healed umbilical incision   Vulva:  normal  Vagina: normal vagina, no discharge, exudate, lesion, or erythema  Cervix:  no cervical motion tenderness and no lesions  Corpus: normal size, contour, position,  consistency, mobility, non-tender  Adnexa:  normal adnexa and no mass, fullness, tenderness  Rectal Exam: Not performed.            Labs:  Lab Results  Component Value Date   HGB 10.1 (L) 08/24/2022     Assessment:   1. Postpartum care following vaginal delivery   2. Cervical cancer screening   3. Encounter for screening for maternal depression   4. S/P tubal ligation      Plan:   1. Contraception: tubal ligation 2. Will check Hgb for h/o postpartum anemia of less than 10.  3. Due for cervical screening, pap smear performed today.  4. Maternal depression screen negative.  5. Follow up in: 6 months for annual exam.     08/26/2022, MD [facility]

## 2022-10-10 ENCOUNTER — Ambulatory Visit (INDEPENDENT_AMBULATORY_CARE_PROVIDER_SITE_OTHER): Payer: Medicaid Other | Admitting: Obstetrics and Gynecology

## 2022-10-10 ENCOUNTER — Encounter: Payer: Self-pay | Admitting: Obstetrics and Gynecology

## 2022-10-10 ENCOUNTER — Other Ambulatory Visit (HOSPITAL_COMMUNITY)
Admission: RE | Admit: 2022-10-10 | Discharge: 2022-10-10 | Disposition: A | Discharge status: Home / Self Care | Payer: Medicaid Other | Source: Ambulatory Visit | Attending: Obstetrics and Gynecology | Admitting: Obstetrics and Gynecology

## 2022-10-10 DIAGNOSIS — Z124 Encounter for screening for malignant neoplasm of cervix: Secondary | ICD-10-CM | POA: Insufficient documentation

## 2022-10-10 DIAGNOSIS — Z1332 Encounter for screening for maternal depression: Secondary | ICD-10-CM | POA: Diagnosis not present

## 2022-10-10 DIAGNOSIS — Z9851 Tubal ligation status: Secondary | ICD-10-CM

## 2022-10-10 MED ORDER — METRONIDAZOLE 500 MG PO TABS: 500.0000 mg | ORAL_TABLET | Freq: Two times a day (BID) | ORAL | 0 refills | Status: DC

## 2022-10-19 LAB — CYTOLOGY - PAP
Comment: NEGATIVE
Comment: NEGATIVE
Comment: NEGATIVE
Diagnosis: NEGATIVE
HPV 16: NEGATIVE
HPV 18 / 45: NEGATIVE
High risk HPV: POSITIVE — AB

## 2022-10-22 ENCOUNTER — Encounter: Payer: Self-pay | Admitting: Obstetrics and Gynecology

## 2023-02-07 ENCOUNTER — Encounter: Payer: Self-pay | Admitting: Obstetrics and Gynecology

## 2023-02-08 MED ORDER — TRANEXAMIC ACID 650 MG PO TABS: 1300.0000 mg | ORAL_TABLET | Freq: Three times a day (TID) | ORAL | 2 refills | Status: DC

## 2023-03-09 ENCOUNTER — Encounter: Payer: Self-pay | Admitting: Obstetrics and Gynecology

## 2023-07-31 ENCOUNTER — Ambulatory Visit
Admission: EM | Admit: 2023-07-31 | Discharge: 2023-07-31 | Disposition: A | Discharge status: Home / Self Care | Payer: Medicaid Other | Attending: Emergency Medicine | Admitting: Emergency Medicine

## 2023-07-31 DIAGNOSIS — R21 Rash and other nonspecific skin eruption: Secondary | ICD-10-CM

## 2023-07-31 MED ORDER — PREDNISONE 10 MG (21) PO TBPK: ORAL_TABLET | Freq: Every day | ORAL | 0 refills | Status: DC

## 2023-07-31 NOTE — Discharge Instructions (Signed)
Take the prednisone as directed.  If you are able to tolerated, take an antihistamine such as Claritin, Allegra, or Zyrtec.    Follow-up with your primary care provider if your symptoms are not improving.

## 2023-07-31 NOTE — ED Provider Notes (Signed)
Renaldo Fiddler    CSN: [id number] Arrival date & time: 07/31/23  1347      History   Chief Complaint Chief Complaint  Patient presents with   Rash    HPI Hannah Ball is a 32 y.o. female.  Patient presents with 2-day history of pruritic rash on her face, neck, chest.  The rash feels like sandpaper and is "burning."  No treatments attempted.  She had her hair done the day before the rash started but states no dye touched the areas affected by the rash.  No new products, medications, foods.  No fever, sore throat, cough, shortness of breath, or other symptoms.  She denies current pregnancy or breastfeeding.     The history is provided by the patient and medical records.    Past Medical History:  Diagnosis Date   COVID-19 12/07/2019   +12/05/19   Migraines    Nausea/vomiting in pregnancy 10/26/2016   Trial of diclegis    Ovarian cyst    Rubella non-immune status, antepartum 08/14/2017   Needs MMR PP   Type A blood, Rh negative     Patient Active Problem List   Diagnosis Date Noted   Encounter for elective induction of labor 08/23/2022   Supervision of other normal pregnancy, antepartum 06/14/2022   Unwanted fertility 06/14/2022   Poor weight gain of pregnancy, second trimester 05/03/2022   History of macrosomia in infant in prior pregnancy, currently pregnant 09/26/2017   Rh negative, antepartum 10/16/2016   Supervision of normal pregnancy, antepartum 10/10/2016    Past Surgical History:  Procedure Laterality Date   LAPAROSCOPIC OVARIAN CYSTECTOMY     OVARIAN CYST REMOVAL  2008   TUBAL LIGATION N/A 08/24/2022   Procedure: POST PARTUM TUBAL LIGATION;  Surgeon: Hildred Laser, MD;  Location: [facility] ORS;  Service: Gynecology;  Laterality: N/A;    OB History     Gravida  5   Para  4   Term  4   Preterm  0   AB  0   Living  4      SAB  0   IAB  0   Ectopic  0   Multiple  0   Live Births  4            Home Medications    Prior  to Admission medications   Medication Sig Start Date End Date Taking? Authorizing Provider  predniSONE (STERAPRED UNI-PAK 21 TAB) 10 MG (21) TBPK tablet Take by mouth daily. As directed 07/31/23  Yes Mickie Bail, NP  docusate sodium (COLACE) 100 MG capsule Take 1 capsule (100 mg total) by mouth 2 (two) times daily as needed. Patient not taking: Reported on 07/31/2023 08/25/22   Hildred Laser, MD  Ferrous Fumarate 150 MG TABS Take 1 tablet (150 mg total) by mouth daily. Patient not taking: Reported on 07/31/2023 06/28/22   Hildred Laser, MD  ibuprofen (ADVIL) 800 MG tablet Take 1 tablet (800 mg total) by mouth every 8 (eight) hours as needed. Patient not taking: Reported on 07/31/2023 08/25/22   Hildred Laser, MD  tranexamic acid (LYSTEDA) 650 MG TABS tablet Take 2 tablets (1,300 mg total) by mouth 3 (three) times daily. Take during menses for a maximum of five days Patient not taking: Reported on 07/31/2023 02/08/23   Hildred Laser, MD    Family History Family History  Problem Relation Age of Onset   Diabetes Maternal Grandfather    Kidney disease Mother     Social  History Social History   Tobacco Use   Smoking status: Never   Smokeless tobacco: Never  Vaping Use   Vaping status: Former  Substance Use Topics   Alcohol use: Not Currently   Drug use: No     Allergies   Benadryl [diphenhydramine hcl (sleep)], Latex, Neo-bacit-poly-lidocaine, Neosporin wound cleanser [benzalkonium chloride], and Penicillins   Review of Systems Review of Systems  Constitutional:  Negative for chills and fever.  HENT:  Negative for sore throat and trouble swallowing.   Respiratory:  Negative for cough and shortness of breath.   Cardiovascular:  Negative for chest pain and palpitations.  Skin:  Positive for rash. Negative for color change.     Physical Exam Triage Vital Signs ED Triage Vitals  Encounter Vitals Group     BP      Systolic BP Percentile      Diastolic BP Percentile      Pulse       Resp      Temp      Temp src      SpO2      Weight      Height      Head Circumference      Peak Flow      Pain Score      Pain Loc      Pain Education      Exclude from Growth Chart    No data found.  Updated Vital Signs BP 102/66   Pulse 84   Temp 98 F (36.7 C)   Resp 17   LMP 07/05/2023 Comment: Tubal Ligation  SpO2 98%   Breastfeeding No   Visual Acuity Right Eye Distance:   Left Eye Distance:   Bilateral Distance:    Right Eye Near:   Left Eye Near:    Bilateral Near:     Physical Exam Constitutional:      General: She is not in acute distress. HENT:     Right Ear: Tympanic membrane normal.     Left Ear: Tympanic membrane normal.     Nose: Nose normal.     Mouth/Throat:     Mouth: Mucous membranes are moist.     Pharynx: Oropharynx is clear.  Cardiovascular:     Rate and Rhythm: Normal rate and regular rhythm.     Heart sounds: Normal heart sounds.  Pulmonary:     Effort: Pulmonary effort is normal. No respiratory distress.     Breath sounds: Normal breath sounds.  Skin:    General: Skin is warm and dry.     Findings: Rash present.     Comments: Flesh-colored papular rash on facial cheeks, external ears, neck.  No open wounds, drainage, erythema.   Neurological:     Mental Status: She is alert.      UC Treatments / Results  Labs (all labs ordered are listed, but only abnormal results are displayed) Labs Reviewed - No data to display  EKG   Radiology No results found.  Procedures Procedures (including critical care time)  Medications Ordered in UC Medications - No data to display  Initial Impression / Assessment and Plan / UC Course  I have reviewed the triage vital signs and the nursing notes.  Pertinent labs & imaging results that were available during my care of the patient were reviewed by me and considered in my medical decision making (see chart for details).    Rash.  Afebrile and vital signs are stable.  No symptoms  other than rash.  Patient is allergic to Benadryl but states she is able to take Zyrtec.  Discussed OTC antihistamine such as Zyrtec, Claritin, Allegra as tolerated.  Also treating with prednisone taper.  Education provided on rash.  Instructed patient to follow up with her PCP if her symptoms are not improving.  She agrees to plan of care.   Final Clinical Impressions(s) / UC Diagnoses   Final diagnoses:  Rash     Discharge Instructions      Take the prednisone as directed.  If you are able to tolerated, take an antihistamine such as Claritin, Allegra, or Zyrtec.    Follow-up with your primary care provider if your symptoms are not improving.      ED Prescriptions     Medication Sig Dispense Auth. Provider   predniSONE (STERAPRED UNI-PAK 21 TAB) 10 MG (21) TBPK tablet Take by mouth daily. As directed 21 tablet Mickie Bail, NP      PDMP not reviewed this encounter.   Mickie Bail, NP 07/31/23 [phone]

## 2023-07-31 NOTE — ED Triage Notes (Signed)
Patient to Urgent Care with complaints of a rash that started three days ago. Rash present to left cheek/ ear/ neck/ chest. Reports it is burning and feels like sandpaper.   Reports having her hair done on Saturday. But denies any other new product usage.  Unable to take benadryl d/t allergy.

## 2023-09-25 NOTE — Progress Notes (Deleted)
    GYNECOLOGY PROGRESS NOTE  Subjective:    Patient ID: Hannah Ball, female    DOB: 23-Feb-1991, 32 y.o.   MRN: [id number]  HPI  Patient is a 32 y.o. G33P4004 female who presents for evaluation for menstrual problems.   {Common ambulatory SmartLinks:19316}  Review of Systems {ros; complete:30496}   Objective:   There were no vitals taken for this visit. There is no height or weight on file to calculate BMI. General appearance: {general exam:16600} Abdomen: {abdominal exam:16834} Pelvic: {pelvic exam:16852::"cervix normal in appearance","external genitalia normal","no adnexal masses or tenderness","no cervical motion tenderness","rectovaginal septum normal","uterus normal size, shape, and consistency","vagina normal without discharge"} Extremities: {extremity exam:5109} Neurologic: {neuro exam:17854}   Assessment:   No diagnosis found.   Plan:   There are no diagnoses linked to this encounter.     Hildred Laser, MD [facility]

## 2023-09-26 ENCOUNTER — Ambulatory Visit: Payer: Medicaid Other | Admitting: Obstetrics and Gynecology

## 2023-09-26 ENCOUNTER — Telehealth: Payer: Self-pay | Admitting: Obstetrics and Gynecology

## 2023-09-26 MED ORDER — NORETHIN ACE-ETH ESTRAD-FE 1.5-30 MG-MCG PO TABS: 1.0000 | ORAL_TABLET | Freq: Every day | ORAL | 3 refills | Status: DC

## 2023-09-26 NOTE — Telephone Encounter (Signed)
Please inform that I have sent a prescription to her pharmacy.  If she desires to not have a period at all, she can take the pills daily and skip the sugar pills (colored pills) and go straight to the next pack. If she desires a monthly cycle, then just take the pills as prescribed daily including the sugar pills. She should start the pills on the Sunday after her next cycle. Use a back up method of contraception for the first 2 weeks.

## 2023-09-26 NOTE — Telephone Encounter (Signed)
Patient called this morning, she had an family emergency this morning and had to cancel her appointment today.  She is unable to get on My Chart, and will need a phone call.  She wants to know if there is something else that you can prescribe for her. The medication that she is currently on [facility]) is no longer working.  She is having a lot of Clotting and going through 2 boxes of Ultra tampons in 5 days.    Please advise

## 2023-09-26 NOTE — Telephone Encounter (Signed)
I called the patient she wants the the prescription to go to [facility] [address] [place], [place] [address] Phone [phone].  This the stand alone on [address].  Hannah Ball

## 2023-09-26 NOTE — Telephone Encounter (Signed)
Please advise that if the Hannah Ball is no longer working then her next option would be hormonal in the form of a birth control. If she would like to be prescribed something, I can send it to her pharmacy (we will need to verify her preferred pharmacy).

## 2023-10-16 NOTE — Progress Notes (Signed)
    GYNECOLOGY PROGRESS NOTE  Subjective:    Patient ID: Hannah Ball, female    DOB: 1991/05/13, 32 y.o.   MRN: [id number]  HPI  Patient is a 32 y.o. G42P4004 female who presents for evaluation for menstrual problems. She has been having heavy cycles that last for 14 days, she has been wearing ultra tampons and overnight pads and changing every hour. She has been going through 2 boxes of the ultra sized feminine products in 5-7 days. Also noting overnight pads.  Nothing that she takes makes her cycle better. She had a tubal ligation and notes that a few months later began noting heavier cycles. Was tried on Lysteda which worked well for several months, but then cycles started getting heavy again. Was then sent a prescription for Junel but she has not started on it. Now having menstrual migraines so does not need it. Is now thinking of a hysterectomy.   The following portions of the patient's history were reviewed and updated as appropriate: allergies, current medications, past family history, past medical history, past social history, past surgical history, and problem list.  Review of Systems Pertinent items noted in HPI and remainder of comprehensive ROS otherwise negative.   Objective:   Blood pressure 101/63, pulse 77, height 5\' 7"  (1.702 m), weight 218 lb 4.8 oz (99 kg).  Body mass index is 34.19 kg/m. General appearance: alert and no distress Abdomen: soft, non-tender; bowel sounds normal; no masses,  no organomegaly Pelvic: deferred Extremities: extremities normal, atraumatic, no cyanosis or edema Neurologic: Grossly normal   Assessment:   1. Menorrhagia with regular cycle      Plan:   Patient desires definitive management with hysterectomy.  I proposed doing a laparoscopic-assisted supracervical hysterectomy (LASH) and prophylactic bilateral salpingectomy.  No indication for oophorectomy.  Patient agrees with this proposed surgery.  The risks of surgery were  discussed in detail with the patient including but not limited to: bleeding which may require transfusion or reoperation; infection which may require antibiotics; injury to bowel, bladder, ureters or other surrounding organs; need for additional procedures including laparotomy or subsequent procedures secondary to abnormal pathology; formation of adhesions; thromboembolic phenomenon; incisional problems and other postoperative/anesthesia complications.  Patient was also advised that she will remain in house for 1-2 nights; and expected recovery time after a hysterectomy is 6-8 weeks.  Patient was told that the likelihood that her condition and symptoms will be treated effectively with this surgical management was very high; the postoperative expectations were also discussed in detail. The patient also understands the alternative treatment options which were discussed in full. All questions were answered.  She was told that she will be contacted by our surgical scheduler regarding the time and date of her surgery; will plan for surgery sometime in early February. Routine preoperative instructions will be given to her by the preoperative nursing team.  Routine postoperative instructions will be reviewed with the patient in detail after surgery.  Bleeding precautions were reviewed. Patient education handouts about the procedure was given to the patient to review at home through her AVS.  Return in 5 weeks for pre-op.   Hildred Laser, MD [facility]

## 2023-10-18 ENCOUNTER — Ambulatory Visit (INDEPENDENT_AMBULATORY_CARE_PROVIDER_SITE_OTHER): Payer: Medicaid Other | Admitting: Obstetrics and Gynecology

## 2023-10-18 VITALS — BP 101/63 | HR 77 | Ht 67.0 in | Wt 218.3 lb

## 2023-10-18 DIAGNOSIS — Z01818 Encounter for other preprocedural examination: Secondary | ICD-10-CM

## 2023-10-18 DIAGNOSIS — N920 Excessive and frequent menstruation with regular cycle: Secondary | ICD-10-CM

## 2023-10-18 MED ORDER — NORETHINDRONE ACETATE 5 MG PO TABS: 5.0000 mg | ORAL_TABLET | Freq: Every day | ORAL | 2 refills | Status: DC

## 2023-10-18 NOTE — Patient Instructions (Signed)
Hysterectomy Information °A hysterectomy is a surgery in which the uterus is removed. The lowest part of the uterus (cervix), which opens into the vagina, may be removed as well. In some cases, the fallopian tubes, the ovaries,  or both the fallopian tubes and the ovaries may also be removed. °This procedure may be done to treat different medical problems. It may also be done to help transgender men feel more masculine. After the procedure, a woman will no longer have menstrual periods and will not be able to become pregnant (sterile). °What are the reasons for a hysterectomy? °There are many reasons why a person might have this procedure. They include: °Persistent, abnormal vaginal bleeding. °Long-term (chronic) pelvic pain or infection. °Endometriosis. This is when the lining of the uterus (endometrium) starts to grow outside the uterus. °Adenomyosis. This is when the endometrium starts to grow in the muscle of the uterus. °Pelvic organ prolapse. This is a condition in which the uterus falls down into the vagina. °Noncancerous growths in the uterus (uterine fibroids) that cause symptoms. °The presence of precancerous cells. °Cervical or uterine cancer. °Sex change. This helps a transgender man complete his female identity. °What are the different types of hysterectomy? °There are three different types of hysterectomy: °Supracervical hysterectomy. In this type, the top part of the uterus is removed, but not the cervix. °Total hysterectomy. In this type, the uterus and cervix are removed. °Radical hysterectomy. In this type, the uterus, the cervix, and the tissue that holds the uterus in place (parametrium) are removed. °What are the different ways a hysterectomy can be performed? °There are many different ways a hysterectomy can be performed, including: °Abdominal hysterectomy. In this type, an incision is made in the abdomen. The uterus is removed through this incision. °Vaginal hysterectomy. In this type, an  incision is made in the vagina. The uterus is removed through this incision. There are no abdominal incisions. °Conventional laparoscopic hysterectomy. In this type, 3 or 4 small incisions are made in the abdomen. A thin, lighted tube with a camera (laparoscope) is inserted into one of the incisions. Other tools are put through the other incisions. The uterus is cut into small pieces. The small pieces are removed through the incisions or the vagina. °Laparoscopically assisted vaginal hysterectomy (LAVH). In this type, 3 or 4 small incisions are made in the abdomen. Part of the surgery is performed laparoscopically and the other part is done vaginally. The uterus is removed through the vagina. °Robot-assisted laparoscopic hysterectomy. In this type, a laparoscope and other tools are inserted into 3 or 4 small incisions in the abdomen. A computer-controlled device is used to give the surgeon a 3D image and to help control the surgical instruments. This allows for more precise movements of surgical instruments. The uterus is cut into small pieces and removed through the incisions or the vagina. °Discuss the options with your health care provider to determine which type is the right one for you. °What are the risks of this surgery? °Generally, this is a safe procedure. However, problems may occur, including: °Bleeding and risk of blood transfusion. Tell your health care provider if you do not want to receive any blood products. °Blood clots in the legs or lung. °Infection. °Damage to nearby structures or organs. °Allergic reactions to medicines. °Having to change to an abdominal hysterectomy after starting a less invasive technique. °What to expect after a hysterectomy °You will be given pain medicine. °You may need to stay in the hospital for 1-2 days   to recover, depending on the type of hysterectomy you had. °You will need to have someone with you for the first 3-5 days after you go home. °You will need to follow up  with your surgeon in 2-4 weeks after surgery to evaluate your progress. °If the ovaries are removed, you will have early menopause symptoms such as hot flashes, night sweats, and insomnia. °If you had a hysterectomy for a problem that was not cancer or a condition that could not lead to cancer, then you no longer need Pap tests. However, even if you no longer need a Pap test, get regular pelvic exams to make sure no other problems are developing. °Questions to ask your health care provider °Is a hysterectomy medically necessary? Do I have other treatment options for my condition? °What are my options for hysterectomy procedure? °What organs and tissues need to be removed? °What are the risks? °What are the benefits? °How long will I need to stay in the hospital after the procedure? °How long will I need to recover at home? °What symptoms can I expect after the procedure? °Summary °A hysterectomy is a surgery in which the uterus is removed. The fallopian tubes, the ovaries, or both may be removed as well. °This procedure may be done to treat different medical problems. It may also be done to complete your female identity during a sex change. °After the procedure, a woman will no longer have a menstrual period and will not be able to become pregnant. °There are three types of hysterectomy. Discuss with your health care provider which options are right for you. °This is a safe procedure, though there are some risks. Risks include infection, bleeding, blood clots, or damage to nearby organs. °This information is not intended to replace advice given to you by your health care provider. Make sure you discuss any questions you have with your health care provider. °Document Revised: 08/15/2019 Document Reviewed: 08/15/2019 °Elsevier Patient Education © 2021 [employer] ° °

## 2023-10-21 ENCOUNTER — Encounter: Payer: Self-pay | Admitting: Obstetrics and Gynecology

## 2023-10-29 ENCOUNTER — Encounter: Payer: Self-pay | Admitting: Obstetrics and Gynecology

## 2023-10-29 DIAGNOSIS — N898 Other specified noninflammatory disorders of vagina: Secondary | ICD-10-CM

## 2023-10-29 MED ORDER — METRONIDAZOLE 500 MG PO TABS: 500.0000 mg | ORAL_TABLET | Freq: Two times a day (BID) | ORAL | 0 refills | Status: DC

## 2023-10-30 ENCOUNTER — Ambulatory Visit: Payer: Medicaid Other | Admitting: Obstetrics and Gynecology

## 2023-11-21 NOTE — Progress Notes (Deleted)
@[facility] @   GYNECOLOGY PREOPERATIVE HISTORY AND PHYSICAL   Subjective:  Hannah Ball is a 33 y.o. [id number] here for surgical management of Menorrhagia with regular cycle .   Indications for procedure include: ***. No significant preoperative concerns.  Proposed surgery: LAPAROSCOPIC SUPRACERVICAL HYSTERECTOMY WITH BILATERAL SALPINGECTOMY     Pertinent Gynecological History: Menses: {menses:16152} Bleeding: {uterine bleeding:32112} Contraception: tubal ligation Last mammogram: Not age appropriate Last pap: abnormal:    Date: 10/10/2022   Past Medical History:  Diagnosis Date   COVID-19 12/07/2019   +12/05/19   Migraines    Nausea/vomiting in pregnancy 10/26/2016   Trial of diclegis    Ovarian cyst    Rubella non-immune status, antepartum 08/14/2017   Needs MMR PP   Type A blood, Rh negative    Past Surgical History:  Procedure Laterality Date   LAPAROSCOPIC OVARIAN CYSTECTOMY     OVARIAN CYST REMOVAL  2008   TUBAL LIGATION N/A 08/24/2022   Procedure: POST PARTUM TUBAL LIGATION;  Surgeon: Hildred Laser, MD;  Location: [facility] ORS;  Service: Gynecology;  Laterality: N/A;   OB History  Gravida Para Term Preterm AB Living  5 4 4  0 0 4  SAB IAB Ectopic Multiple Live Births  0 0 0 0 4    # Outcome Date GA Lbr Len/2nd Weight Sex Type Anes PTL Lv  5 Gravida           4 Term 06/30/20 [redacted]w[redacted]d 27:35 / 00:20 8 lb 12 oz (3.97 kg) F Vag-Spont EPI  LIV  3 Term 04/13/18 [redacted]w[redacted]d 138:05 / 00:05 8 lb 5.7 oz (3.79 kg) F Vag-Spont EPI  LIV  2 Term 05/09/17 [redacted]w[redacted]d 11:49 / 02:13 10 lb 12.1 oz (4.88 kg) M Vag-Vacuum EPI  LIV     Birth Comments: WNL  1 Term 2013   7 lb 13 oz (3.544 kg) M Vag-Spont EPI  LIV     Complications: Ovarian cyst during pregnancy  Patient denies any other pertinent gynecologic issues.  Family History  Problem Relation Age of Onset   Diabetes Maternal Grandfather    Kidney disease Mother    Social History   Socioeconomic History   Marital status: Married     Spouse name: Ephriam Knuckles    Number of children: Not on file   Years of education: Not on file   Highest education level: Not on file  Occupational History   Not on file  Tobacco Use   Smoking status: Never   Smokeless tobacco: Never  Vaping Use   Vaping status: Former  Substance and Sexual Activity   Alcohol use: Not Currently   Drug use: No   Sexual activity: Yes    Partners: Male    Birth control/protection: Surgical    Comment: BTL  Other Topics Concern   Not on file  Social History Narrative   ** Merged History Encounter **       Social Drivers of Health   Financial Resource Strain: Not on file  Food Insecurity: No Food Insecurity (08/23/2022)   Hunger Vital Sign    Worried About Running Out of Food in the Last Year: Never true    Ran Out of Food in the Last Year: Never true  Transportation Needs: No Transportation Needs (08/23/2022)   PRAPARE - Administrator, Civil Service (Medical): No    Lack of Transportation (Non-Medical): No  Physical Activity: Not on file  Stress: Not on file  Social Connections: Not on file  Intimate Partner Violence:  Not At Risk (08/23/2022)   Humiliation, Afraid, Rape, and Kick questionnaire    Fear of Current or Ex-Partner: No    Emotionally Abused: No    Physically Abused: No    Sexually Abused: No   Current Outpatient Medications on File Prior to Visit  Medication Sig Dispense Refill   ibuprofen (ADVIL) 800 MG tablet Take 1 tablet (800 mg total) by mouth every 8 (eight) hours as needed. 60 tablet 1   norethindrone (AYGESTIN) 5 MG tablet Take 1 tablet (5 mg total) by mouth daily. (Patient not taking: Reported on 11/20/2023) 30 tablet 2   No current facility-administered medications on file prior to visit.   Allergies  Allergen Reactions   Benadryl [Diphenhydramine Hcl (Sleep)] Hives   Latex Hives   Neo-Bacit-Poly-Lidocaine Other (See Comments)    Skin burns   Neosporin Wound Cleanser [Benzalkonium Chloride] Hives    Penicillins Rash    Has patient had a PCN reaction causing immediate rash, facial/tongue/throat swelling, SOB or lightheadedness with hypotension: Yes Has patient had a PCN reaction causing severe rash involving mucus membranes or skin necrosis: No Has patient had a PCN reaction that required hospitalization No Has patient had a PCN reaction occurring within the last 10 years: No If all of the above answers are "NO", then may proceed with Cephalosporin use.       Review of Systems Constitutional: No recent fever/chills/sweats Respiratory: No recent cough/bronchitis Cardiovascular: No chest pain Gastrointestinal: No recent nausea/vomiting/diarrhea Genitourinary: No UTI symptoms Hematologic/lymphatic:No history of coagulopathy or recent blood thinner use    Objective:   There were no vitals taken for this visit. CONSTITUTIONAL: Well-developed, well-nourished female in no acute distress.  HENT:  Normocephalic, atraumatic, External right and left ear normal. Oropharynx is clear and moist EYES: Conjunctivae and EOM are normal. Pupils are equal, round, and reactive to light. No scleral icterus.  NECK: Normal range of motion, supple, no masses SKIN: Skin is warm and dry. No rash noted. Not diaphoretic. No erythema. No pallor. NEUROLOGIC: Alert and oriented to person, place, and time. Normal reflexes, muscle tone coordination. No cranial nerve deficit noted. PSYCHIATRIC: Normal mood and affect. Normal behavior. Normal judgment and thought content. CARDIOVASCULAR: Normal heart rate noted, regular rhythm RESPIRATORY: Effort and breath sounds normal, no problems with respiration noted ABDOMEN: Soft, nontender, nondistended. PELVIC: Deferred MUSCULOSKELETAL: Normal range of motion. No edema and no tenderness. 2+ distal pulses.    Labs: No results found for this or any previous visit (from the past 2 weeks).   Imaging Studies: No results found.  Assessment:     ***      Plan:     Counseling: Procedure, risks, reasons, benefits and complications (including injury to bowel, bladder, major blood vessel, ureter, bleeding, possibility of transfusion, infection, or fistula formation) reviewed in detail. Likelihood of success in alleviating the patient's condition was discussed. Routine postoperative instructions will be reviewed with the patient and her family in detail after surgery.  The patient concurred with the proposed plan, giving informed written consent for the surgery.   Preop testing ordered. Instructions reviewed, including NPO after midnight.      Hildred Laser, MD [facility]

## 2023-11-22 ENCOUNTER — Other Ambulatory Visit: Payer: Medicaid Other

## 2023-11-22 ENCOUNTER — Telehealth: Payer: Self-pay | Admitting: Obstetrics and Gynecology

## 2023-11-22 ENCOUNTER — Ambulatory Visit
Admission: RE | Admit: 2023-11-22 | Discharge: 2023-11-22 | Disposition: A | Discharge status: Home / Self Care | Payer: Medicaid Other | Source: Ambulatory Visit | Attending: Obstetrics and Gynecology | Admitting: Obstetrics and Gynecology

## 2023-11-22 ENCOUNTER — Encounter: Payer: Medicaid Other | Admitting: Obstetrics and Gynecology

## 2023-11-22 DIAGNOSIS — N920 Excessive and frequent menstruation with regular cycle: Secondary | ICD-10-CM | POA: Insufficient documentation

## 2023-11-22 DIAGNOSIS — N809 Endometriosis, unspecified: Secondary | ICD-10-CM | POA: Diagnosis not present

## 2023-11-22 DIAGNOSIS — D252 Subserosal leiomyoma of uterus: Secondary | ICD-10-CM | POA: Diagnosis not present

## 2023-11-22 DIAGNOSIS — N888 Other specified noninflammatory disorders of cervix uteri: Secondary | ICD-10-CM | POA: Diagnosis not present

## 2023-11-22 NOTE — Telephone Encounter (Signed)
Reached out to pt to reschedule pre-op appt that was scheduled on 11/22/2023 at 10:55 with Dr. Valentino Saxon.  Was able to reschedule to 11/28/2023 at 3:35 with Dr. Valentino Saxon.

## 2023-11-26 ENCOUNTER — Encounter: Payer: Self-pay | Admitting: Obstetrics and Gynecology

## 2023-11-26 ENCOUNTER — Encounter
Admission: RE | Admit: 2023-11-26 | Discharge: 2023-11-26 | Disposition: A | Discharge status: Home / Self Care | Payer: Medicaid Other | Source: Ambulatory Visit | Attending: Obstetrics and Gynecology | Admitting: Obstetrics and Gynecology

## 2023-11-26 ENCOUNTER — Other Ambulatory Visit: Payer: Self-pay

## 2023-11-26 VITALS — Ht 68.0 in

## 2023-11-26 DIAGNOSIS — Z01818 Encounter for other preprocedural examination: Secondary | ICD-10-CM

## 2023-11-26 DIAGNOSIS — Z3009 Encounter for other general counseling and advice on contraception: Secondary | ICD-10-CM

## 2023-11-26 HISTORY — DX: Malignant (primary) neoplasm, unspecified: C80.1

## 2023-11-26 NOTE — Patient Instructions (Addendum)
Your procedure is scheduled on: Monday 12/03/23 To find out your arrival time, please call [phone] between 1PM - 3PM on:   Friday 11/30/23 Report to the Registration Desk on the 1st floor of the [facility]. Free Valet parking is available.  If your arrival time is 6:00 am, do not arrive before that time as the [facility] entrance doors do not open until 6:00 am.  REMEMBER: Instructions that are not followed completely may result in serious medical risk, up to and including death; or upon the discretion of your surgeon and anesthesiologist your surgery may need to be rescheduled.  Do not eat food after midnight the night before surgery.  No gum chewing or hard candies.  You may however, drink CLEAR liquids up to 2 hours before you are scheduled to arrive for your surgery. Do not drink anything within 2 hours of your scheduled arrival time.  Clear liquids include: - water  - apple juice without pulp - gatorade (not RED colors) - black coffee or tea (Do NOT add milk or creamers to the coffee or tea) Do NOT drink anything that is not on this list.  Type 1 and Type 2 diabetics should only drink water.  One week prior to surgery: Stop Anti-inflammatories (NSAIDS) such as Advil, Aleve, Ibuprofen, Motrin, Naproxen, Naprosyn and Aspirin based products such as Excedrin, Goody's Powder, BC Powder. You may however, continue to take Tylenol if needed for pain up until the day of surgery.  Stop ANY OVER THE COUNTER supplements and vitamins TODAY 11/26/23 until after surgery.  Continue taking all prescribed medications.   TAKE ONLY THESE MEDICATIONS THE MORNING OF SURGERY WITH A SIP OF WATER:  none  No Alcohol for 24 hours before or after surgery.  No Smoking including e-cigarettes for 24 hours before surgery.  No chewable tobacco products for at least 6 hours before surgery.  No nicotine patches on the day of surgery.  Do not use any "recreational" drugs for at least a week  (preferably 2 weeks) before your surgery.  Please be advised that the combination of cocaine and anesthesia may have negative outcomes, up to and including death. If you test positive for cocaine, your surgery will be cancelled.  On the morning of surgery brush your teeth with toothpaste and water, you may rinse your mouth with mouthwash if you wish. Do not swallow any toothpaste or mouthwash.  Use CHG Soap or wipes as directed on instruction sheet.  Do not wear lotions, powders, or perfumes.   Do not shave body hair from the neck down 48 hours before surgery.  Wear clean comfortable clothing (specific to your surgery type) to the hospital.  Do not wear jewelry, make-up, hairpins, clips or nail polish.  For welded (permanent) jewelry: bracelets, anklets, waist bands, etc.  Please have this removed prior to surgery.  If it is not removed, there is a chance that hospital personnel will need to cut it off on the day of surgery. Contact lenses, hearing aids and dentures may not be worn into surgery.  Do not bring valuables to the hospital. [facility] is not responsible for any missing/lost belongings or valuables.   Notify your doctor if there is any change in your medical condition (cold, fever, infection).  If you are being discharged the day of surgery, you will not be allowed to drive home. You will need a responsible individual to drive you home and stay with you for 24 hours after surgery.   If  you are taking public transportation, you will need to have a responsible individual with you.  If you are being admitted to the hospital overnight, leave your suitcase in the car. After surgery it may be brought to your room.  In case of increased patient census, it may be necessary for you, the patient, to continue your postoperative care in the Same Day Surgery department.  After surgery, you can help prevent lung complications by doing breathing exercises.  Take deep breaths and cough  every 1-2 hours. Your doctor may order a device called an Incentive Spirometer to help you take deep breaths. When coughing or sneezing, hold a pillow firmly against your incision with both hands. This is called "splinting." Doing this helps protect your incision. It also decreases belly discomfort.  Surgery Visitation Policy:  Patients undergoing a surgery or procedure may have two family members or support persons with them as long as the person is not COVID-19 positive or experiencing its symptoms.   Inpatient Visitation:    Visiting hours are 7 a.m. to 8 p.m. Up to four visitors are allowed at one time in a patient room. The visitors may rotate out with other people during the day. One designated support person (adult) may remain overnight.  Due to an increase in RSV and influenza rates and associated hospitalizations, children ages 72 and under will not be able to visit patients in [facility]. Masks continue to be strongly recommended.  Please call the Pre-admissions Testing Dept. at [phone] if you have any questions about these instructions.     Preparing for Surgery with CHLORHEXIDINE GLUCONATE (CHG) Soap  Chlorhexidine Gluconate (CHG) Soap  o An antiseptic cleaner that kills germs and bonds with the skin to continue killing germs even after washing  o Used for showering the night before surgery and morning of surgery  Before surgery, you can play an important role by reducing the number of germs on your skin.  CHG (Chlorhexidine gluconate) soap is an antiseptic cleanser which kills germs and bonds with the skin to continue killing germs even after washing.  Please do not use if you have an allergy to CHG or antibacterial soaps. If your skin becomes reddened/irritated stop using the CHG.  1. Shower the NIGHT BEFORE SURGERY and the MORNING OF SURGERY with CHG soap.  2. If you choose to wash your hair, wash your hair first as usual with your normal  shampoo.  3. After shampooing, rinse your hair and body thoroughly to remove the shampoo.  4. Use CHG as you would any other liquid soap. You can apply CHG directly to the skin and wash gently with a scrungie or a clean washcloth.  5. Apply the CHG soap to your body only from the neck down. Do not use on open wounds or open sores. Avoid contact with your eyes, ears, mouth, and genitals (private parts). Wash face and genitals (private parts) with your normal soap.  6. Wash thoroughly, paying special attention to the area where your surgery will be performed.  7. Thoroughly rinse your body with warm water.  8. Do not shower/wash with your normal soap after using and rinsing off the CHG soap.  9. Pat yourself dry with a clean towel.  10. Wear clean pajamas to bed the night before surgery.  12. Place clean sheets on your bed the night of your first shower and do not sleep with pets.  13. Shower again with the CHG soap on the day of  surgery prior to arriving at the hospital.  14. Do not apply any deodorants/lotions/powders.  15. Please wear clean clothes to the hospital.

## 2023-11-28 ENCOUNTER — Encounter: Payer: Self-pay | Admitting: Obstetrics and Gynecology

## 2023-11-28 ENCOUNTER — Encounter
Admission: RE | Admit: 2023-11-28 | Discharge: 2023-11-28 | Disposition: A | Discharge status: Home / Self Care | Payer: Medicaid Other | Source: Ambulatory Visit | Attending: Obstetrics and Gynecology | Admitting: Obstetrics and Gynecology

## 2023-11-28 ENCOUNTER — Ambulatory Visit (INDEPENDENT_AMBULATORY_CARE_PROVIDER_SITE_OTHER): Payer: Medicaid Other | Admitting: Obstetrics and Gynecology

## 2023-11-28 ENCOUNTER — Encounter: Payer: Self-pay | Admitting: Urgent Care

## 2023-11-28 VITALS — BP 97/63 | HR 76 | Ht 67.0 in | Wt 218.4 lb

## 2023-11-28 DIAGNOSIS — Z01818 Encounter for other preprocedural examination: Secondary | ICD-10-CM

## 2023-11-28 DIAGNOSIS — Z9851 Tubal ligation status: Secondary | ICD-10-CM

## 2023-11-28 DIAGNOSIS — Z01812 Encounter for preprocedural laboratory examination: Secondary | ICD-10-CM | POA: Insufficient documentation

## 2023-11-28 DIAGNOSIS — Z3009 Encounter for other general counseling and advice on contraception: Secondary | ICD-10-CM

## 2023-11-28 DIAGNOSIS — D252 Subserosal leiomyoma of uterus: Secondary | ICD-10-CM

## 2023-11-28 DIAGNOSIS — N920 Excessive and frequent menstruation with regular cycle: Secondary | ICD-10-CM

## 2023-11-28 LAB — CBC
HCT: 38.8 % (ref 36.0–46.0)
Hemoglobin: 12.4 g/dL (ref 12.0–15.0)
MCH: 22.8 pg — ABNORMAL LOW (ref 26.0–34.0)
MCHC: 32 g/dL (ref 30.0–36.0)
MCV: 71.5 fL — ABNORMAL LOW (ref 80.0–100.0)
Platelets: 216 10*3/uL (ref 150–400)
RBC: 5.43 MIL/uL — ABNORMAL HIGH (ref 3.87–5.11)
RDW: 15.9 % — ABNORMAL HIGH (ref 11.5–15.5)
WBC: 6.6 10*3/uL (ref 4.0–10.5)
nRBC: 0 % (ref 0.0–0.2)

## 2023-11-28 LAB — TYPE AND SCREEN
ABO/RH(D): A NEG
Antibody Screen: NEGATIVE

## 2023-11-28 NOTE — Patient Instructions (Addendum)
GYNECOLOGY PRE-OPERATIVE INSTRUCTIONS  You are scheduled for surgery on 12/03/2023.  The name of your procedure is: LAPAROSCOPIC SUPRACERVICAL HYSTERECTOMY WITH BILATERAL SALPINGECTOMY (Removal of uterus above the cervix, and fallopian tubes).   Please read through these instructions carefully regarding preparation for your surgery: Nothing to eat after midnight on the day prior to surgery.  Do not take any medications unless recommended by your provider on day prior to surgery.  Do not take NSAIDs (Motrin, Aleve) or aspirin 7 days prior to surgery.  You may take Tylenol products for minor aches and pains.  You will receive a prescription for pain medications post-operatively.  You will need someone to drive you to and from your surgery. You WILL NOT be allowed to drive for 48 hours after your surgery.  Please call the office if you have any questions regarding your upcoming surgery.    Thank you for choosing [facility] at [facility].

## 2023-11-28 NOTE — Progress Notes (Signed)
GYNECOLOGY PREOPERATIVE HISTORY AND PHYSICAL   Subjective:  Hannah Ball is a 33 y.o. [id number] here for surgical management of menorrhagia with regular cycle.  She had a tubal ligation performed in 2023 and notes that a few months later began noting heavier cycles.  Cycles are lasting almost 2 weeks, and requires use of ultra tampons and overnight pads, having to change every 1-2 hours.  Was tried on Lysteda which worked well for several months, but then cycles started getting heavy again. Was then sent a prescription for Junel but did not start on it. Now having menstrual migraines so prefers not to utilize. Prescribed Aygestin which patient also has not utilized. Does report remote history of diagnosis of endometriosis. No significant preoperative concerns.  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)  Date: 10/10/2022   Past Medical History:  Diagnosis Date   Cancer (HCC)    COVID-19 12/07/2019   +12/05/19   Migraines    Nausea/vomiting in pregnancy 10/26/2016   Trial of diclegis    Ovarian cyst    Rubella non-immune status, antepartum 08/14/2017   Needs MMR PP   Type A blood, Rh negative     Past Surgical History:  Procedure Laterality Date   LAPAROSCOPIC OVARIAN CYSTECTOMY     OVARIAN CYST REMOVAL  2008   TUBAL LIGATION N/A 08/24/2022   Procedure: POST PARTUM TUBAL LIGATION;  Surgeon: Hildred Laser, MD;  Location: [facility] ORS;  Service: Gynecology;  Laterality: N/A;    OB History  Gravida Para Term Preterm AB Living  5 4 4  0 0 4  SAB IAB Ectopic Multiple Live Births  0 0 0 0 4    # Outcome Date GA Lbr Len/2nd Weight Sex Type Anes PTL Lv  5  Gravida           4 Term 06/30/20 [redacted]w[redacted]d 27:35 / 00:20 8 lb 12 oz (3.97 kg) F Vag-Spont EPI  LIV  3 Term 04/13/18 [redacted]w[redacted]d 138:05 / 00:05 8 lb 5.7 oz (3.79 kg) F Vag-Spont EPI  LIV  2 Term 05/09/17 [redacted]w[redacted]d 11:49 / 02:13 10 lb 12.1 oz (4.88 kg) M Vag-Vacuum EPI  LIV     Birth Comments: WNL  1 Term 2013   7 lb 13 oz (3.544 kg) M Vag-Spont EPI  LIV     Complications: Ovarian cyst during pregnancy    Family History  Problem Relation Age of Onset   Diabetes Maternal Grandfather    Kidney disease Mother    Social History   Socioeconomic History   Marital status: Married    Spouse name: Ephriam Knuckles    Number of children: Not on file   Years of education: Not on file   Highest education level: Not on file  Occupational History   Not on file  Tobacco Use   Smoking status: Never   Smokeless tobacco: Never  Vaping Use   Vaping status: Former  Substance and Sexual Activity   Alcohol use: Not Currently   Drug use: No   Sexual activity: Yes    Partners: Male    Birth control/protection: Surgical    Comment: BTL  Other Topics Concern   Not on file  Social History Narrative   **  Merged History Encounter **       Social Drivers of Corporate investment banker Strain: Not on file  Food Insecurity: No Food Insecurity (08/23/2022)   Hunger Vital Sign    Worried About Running Out of Food in the Last Year: Never true    Ran Out of Food in the Last Year: Never true  Transportation Needs: No Transportation Needs (08/23/2022)   PRAPARE - Administrator, Civil Service (Medical): No    Lack of Transportation (Non-Medical): No  Physical Activity: Not on file  Stress: Not on file  Social Connections: Not on file  Intimate Partner Violence: Not At Risk (08/23/2022)   Humiliation, Afraid, Rape, and Kick questionnaire    Fear of Current or Ex-Partner: No    Emotionally Abused: No    Physically Abused: No    Sexually Abused: No    Current Outpatient Medications on File Prior to Visit   Medication Sig Dispense Refill   ibuprofen (ADVIL) 800 MG tablet Take 1 tablet (800 mg total) by mouth every 8 (eight) hours as needed. 60 tablet 1   norethindrone (AYGESTIN) 5 MG tablet Take 1 tablet (5 mg total) by mouth daily. (Patient not taking: Reported on 11/20/2023) 30 tablet 2   No current facility-administered medications on file prior to visit.    Allergies  Allergen Reactions   Benadryl [Diphenhydramine Hcl (Sleep)] Hives   Latex Hives   Neo-Bacit-Poly-Lidocaine Other (See Comments)    Skin burns   Neosporin Wound Cleanser [Benzalkonium Chloride] Hives   Penicillins Rash    Has patient had a PCN reaction causing immediate rash, facial/tongue/throat swelling, SOB or lightheadedness with hypotension: Yes Has patient had a PCN reaction causing severe rash involving mucus membranes or skin necrosis: No Has patient had a PCN reaction that required hospitalization No Has patient had a PCN reaction occurring within the last 10 years: No If all of the above answers are "NO", then may proceed with Cephalosporin use.      Review of Systems Constitutional: No recent fever/chills/sweats Respiratory: No recent cough/bronchitis Cardiovascular: No chest pain Gastrointestinal: No recent nausea/vomiting/diarrhea Genitourinary: No UTI symptoms Hematologic/lymphatic:No history of coagulopathy or recent blood thinner use    Objective:   Blood pressure 97/63, pulse 76, height 5\' 7"  (1.702 m), weight 218 lb 6.4 oz (99.1 kg), last menstrual period 11/15/2023.  Body mass index is 34.21 kg/m. CONSTITUTIONAL: Well-developed, well-nourished female in no acute distress.  HENT:  Normocephalic, atraumatic, External right and left ear normal. Oropharynx is clear and moist EYES: Conjunctivae and EOM are normal. Pupils are equal, round, and reactive to light. No scleral icterus.  NECK: Normal range of motion, supple, no masses SKIN: Skin is warm and dry. No rash noted. Not diaphoretic. No  erythema. No pallor. NEUROLOGIC: Alert and oriented to person, place, and time. Normal reflexes, muscle tone coordination. No cranial nerve deficit noted. PSYCHIATRIC: Normal mood and affect. Normal behavior. Normal judgment and thought content. CARDIOVASCULAR: Normal heart rate noted, regular rhythm RESPIRATORY: Effort and breath sounds normal, no problems with respiration noted ABDOMEN: Soft, nontender, nondistended. PELVIC: Deferred MUSCULOSKELETAL: Normal range of motion. No edema and no tenderness. 2+ distal pulses.    Labs: Results for orders placed or performed during the hospital encounter of 11/28/23 (from the past 2 weeks)  CBC per protocol   Collection Time: 11/28/23  2:46 PM  Result Value Ref Range   WBC 6.6 4.0 - 10.5 K/uL   RBC 5.43 (H) 3.87 - 5.11  MIL/uL   Hemoglobin 12.4 12.0 - 15.0 g/dL   HCT 16.1 09.6 - 04.5 %   MCV 71.5 (L) 80.0 - 100.0 fL   MCH 22.8 (L) 26.0 - 34.0 pg   MCHC 32.0 30.0 - 36.0 g/dL   RDW 40.9 (H) 81.1 - 91.4 %   Platelets 216 150 - 400 K/uL   nRBC 0.0 0.0 - 0.2 %     Imaging Studies: US PELVIC COMPLETE WITH TRANSVAGINAL Result Date: 11/26/2023 : PROCEDURE: US PELVIS COMPLETE WITH TRANSVAGINAL HISTORY: Patient is a 33 y/o F with menorrhagia. History of endometriosis with cystectomy. LMP 11/15/2023. COMPARISON: U/S pelvis 07/20/2021. TECHNIQUE: Two-dimensional transabdominal grayscale and color Doppler ultrasound of the pelvis was performed. Transvaginal was also performed. FINDINGS: The uterus is anteverted in position and measures 8.5 x 6.5 x 4.1 cm. It demonstrates a heterogeneous echotexture with anterior, subserosal fibroid measuring 2.8 x 1.6 cm. The endometrium measures 0.7 cm and demonstrates a normal homogeneous echotexture. Multiple nabothian cysts are noted. The right ovary measures 3.2 x 2.5 x 1.9 cm and demonstrates a normal echotexture. There is normal color Doppler flow. The left ovary measures 3.5 x 1.8 x 1.5 cm and demonstrates a normal  echotexture. There is normal color Doppler flow. There is no fluid present within the cul-de-sac. IMPRESSION: 1. Heterogeneous uterus with small subserosal fibroid measuring 2.8 cm. Examination otherwise unremarkable. Thank you for allowing [place] to assist in the care of this patient. Electronically Signed   By: Lestine Box M.D.   On: 11/26/2023 20:17    Assessment:    1. Preoperative exam for gynecologic surgery   2. Menorrhagia with regular cycle   3. History of tubal ligation   4. Subserous leiomyoma of uterus      Plan:   - Counseling: Patient desires definitive management with hysterectomy.  I discussed risks and benefits of removal vs retention of her cervix based on her age. I proposed doing a laparoscopic-assisted supracervical hysterectomy (LASH) and prophylactic bilateral salpingectomy.  No indication for oophorectomy.  Patient agrees with this proposed surgery.  Procedure, risks, reasons, benefits and complications (including injury to bowel, bladder, major blood vessel, ureter, bleeding, possibility of transfusion, infection, or fistula formation) reviewed in detail. Likelihood of success in alleviating the patient's condition was discussed. Routine postoperative instructions will be reviewed with the patient and her family in detail after surgery.  The patient concurred with the proposed plan, giving informed written consent for the surgery.   - Preop testing ordered. - Instructions reviewed, including NPO after midnight.     Hildred Laser, MD [facility]

## 2023-11-30 ENCOUNTER — Encounter: Payer: Self-pay | Admitting: Obstetrics and Gynecology

## 2023-12-02 MED ORDER — CHLORHEXIDINE GLUCONATE 0.12 % MT SOLN
15.0000 mL | Freq: Once | OROMUCOSAL | Status: AC
  Administered 2023-12-03: 15 mL via OROMUCOSAL

## 2023-12-02 MED ORDER — LACTATED RINGERS IV SOLN: INTRAVENOUS | Status: DC

## 2023-12-02 MED ORDER — ORAL CARE MOUTH RINSE: 15.0000 mL | Freq: Once | OROMUCOSAL | Status: AC

## 2023-12-03 ENCOUNTER — Ambulatory Visit
Admission: RE | Admit: 2023-12-03 | Discharge: 2023-12-03 | Disposition: A | Discharge status: Home / Self Care | Payer: Medicaid Other | Attending: Obstetrics and Gynecology | Admitting: Obstetrics and Gynecology

## 2023-12-03 ENCOUNTER — Encounter
Admission: RE | Disposition: A | Discharge status: Home / Self Care | Payer: Self-pay | Source: Home / Self Care | Attending: Obstetrics and Gynecology

## 2023-12-03 ENCOUNTER — Other Ambulatory Visit: Payer: Self-pay

## 2023-12-03 ENCOUNTER — Ambulatory Visit: Payer: Medicaid Other | Admitting: Urgent Care

## 2023-12-03 ENCOUNTER — Ambulatory Visit: Payer: Self-pay

## 2023-12-03 ENCOUNTER — Encounter: Payer: Self-pay | Admitting: Obstetrics and Gynecology

## 2023-12-03 DIAGNOSIS — Z9851 Tubal ligation status: Secondary | ICD-10-CM | POA: Insufficient documentation

## 2023-12-03 DIAGNOSIS — Z3009 Encounter for other general counseling and advice on contraception: Secondary | ICD-10-CM

## 2023-12-03 DIAGNOSIS — N946 Dysmenorrhea, unspecified: Secondary | ICD-10-CM | POA: Diagnosis not present

## 2023-12-03 DIAGNOSIS — Z01818 Encounter for other preprocedural examination: Secondary | ICD-10-CM

## 2023-12-03 DIAGNOSIS — N920 Excessive and frequent menstruation with regular cycle: Secondary | ICD-10-CM | POA: Diagnosis not present

## 2023-12-03 DIAGNOSIS — G43829 Menstrual migraine, not intractable, without status migrainosus: Secondary | ICD-10-CM | POA: Insufficient documentation

## 2023-12-03 DIAGNOSIS — Z90711 Acquired absence of uterus with remaining cervical stump: Secondary | ICD-10-CM

## 2023-12-03 DIAGNOSIS — D259 Leiomyoma of uterus, unspecified: Secondary | ICD-10-CM | POA: Diagnosis not present

## 2023-12-03 HISTORY — PX: LAPAROSCOPIC SUPRACERVICAL HYSTERECTOMY: SHX5399

## 2023-12-03 LAB — COMPREHENSIVE METABOLIC PANEL
ALT: 17 U/L (ref 0–44)
AST: 16 U/L (ref 15–41)
Albumin: 3.7 g/dL (ref 3.5–5.0)
Alkaline Phosphatase: 47 U/L (ref 38–126)
Anion gap: 9 (ref 5–15)
BUN: 12 mg/dL (ref 6–20)
CO2: 24 mmol/L (ref 22–32)
Calcium: 8.7 mg/dL — ABNORMAL LOW (ref 8.9–10.3)
Chloride: 106 mmol/L (ref 98–111)
Creatinine, Ser: 0.75 mg/dL (ref 0.44–1.00)
GFR, Estimated: >60 mL/min (ref >60)
Glucose, Bld: 88 mg/dL (ref 70–99)
Potassium: 3.9 mmol/L (ref 3.5–5.1)
Sodium: 139 mmol/L (ref 135–145)
Total Bilirubin: 1 mg/dL (ref 0.0–1.2)
Total Protein: 7 g/dL (ref 6.5–8.1)

## 2023-12-03 LAB — POCT PREGNANCY, URINE: Preg Test, Ur: NEGATIVE

## 2023-12-03 SURGERY — HYSTERECTOMY, SUPRACERVICAL, LAPAROSCOPIC
Anesthesia: General | Laterality: Bilateral

## 2023-12-03 MED ORDER — CEFAZOLIN SODIUM-DEXTROSE 2-4 GM/100ML-% IV SOLN
2.0000 g | INTRAVENOUS | Status: AC
  Administered 2023-12-03: 2 g via INTRAVENOUS

## 2023-12-03 MED ORDER — PROPOFOL 10 MG/ML IV BOLUS
INTRAVENOUS | Status: AC
  Filled 2023-12-03: qty 20

## 2023-12-03 MED ORDER — 0.9 % SODIUM CHLORIDE (POUR BTL) OPTIME
TOPICAL | Status: DC | PRN
  Administered 2023-12-03: 500 mL

## 2023-12-03 MED ORDER — FENTANYL CITRATE (PF) 100 MCG/2ML IJ SOLN
INTRAMUSCULAR | Status: DC | PRN
  Administered 2023-12-03 (×4): 25 ug via INTRAVENOUS
  Administered 2023-12-03 (×2): 50 ug via INTRAVENOUS

## 2023-12-03 MED ORDER — MICROFIBRILLAR COLL HEMOSTAT EX PADS
MEDICATED_PAD | CUTANEOUS | Status: DC | PRN
  Administered 2023-12-03: 1 via TOPICAL

## 2023-12-03 MED ORDER — ROCURONIUM BROMIDE 100 MG/10ML IV SOLN
INTRAVENOUS | Status: DC | PRN
  Administered 2023-12-03 (×2): 20 mg via INTRAVENOUS
  Administered 2023-12-03: 10 mg via INTRAVENOUS
  Administered 2023-12-03: 50 mg via INTRAVENOUS
  Administered 2023-12-03: 10 mg via INTRAVENOUS

## 2023-12-03 MED ORDER — GABAPENTIN 300 MG PO CAPS
ORAL_CAPSULE | ORAL | Status: AC
  Filled 2023-12-03: qty 1

## 2023-12-03 MED ORDER — PROPOFOL 10 MG/ML IV BOLUS
INTRAVENOUS | Status: DC | PRN
  Administered 2023-12-03: 20 ug/kg/min via INTRAVENOUS
  Administered 2023-12-03: 200 mg via INTRAVENOUS
  Administered 2023-12-03: 40 mg via INTRAVENOUS

## 2023-12-03 MED ORDER — ROCURONIUM BROMIDE 10 MG/ML (PF) SYRINGE
PREFILLED_SYRINGE | INTRAVENOUS | Status: AC
  Filled 2023-12-03: qty 10

## 2023-12-03 MED ORDER — ONDANSETRON HCL 4 MG/2ML IJ SOLN
INTRAMUSCULAR | Status: DC | PRN
  Administered 2023-12-03: 4 mg via INTRAVENOUS

## 2023-12-03 MED ORDER — ACETAMINOPHEN 10 MG/ML IV SOLN: 1000.0000 mg | Freq: Once | INTRAVENOUS | Status: DC | PRN

## 2023-12-03 MED ORDER — SIMETHICONE 80 MG PO TABS: 1.0000 | ORAL_TABLET | Freq: Four times a day (QID) | ORAL | 1 refills | Status: DC | PRN

## 2023-12-03 MED ORDER — OXYCODONE HCL 5 MG PO TABS
ORAL_TABLET | ORAL | Status: AC
  Filled 2023-12-03: qty 1

## 2023-12-03 MED ORDER — OXYCODONE HCL 5 MG/5ML PO SOLN: 5.0000 mg | Freq: Once | ORAL | Status: AC | PRN

## 2023-12-03 MED ORDER — FENTANYL CITRATE (PF) 100 MCG/2ML IJ SOLN
INTRAMUSCULAR | Status: AC
  Filled 2023-12-03: qty 2

## 2023-12-03 MED ORDER — OXYCODONE HCL 5 MG PO TABS
5.0000 mg | ORAL_TABLET | Freq: Once | ORAL | Status: AC | PRN
  Administered 2023-12-03: 5 mg via ORAL

## 2023-12-03 MED ORDER — EPHEDRINE 5 MG/ML INJ
INTRAVENOUS | Status: AC
  Filled 2023-12-03: qty 5

## 2023-12-03 MED ORDER — MIDAZOLAM HCL 2 MG/2ML IJ SOLN
INTRAMUSCULAR | Status: AC
  Filled 2023-12-03: qty 2

## 2023-12-03 MED ORDER — GABAPENTIN 300 MG PO CAPS
300.0000 mg | ORAL_CAPSULE | ORAL | Status: AC
  Administered 2023-12-03: 300 mg via ORAL

## 2023-12-03 MED ORDER — PROPOFOL 10 MG/ML IV BOLUS
INTRAVENOUS | Status: AC
  Filled 2023-12-03: qty 40

## 2023-12-03 MED ORDER — IBUPROFEN 800 MG PO TABS: 800.0000 mg | ORAL_TABLET | Freq: Three times a day (TID) | ORAL | 1 refills | Status: DC | PRN

## 2023-12-03 MED ORDER — FENTANYL CITRATE (PF) 100 MCG/2ML IJ SOLN
25.0000 ug | INTRAMUSCULAR | Status: DC | PRN
  Administered 2023-12-03 (×2): 50 ug via INTRAVENOUS

## 2023-12-03 MED ORDER — SUGAMMADEX SODIUM 200 MG/2ML IV SOLN
INTRAVENOUS | Status: DC | PRN
  Administered 2023-12-03 (×2): 200 mg via INTRAVENOUS

## 2023-12-03 MED ORDER — BUPIVACAINE HCL (PF) 0.5 % IJ SOLN
INTRAMUSCULAR | Status: AC
  Filled 2023-12-03: qty 30

## 2023-12-03 MED ORDER — SODIUM CHLORIDE 0.9 % IR SOLN
Status: DC | PRN
  Administered 2023-12-03: 1000 mL

## 2023-12-03 MED ORDER — DOCUSATE SODIUM 100 MG PO CAPS: 100.0000 mg | ORAL_CAPSULE | Freq: Two times a day (BID) | ORAL | 2 refills | Status: DC | PRN

## 2023-12-03 MED ORDER — ACETAMINOPHEN 500 MG PO TABS
ORAL_TABLET | ORAL | Status: AC
  Filled 2023-12-03: qty 2

## 2023-12-03 MED ORDER — LIDOCAINE HCL (PF) 2 % IJ SOLN
INTRAMUSCULAR | Status: AC
  Filled 2023-12-03: qty 5

## 2023-12-03 MED ORDER — ACETAMINOPHEN 500 MG PO TABS
1000.0000 mg | ORAL_TABLET | ORAL | Status: AC
  Administered 2023-12-03: 1000 mg via ORAL

## 2023-12-03 MED ORDER — DEXAMETHASONE SODIUM PHOSPHATE 10 MG/ML IJ SOLN
INTRAMUSCULAR | Status: DC | PRN
  Administered 2023-12-03: 10 mg via INTRAVENOUS

## 2023-12-03 MED ORDER — EPHEDRINE SULFATE-NACL 50-0.9 MG/10ML-% IV SOSY
PREFILLED_SYRINGE | INTRAVENOUS | Status: DC | PRN
  Administered 2023-12-03: 5 mg via INTRAVENOUS
  Administered 2023-12-03: 10 mg via INTRAVENOUS
  Administered 2023-12-03: 5 mg via INTRAVENOUS

## 2023-12-03 MED ORDER — MIDAZOLAM HCL 2 MG/2ML IJ SOLN
INTRAMUSCULAR | Status: DC | PRN
  Administered 2023-12-03: 2 mg via INTRAVENOUS

## 2023-12-03 MED ORDER — ONDANSETRON HCL 4 MG/2ML IJ SOLN
INTRAMUSCULAR | Status: AC
  Filled 2023-12-03: qty 2

## 2023-12-03 MED ORDER — CELECOXIB 200 MG PO CAPS
ORAL_CAPSULE | ORAL | Status: AC
  Filled 2023-12-03: qty 1

## 2023-12-03 MED ORDER — BUPIVACAINE HCL 0.5 % IJ SOLN
INTRAMUSCULAR | Status: DC | PRN
  Administered 2023-12-03: 20 mL

## 2023-12-03 MED ORDER — HYDROCODONE-ACETAMINOPHEN 5-325 MG PO TABS: 1.0000 | ORAL_TABLET | ORAL | 0 refills | Status: DC | PRN

## 2023-12-03 MED ORDER — PROPOFOL 10 MG/ML IV BOLUS: INTRAVENOUS | Status: DC | PRN

## 2023-12-03 MED ORDER — PHENYLEPHRINE 80 MCG/ML (10ML) SYRINGE FOR IV PUSH (FOR BLOOD PRESSURE SUPPORT)
PREFILLED_SYRINGE | INTRAVENOUS | Status: AC
  Filled 2023-12-03: qty 20

## 2023-12-03 MED ORDER — DEXAMETHASONE SODIUM PHOSPHATE 10 MG/ML IJ SOLN
INTRAMUSCULAR | Status: AC
  Filled 2023-12-03: qty 1

## 2023-12-03 MED ORDER — LIDOCAINE HCL (CARDIAC) PF 100 MG/5ML IV SOSY
PREFILLED_SYRINGE | INTRAVENOUS | Status: DC | PRN
  Administered 2023-12-03: 80 mg via INTRAVENOUS

## 2023-12-03 MED ORDER — GLYCOPYRROLATE 0.2 MG/ML IJ SOLN
INTRAMUSCULAR | Status: DC | PRN
  Administered 2023-12-03: .2 mg via INTRAVENOUS

## 2023-12-03 MED ORDER — LACTATED RINGERS IV SOLN: INTRAVENOUS | Status: AC

## 2023-12-03 MED ORDER — CHLORHEXIDINE GLUCONATE 0.12 % MT SOLN
OROMUCOSAL | Status: AC
  Filled 2023-12-03: qty 15

## 2023-12-03 MED ORDER — ONDANSETRON HCL 4 MG/2ML IJ SOLN: 4.0000 mg | Freq: Once | INTRAMUSCULAR | Status: DC | PRN

## 2023-12-03 MED ORDER — CEFAZOLIN SODIUM-DEXTROSE 2-4 GM/100ML-% IV SOLN
INTRAVENOUS | Status: AC
  Filled 2023-12-03: qty 100

## 2023-12-03 MED ORDER — CELECOXIB 200 MG PO CAPS
400.0000 mg | ORAL_CAPSULE | ORAL | Status: AC
  Administered 2023-12-03: 400 mg via ORAL

## 2023-12-03 MED ORDER — POVIDONE-IODINE 10 % EX SWAB
2.0000 | Freq: Once | CUTANEOUS | Status: AC
  Administered 2023-12-03: 2 via TOPICAL

## 2023-12-03 MED ORDER — KETAMINE HCL 50 MG/5ML IJ SOSY
PREFILLED_SYRINGE | INTRAMUSCULAR | Status: DC | PRN
  Administered 2023-12-03: 10 mg via INTRAVENOUS
  Administered 2023-12-03: 20 mg via INTRAVENOUS

## 2023-12-03 MED ORDER — KETAMINE HCL 50 MG/5ML IJ SOSY
PREFILLED_SYRINGE | INTRAMUSCULAR | Status: AC
  Filled 2023-12-03: qty 5

## 2023-12-03 MED ORDER — PHENYLEPHRINE 80 MCG/ML (10ML) SYRINGE FOR IV PUSH (FOR BLOOD PRESSURE SUPPORT)
PREFILLED_SYRINGE | INTRAVENOUS | Status: DC | PRN
  Administered 2023-12-03: 160 ug via INTRAVENOUS
  Administered 2023-12-03 (×3): 80 ug via INTRAVENOUS
  Administered 2023-12-03: 160 ug via INTRAVENOUS
  Administered 2023-12-03: 80 ug via INTRAVENOUS
  Administered 2023-12-03 (×3): 160 ug via INTRAVENOUS

## 2023-12-03 SURGICAL SUPPLY — 49 items
BAG LAPAROSCOPIC 12 15 PORT 16 (BASKET) IMPLANT
BAG RETRIEVAL 12/15 (BASKET) ×1
BAG URINE DRAIN 2000ML AR STRL (UROLOGICAL SUPPLIES) ×1 IMPLANT
BARRIER ADHS 3X4 INTERCEED (GAUZE/BANDAGES/DRESSINGS) IMPLANT
BLADE SURG SZ11 CARB STEEL (BLADE) ×1 IMPLANT
CABLE HIGH FREQUENCY MONO STRZ (ELECTRODE) IMPLANT
CATH URTH 16FR FL 2W BLN LF (CATHETERS) ×1 IMPLANT
CHLORAPREP W/TINT 26 (MISCELLANEOUS) ×1 IMPLANT
DERMABOND ADVANCED .7 DNX12 (GAUZE/BANDAGES/DRESSINGS) ×1 IMPLANT
DRESSING SURGICEL FIBRLLR 1X2 (HEMOSTASIS) IMPLANT
DRSG SURGICEL FIBRILLAR 1X2 (HEMOSTASIS) ×1
DRSG TELFA 3X8 NADH STRL (GAUZE/BANDAGES/DRESSINGS) IMPLANT
ELECT REM PT RETURN 9FT ADLT (ELECTROSURGICAL) ×1
ELECTRODE REM PT RTRN 9FT ADLT (ELECTROSURGICAL) ×1 IMPLANT
GAUZE 4X4 16PLY ~~LOC~~+RFID DBL (SPONGE) ×1 IMPLANT
GLOVE BIO SURGEON STRL SZ 6.5 (GLOVE) ×1 IMPLANT
GLOVE BIO SURGEON STRL SZ8 (GLOVE) ×1 IMPLANT
GLOVE INDICATOR 7.0 STRL GRN (GLOVE) ×1 IMPLANT
GLOVE INDICATOR 8.0 STRL GRN (GLOVE) ×1 IMPLANT
GOWN STRL REUS W/ TWL LRG LVL3 (GOWN DISPOSABLE) ×2 IMPLANT
IRRIGATION STRYKERFLOW (MISCELLANEOUS) ×1 IMPLANT
IRRIGATOR STRYKERFLOW (MISCELLANEOUS) ×1
IV NS 1000ML BAXH (IV SOLUTION) IMPLANT
KIT PINK PAD W/HEAD ARE REST (MISCELLANEOUS) ×1
KIT PINK PAD W/HEAD ARM REST (MISCELLANEOUS) ×1 IMPLANT
KIT TURNOVER CYSTO (KITS) ×1 IMPLANT
LIGASURE LAP MARYLAND 5MM 37CM (ELECTROSURGICAL) IMPLANT
MANIFOLD NEPTUNE II (INSTRUMENTS) ×1 IMPLANT
MANIPULATOR VCARE LG CRV RETR (MISCELLANEOUS) IMPLANT
MORCELLATOR XCISE COR (MISCELLANEOUS) IMPLANT
NS IRRIG 500ML POUR BTL (IV SOLUTION) ×1 IMPLANT
PACK GYN LAPAROSCOPIC (MISCELLANEOUS) ×1 IMPLANT
PAD OB MATERNITY 11 LF (PERSONAL CARE ITEMS) ×1 IMPLANT
PAD PREP OB/GYN DISP 24X41 (PERSONAL CARE ITEMS) ×1 IMPLANT
RETRACTOR RING XSMALL (MISCELLANEOUS) IMPLANT
RTRCTR WOUND ALEXIS 13CM XS SH (MISCELLANEOUS) ×1
SCRUB CHG 4% DYNA-HEX 4OZ (MISCELLANEOUS) ×1 IMPLANT
SHEARS HARMONIC 36 ACE (MISCELLANEOUS) ×1 IMPLANT
SLEEVE Z-THREAD 5X100MM (TROCAR) ×2 IMPLANT
STRIP CLOSURE SKIN 1/2X4 (GAUZE/BANDAGES/DRESSINGS) ×1 IMPLANT
SUT MNCRL 4-0 27 PS-2 XMFL (SUTURE) ×1
SUT VIC AB 0 CT2 27 (SUTURE) ×1 IMPLANT
SUT VIC AB 3-0 SH 27X BRD (SUTURE) IMPLANT
SUTURE MNCRL 4-0 27XMF (SUTURE) ×1 IMPLANT
SYR 10ML LL (SYRINGE) ×1 IMPLANT
TRAP FLUID SMOKE EVACUATOR (MISCELLANEOUS) ×1 IMPLANT
TROCAR Z-THREAD FIOS 5X100MM (TROCAR) ×1 IMPLANT
TUBING EVAC SMOKE HEATED PNEUM (TUBING) ×1 IMPLANT
WATER STERILE IRR 500ML POUR (IV SOLUTION) ×1 IMPLANT

## 2023-12-03 NOTE — H&P (Signed)
GYNECOLOGY PREOPERATIVE HISTORY AND PHYSICAL   Subjective:  Hannah Ball is a 33 y.o. [id number] here for surgical management of menorrhagia with regular cycle.  She had a tubal ligation performed in 2023 and notes that a few months later began noting heavier cycles.  Cycles are lasting almost 2 weeks, and requires use of ultra tampons and overnight pads, having to change every 1-2 hours.  Was tried on Lysteda which worked well for several months, but then cycles started getting heavy again. Was then sent a prescription for Junel but did not start on it. Now having menstrual migraines so prefers not to utilize. Prescribed Aygestin which patient also has not utilized. Does report remote history of diagnosis of endometriosis. No significant preoperative concerns.  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)  Date: 10/10/2022   Past Medical History:  Diagnosis Date   Cancer (HCC)    COVID-19 12/07/2019   +12/05/19   Migraines    Nausea/vomiting in pregnancy 10/26/2016   Trial of diclegis    Ovarian cyst    Rubella non-immune status, antepartum 08/14/2017   Needs MMR PP   Type A blood, Rh negative     Past Surgical History:  Procedure Laterality Date   LAPAROSCOPIC OVARIAN CYSTECTOMY     OVARIAN CYST REMOVAL  2008   TUBAL LIGATION N/A 08/24/2022   Procedure: POST PARTUM TUBAL LIGATION;  Surgeon: Hildred Laser, MD;  Location: [facility] ORS;  Service: Gynecology;  Laterality: N/A;    OB History  Gravida Para Term Preterm AB Living  5 4 4  0 0 4  SAB IAB Ectopic Multiple Live Births  0 0 0 0 4    # Outcome Date GA Lbr Len/2nd Weight Sex Type Anes PTL Lv  5  Gravida           4 Term 06/30/20 [redacted]w[redacted]d 27:35 / 00:20 3970 g F Vag-Spont EPI  LIV  3 Term 04/13/18 [redacted]w[redacted]d 138:05 / 00:05 3790 g F Vag-Spont EPI  LIV  2 Term 05/09/17 [redacted]w[redacted]d 11:49 / 02:13 4880 g M Vag-Vacuum EPI  LIV     Birth Comments: WNL  1 Term 2013   3544 g M Vag-Spont EPI  LIV     Complications: Ovarian cyst during pregnancy    Family History  Problem Relation Age of Onset   Diabetes Maternal Grandfather    Kidney disease Mother    Social History   Socioeconomic History   Marital status: Married    Spouse name: Ephriam Knuckles    Number of children: Not on file   Years of education: Not on file   Highest education level: Not on file  Occupational History   Not on file  Tobacco Use   Smoking status: Never   Smokeless tobacco: Never  Vaping Use   Vaping status: Former  Substance and Sexual Activity   Alcohol use: Not Currently   Drug use: No   Sexual activity: Yes    Partners: Male    Birth control/protection: Surgical    Comment: BTL  Other Topics Concern   Not on file  Social History Narrative   ** Merged History Encounter **       Social Drivers of Health  Financial Resource Strain: Not on file  Food Insecurity: No Food Insecurity (08/23/2022)   Hunger Vital Sign    Worried About Running Out of Food in the Last Year: Never true    Ran Out of Food in the Last Year: Never true  Transportation Needs: No Transportation Needs (08/23/2022)   PRAPARE - Administrator, Civil Service (Medical): No    Lack of Transportation (Non-Medical): No  Physical Activity: Not on file  Stress: Not on file  Social Connections: Not on file  Intimate Partner Violence: Not At Risk (08/23/2022)   Humiliation, Afraid, Rape, and Kick questionnaire    Fear of Current or Ex-Partner: No    Emotionally Abused: No    Physically Abused: No    Sexually Abused: No    No current facility-administered medications on file prior to encounter.   No current outpatient medications on  file prior to encounter.    Allergies  Allergen Reactions   Benadryl [Diphenhydramine Hcl (Sleep)] Hives   Latex Hives   Neo-Bacit-Poly-Lidocaine Other (See Comments)    Skin burns   Neosporin Wound Cleanser [Benzalkonium Chloride] Hives   Penicillins Rash    Has patient had a PCN reaction causing immediate rash, facial/tongue/throat swelling, SOB or lightheadedness with hypotension: Yes Has patient had a PCN reaction causing severe rash involving mucus membranes or skin necrosis: No Has patient had a PCN reaction that required hospitalization No Has patient had a PCN reaction occurring within the last 10 years: No If all of the above answers are "NO", then may proceed with Cephalosporin use.      Review of Systems Constitutional: No recent fever/chills/sweats Respiratory: No recent cough/bronchitis Cardiovascular: No chest pain Gastrointestinal: No recent nausea/vomiting/diarrhea Genitourinary: No UTI symptoms Hematologic/lymphatic:No history of coagulopathy or recent blood thinner use    Objective:   Blood pressure 107/72, pulse 84, temperature (!) 96.9 F (36.1 C), temperature source Temporal, resp. rate 16, last menstrual period 11/15/2023, SpO2 97%.  There is no height or weight on file to calculate BMI. CONSTITUTIONAL: Well-developed, well-nourished female in no acute distress.  HENT:  Normocephalic, atraumatic, External right and left ear normal. Oropharynx is clear and moist EYES: Conjunctivae and EOM are normal. Pupils are equal, round, and reactive to light. No scleral icterus.  NECK: Normal range of motion, supple, no masses SKIN: Skin is warm and dry. No rash noted. Not diaphoretic. No erythema. No pallor. NEUROLOGIC: Alert and oriented to person, place, and time. Normal reflexes, muscle tone coordination. No cranial nerve deficit noted. PSYCHIATRIC: Normal mood and affect. Normal behavior. Normal judgment and thought content. CARDIOVASCULAR: Normal heart rate  noted, regular rhythm RESPIRATORY: Effort and breath sounds normal, no problems with respiration noted ABDOMEN: Soft, nontender, nondistended. PELVIC: Deferred MUSCULOSKELETAL: Normal range of motion. No edema and no tenderness. 2+ distal pulses.    Labs: Results for orders placed or performed during the hospital encounter of 12/03/23 (from the past 2 weeks)  Pregnancy, urine POC   Collection Time: 12/03/23  7:53 AM  Result Value Ref Range   Preg Test, Ur NEGATIVE NEGATIVE  Comprehensive metabolic panel   Collection Time: 12/03/23  8:36 AM  Result Value Ref Range   Sodium 139 135 - 145 mmol/L   Potassium 3.9 3.5 - 5.1 mmol/L   Chloride 106 98 - 111 mmol/L   CO2 24 22 - 32 mmol/L   Glucose, Bld 88 70 - 99 mg/dL   BUN 12 6 - 20 mg/dL   Creatinine, Ser 1.61  0.44 - 1.00 mg/dL   Calcium 8.7 (L) 8.9 - 10.3 mg/dL   Total Protein 7.0 6.5 - 8.1 g/dL   Albumin 3.7 3.5 - 5.0 g/dL   AST 16 15 - 41 U/L   ALT 17 0 - 44 U/L   Alkaline Phosphatase 47 38 - 126 U/L   Total Bilirubin 1.0 0.0 - 1.2 mg/dL   GFR, Estimated >16 >10 mL/min   Anion gap 9 5 - 15  Results for orders placed or performed during the hospital encounter of 11/28/23 (from the past 2 weeks)  CBC per protocol   Collection Time: 11/28/23  2:46 PM  Result Value Ref Range   WBC 6.6 4.0 - 10.5 K/uL   RBC 5.43 (H) 3.87 - 5.11 MIL/uL   Hemoglobin 12.4 12.0 - 15.0 g/dL   HCT 96.0 45.4 - 09.8 %   MCV 71.5 (L) 80.0 - 100.0 fL   MCH 22.8 (L) 26.0 - 34.0 pg   MCHC 32.0 30.0 - 36.0 g/dL   RDW 11.9 (H) 14.7 - 82.9 %   Platelets 216 150 - 400 K/uL   nRBC 0.0 0.0 - 0.2 %  Type and screen [facility]   Collection Time: 11/28/23  2:46 PM  Result Value Ref Range   ABO/RH(D) A NEG    Antibody Screen NEG    Sample Expiration 12/12/2023,2359    Extend sample reason      NO TRANSFUSIONS OR PREGNANCY IN THE PAST 3 MONTHS Performed at [facility], [address] [place], [place] [address]       Imaging Studies: US PELVIC COMPLETE WITH TRANSVAGINAL Result Date: 11/26/2023 : PROCEDURE: US PELVIS COMPLETE WITH TRANSVAGINAL HISTORY: Patient is a 33 y/o F with menorrhagia. History of endometriosis with cystectomy. LMP 11/15/2023. COMPARISON: U/S pelvis 07/20/2021. TECHNIQUE: Two-dimensional transabdominal grayscale and color Doppler ultrasound of the pelvis was performed. Transvaginal was also performed. FINDINGS: The uterus is anteverted in position and measures 8.5 x 6.5 x 4.1 cm. It demonstrates a heterogeneous echotexture with anterior, subserosal fibroid measuring 2.8 x 1.6 cm. The endometrium measures 0.7 cm and demonstrates a normal homogeneous echotexture. Multiple nabothian cysts are noted. The right ovary measures 3.2 x 2.5 x 1.9 cm and demonstrates a normal echotexture. There is normal color Doppler flow. The left ovary measures 3.5 x 1.8 x 1.5 cm and demonstrates a normal echotexture. There is normal color Doppler flow. There is no fluid present within the cul-de-sac. IMPRESSION: 1. Heterogeneous uterus with small subserosal fibroid measuring 2.8 cm. Examination otherwise unremarkable. Thank you for allowing [place] to assist in the care of this patient. Electronically Signed   By: Lestine Box M.D.   On: 11/26/2023 20:17    Assessment:    Abnormal uterine bleeding Leiomyoma of uterus History of anemia   Plan:   - Counseling: Patient desires definitive management with hysterectomy.  I discussed risks and benefits of removal vs retention of her cervix based on her age. I proposed doing a laparoscopic-assisted supracervical hysterectomy (LASH) and prophylactic bilateral salpingectomy.  No indication for oophorectomy.  Patient agrees with this proposed surgery.  Procedure, risks, reasons, benefits and complications (including injury to bowel, bladder, major blood vessel, ureter, bleeding, possibility of transfusion, infection, or fistula formation) reviewed in detail. Likelihood of  success in alleviating the patient's condition was discussed. Routine postoperative instructions will be reviewed with the patient and her family in detail after surgery.  The patient concurred with the proposed plan, giving informed written consent  for the surgery.   - Preop testing reviewed. - Has been NPO since midnight.     Hildred Laser, MD [facility]

## 2023-12-03 NOTE — Discharge Instructions (Signed)
General Gynecological Post-Operative Instructions °You may expect to feel dizzy, weak, and drowsy for as long as 24 hours after receiving the medicine that made you sleep (anesthetic).  °Do not drive a car, ride a bicycle, participate in physical activities, or take public transportation until you are done taking narcotic pain medicines or as directed by your doctor.  °Do not drink alcohol or take tranquilizers.  °Do not take medicine that has not been prescribed by your doctor.  °Do not sign important papers or make important decisions while on narcotic pain medicines.  °Have a responsible person with you.  °CARE OF INCISION  °Keep incision clean and dry. °Take showers instead of baths until your doctor gives you permission to take baths.  °Avoid heavy lifting (more than 10 pounds/4.5 kilograms), pushing, or pulling.  °Avoid activities that may risk injury to your surgical site.  °No sexual intercourse or placement of anything in the vagina for 6 weeks or as instructed by your doctor. °If you have tubes coming from the wound site, check with your doctor regarding appropriate care of the tubes. °Only take prescription or over-the-counter medicines  for pain, discomfort, or fever as directed by your doctor. Do not take aspirin. It can make you bleed. Take medicines (antibiotics) that kill germs if they are prescribed for you.  °Call the office or go to the Emergency Room if:  °You feel sick to your stomach (nauseous).  °You start to throw up (vomit).  °You have trouble eating or drinking.  °You have an oral temperature above 101.  °You have constipation that is not helped by adjusting diet or increasing fluid intake. Pain medicines are a common cause of constipation.  °You have any other concerns. °SEEK IMMEDIATE MEDICAL CARE IF:  °You have persistent dizziness.  °You have difficulty breathing or a congested sounding (croupy) cough.  °You have an oral temperature above 102.5, not controlled by medicine.  °There is  increasing pain or tenderness near or in the surgical site.  ° ° °

## 2023-12-03 NOTE — Op Note (Signed)
Procedure(s): LAPAROSCOPIC SUPRACERVICAL HYSTERECTOMY WITH BILATERAL SALPINGECTOMY Procedure Note  Hannah Ball female 33 y.o. 12/03/2023  Indications: The patient is a 33 y.o. G89P4004 female with menorrhagia with regular cycle, fibroid uterus, history of anemia  Pre-operative Diagnosis:  Menorrhagia with regular cycle, fibroid uterus, history of anemia  Post-operative Diagnosis: Same  Surgeon: Hildred Laser, MD  Assistants:  Dr. Brennan Bailey.   Anesthesia: General endotracheal anesthesia  Findings: The uterus was sounded to 8 cm. Uterus appeared mottled in appearance Fallopian tubes and ovaries appeared normal. Fallopian tubes with mild hyperemia, previously surgically interrupted.  Area of powder burn lesions along right pelvic sidewall, suspicious for endometriosis.  Normal upper abdomen   Procedure Details: The patient was seen in the Holding Room. The risks, benefits, complications, treatment options, and expected outcomes were discussed with the patient.  The patient concurred with the proposed plan, giving informed consent.  The site of surgery properly noted/marked. The patient was taken to the Operating Room, identified as Amado Nash and the procedure verified as Procedure(s) (LRB): LAPAROSCOPIC SUPRACERVICAL HYSTERECTOMY WITH BILATERAL SALPINGECTOMY (Bilateral).   She was then placed under general anesthesia without difficulty. She was placed in the dorsal lithotomy position, and was prepped and draped in a sterile manner. A Time Out was held and the above information confirmed. A foley catheter was placed. A sterile speculum was inserted into the vagina and the cervix was grasped at the anterior lip using a single-toothed tenaculum.  The uterus was sounded to 8 cm, and a large V-care device was placed for uterine manipulation. The speculum and tenaculum were then removed. Attention was turned to the abdomen where an umbilical incision was made with the  scalpel.  The Optiview 5-mm trocar and sleeve were then advanced without difficulty with the laparoscope under direct visualization into the abdomen.  The abdomen was then insufflated with carbon dioxide gas and adequate pneumoperitoneum was obtained. Bilateral 5-mm lower quadrant ports were then placed under direct visualization.  A survey of the patient's pelvis and abdomen revealed the findings as above.   On the right side, the mesosalpinx of the fallopian tube was clamped and transected.  The round ligament was then clamped and transected with the Ligasure device. The leaves of the broad ligament were separated and serially transected. A bladder flap was created and the uterine artery was then skeletonized. These procedures were then repeated on the left side.  The ureters were noted to be safely away from the area of dissection.  At this point, attention was turned to the uterine vessels, which were clamped, coagulated and transected using the Ligasure device.   The uterus appeared devascularized, and the fundus was divided from the cervix with the Ligasure device, superior to the transected vessels and above the arch of the uterosacral ligaments. The endocervical tissue was then systemically desiccated.  All significant tissue fragments were removed, and the pelvis was irrigated. Fibrillar was placed over the cervical stump to aid with hemostasis.   Next the umbilical incision was extended to accommodate a 15 mm laparoscopic bag which was inserted into the incision.  The uterus was placed into the bag and brought up to the incision.   The uterus was them morcellated using the Mayo scissors until the bag with remaining specimen could be brought out of the incision.   The expanded umbilical fascial incision was grasped at each end with Kocher clamps, and was reapproximated with 0 Vicryl in a running fashion. The subcutaneous tissue layer was then  reapproximated with a running suture of 3-0 Vicryl. All skin  incisions were closed with 4-0 Monocryl subcuticular stitches. The incisions were injected with a total of 20 ml of 0.5% Sensorcaine.  Dermabond was placed over the incisions. The patient tolerated the procedures well.  All instruments, needles, and sponge counts were correct x 2. The patient was taken to the recovery room awake, extubated and in stable condition.   An experienced assistant was required given the standard of surgical care given the complexity of the case.  This assistant was needed for exposure, dissection, suctioning, retraction, instrument exchange, and for overall help during the procedure.   Estimated Blood Loss:  250 ml      Drains: straight catheterization prior to procedure with  ml of clear urine         Total IV Fluids:  1300 ml  Specimens: Uterus, bilateral fallopian tubes         Implants: None         Complications:  None; patient tolerated the procedure well.         Disposition: PACU - hemodynamically stable.         Condition: stable    Hildred Laser, MD [place]

## 2023-12-03 NOTE — Anesthesia Procedure Notes (Signed)
Procedure Name: Intubation Date/Time: 12/03/2023 10:30 AM  Performed by: Marcy Siren, RNPre-anesthesia Checklist: Patient identified, Patient being monitored, Emergency Drugs available and Suction available Patient Re-evaluated:Patient Re-evaluated prior to induction Oxygen Delivery Method: Circle System Utilized Preoxygenation: Pre-oxygenation with 100% oxygen Induction Type: IV induction Ventilation: Mask ventilation without difficulty Laryngoscope Size: 3 and McGrath Grade View: Grade I Tube type: Oral Tube size: 7.0 mm Number of attempts: 1 Airway Equipment and Method: Stylet Placement Confirmation: ETT inserted through vocal cords under direct vision, positive ETCO2 and breath sounds checked- equal and bilateral Secured at: 22 cm Tube secured with: Tape Dental Injury: Teeth and Oropharynx as per pre-operative assessment

## 2023-12-03 NOTE — Anesthesia Preprocedure Evaluation (Addendum)
Anesthesia Evaluation  Patient identified by MRN, date of birth, ID band Patient awake    Reviewed: Allergy & Precautions, NPO status , Patient's Chart, lab work & pertinent test results  History of Anesthesia Complications Negative for: history of anesthetic complications  Airway Mallampati: I   Neck ROM: Full    Dental no notable dental hx.    Pulmonary neg pulmonary ROS   Pulmonary exam normal breath sounds clear to auscultation       Cardiovascular Exercise Tolerance: Good negative cardio ROS Normal cardiovascular exam Rhythm:Regular Rate:Normal     Neuro/Psych  Headaches    GI/Hepatic negative GI ROS,,,  Endo/Other  Obesity   Renal/GU negative Renal ROS     Musculoskeletal   Abdominal   Peds  Hematology negative hematology ROS (+)   Anesthesia Other Findings   Reproductive/Obstetrics                             Anesthesia Physical Anesthesia Plan  ASA: 2  Anesthesia Plan: General   Post-op Pain Management:    Induction: Intravenous  PONV Risk Score and Plan: 3 and Ondansetron, Dexamethasone and Treatment may vary due to age or medical condition  Airway Management Planned: Oral ETT  Additional Equipment:   Intra-op Plan:   Post-operative Plan: Extubation in OR  Informed Consent: I have reviewed the patients History and Physical, chart, labs and discussed the procedure including the risks, benefits and alternatives for the proposed anesthesia with the patient or authorized representative who has indicated his/her understanding and acceptance.     Dental advisory given  Plan Discussed with: CRNA  Anesthesia Plan Comments: (Patient consented for risks of anesthesia including but not limited to:  - adverse reactions to medications - damage to eyes, teeth, lips or other oral mucosa - nerve damage due to positioning  - sore throat or hoarseness - damage to heart,  brain, nerves, lungs, other parts of body or loss of life  Informed patient about role of CRNA in peri- and intra-operative care.  Patient voiced understanding.)       Anesthesia Quick Evaluation

## 2023-12-03 NOTE — Transfer of Care (Signed)
Immediate Anesthesia Transfer of Care Note  Patient: Hannah Ball  Procedure(s) Performed: LAPAROSCOPIC SUPRACERVICAL HYSTERECTOMY WITH BILATERAL SALPINGECTOMY (Bilateral)  Patient Location: PACU  Anesthesia Type:General  Level of Consciousness: awake, alert , and patient cooperative  Airway & Oxygen Therapy: Patient Spontanous Breathing  Post-op Assessment: Report given to RN, Post -op Vital signs reviewed and stable, and Patient moving all extremities  Post vital signs: Reviewed and stable  Last Vitals:  Vitals Value Taken Time  BP 103/63 12/03/23 1345  Temp 36.7 C 12/03/23 1341  Pulse 80 12/03/23 1348  Resp 20 12/03/23 1348  SpO2 96 % 12/03/23 1348  Vitals shown include unfiled device data.  Last Pain:  Vitals:   12/03/23 1345  TempSrc:   PainSc: 0-No pain         Complications: No notable events documented.

## 2023-12-04 ENCOUNTER — Encounter: Payer: Self-pay | Admitting: Obstetrics and Gynecology

## 2023-12-04 NOTE — Anesthesia Postprocedure Evaluation (Signed)
 Anesthesia Post Note  Patient: Hannah Ball  Procedure(s) Performed: LAPAROSCOPIC SUPRACERVICAL HYSTERECTOMY WITH BILATERAL SALPINGECTOMY (Bilateral)  Patient location during evaluation: PACU Anesthesia Type: General Level of consciousness: awake and alert Pain management: pain level controlled Vital Signs Assessment: post-procedure vital signs reviewed and stable Respiratory status: spontaneous breathing, nonlabored ventilation, respiratory function stable and patient connected to nasal cannula oxygen Cardiovascular status: blood pressure returned to baseline and stable Postop Assessment: no apparent nausea or vomiting Anesthetic complications: no   No notable events documented.   Last Vitals:  Vitals:   12/03/23 1500 12/03/23 1726  BP: 97/61 102/69  Pulse: 64 63  Resp: 18 17  Temp: 36.8 C (!) 36.3 C  SpO2: 100% 100%    Last Pain:  Vitals:   12/03/23 1726  TempSrc:   PainSc: 9                  Prentice Murphy

## 2023-12-06 LAB — SURGICAL PATHOLOGY

## 2023-12-07 ENCOUNTER — Encounter: Payer: Self-pay | Admitting: Obstetrics and Gynecology

## 2023-12-07 NOTE — Progress Notes (Signed)
    OBSTETRICS/GYNECOLOGY POST-OPERATIVE CLINIC VISIT  Subjective:     Hannah Ball is a 33 y.o. female who presents to the clinic 1 weeks status post LAPAROSCOPIC SUPRACERVICAL HYSTERECTOMY WITH BILATERAL SALPINGECTOMY  for  Menorrhagia, Dysmenorrhea . Eating a regular diet  : has loss of appetite . Bowel movements are normal. Pain is controlled with current analgesics. Medications being used: ibuprofen (OTC).  States that she had concerns about one of her incisions opening up a day after leaving the hospital, accompanied by some light bleeding and drainage. This has stopped. Also reports some bruising and tenderness at same incision site.   The following portions of the patient's history were reviewed and updated as appropriate: allergies, current medications, past family history, past medical history, past social history, past surgical history, and problem list.  Review of Systems Pertinent items are noted in HPI.   Objective:   BP 108/74   Pulse 100   Resp 16   Ht 5\' 7"  (1.702 m)   Wt 217 lb (98.4 kg)   LMP 11/15/2023 (Exact Date)   BMI 33.99 kg/m  There is no height or weight on file to calculate BMI.  General:  alert and no distress  Abdomen: soft, bowel sounds active, non-tender  Incision:   healing well, no drainage, no erythema, no hernia, no seroma, no swelling, no dehiscence, incision well approximated. Left abdominal incision site with mild bruising.     Pathology:   MRN: [id number] Physician: Hildred Laser DOB/Age Feb 01, 1991 (Age: 89) Gender: F Collected Date: 12/03/2023 Received Date: 12/04/2023  FINAL DIAGNOSIS       1. Uterus and  bilateral fallopian tubes,  :      - SECRETORY TYPE ENDOMETRIUM      - MYOMETRIUM WITH FOCALLY PROMINENT VESSELS      - BILATERAL FALLOPIAN TUBES WITH FIMBRIAE (STATUS POST TUBAL LIGATION)      - NEGATIVE FOR HYPERPLASIA OR MALIGNANCY       2. Pelvis, biopsy, Right side wall :      - FIBROUS TISSUE WITH CAUTERY ARTIFACT,   FOCAL ENDOMETRIAL STROMA AND ASSOCIATED      FEATURES COMPATIBLE WITH ENDOMETRIOSIS  ELECTRONIC SIGNATURE : Kanteti M.D., Dossie Arbour., Pathologist, Electronic Signature   Assessment:   Patient s/p LAPAROSCOPIC SUPRACERVICAL HYSTERECTOMY WITH BILATERAL SALPINGECTOMY  (surgery)  Doing well postoperatively. Endometriosis diagnosed on laparoscopy  Plan:   1. Continue any current medications as instructed by provider.  2. Wound care discussed. All incision sites overall healing well.  Can use Icy Hot or lidocaine gel to tender incision.  3. Operative findings again reviewed. Pathology report discussed.  Reviewed new diagnosis of endometriosis. Discussed that she most likely may not have any residual symptoms after hysterectomy, but also given education on signs to look for (continued pelvic pain/dyspareunia, cyclical bleeding from cervix post-hysterectomy).  4. Activity restrictions: no bending, stooping, or squatting and no lifting more than 15 pounds, pelvic rest 5. Anticipated return to work:  5 weeks . 6. Follow up: 4 weeks for final post op.    Hildred Laser, MD [facility]

## 2023-12-11 ENCOUNTER — Ambulatory Visit (INDEPENDENT_AMBULATORY_CARE_PROVIDER_SITE_OTHER): Payer: Medicaid Other | Admitting: Obstetrics and Gynecology

## 2023-12-11 ENCOUNTER — Encounter: Payer: Self-pay | Admitting: Obstetrics and Gynecology

## 2023-12-11 VITALS — BP 108/74 | HR 100 | Resp 16 | Ht 67.0 in | Wt 217.0 lb

## 2023-12-11 DIAGNOSIS — Z90711 Acquired absence of uterus with remaining cervical stump: Secondary | ICD-10-CM | POA: Insufficient documentation

## 2023-12-11 DIAGNOSIS — N809 Endometriosis, unspecified: Secondary | ICD-10-CM

## 2023-12-11 DIAGNOSIS — Z4889 Encounter for other specified surgical aftercare: Secondary | ICD-10-CM

## 2023-12-11 DIAGNOSIS — E66811 Obesity, class 1: Secondary | ICD-10-CM | POA: Insufficient documentation

## 2023-12-11 HISTORY — DX: Endometriosis, unspecified: N80.9

## 2024-01-02 ENCOUNTER — Encounter: Payer: Self-pay | Admitting: Podiatry

## 2024-01-02 ENCOUNTER — Ambulatory Visit (INDEPENDENT_AMBULATORY_CARE_PROVIDER_SITE_OTHER)

## 2024-01-02 ENCOUNTER — Ambulatory Visit: Payer: Medicaid Other | Admitting: Podiatry

## 2024-01-02 DIAGNOSIS — M21621 Bunionette of right foot: Secondary | ICD-10-CM

## 2024-01-02 DIAGNOSIS — M21622 Bunionette of left foot: Secondary | ICD-10-CM | POA: Diagnosis not present

## 2024-01-02 DIAGNOSIS — M778 Other enthesopathies, not elsewhere classified: Secondary | ICD-10-CM

## 2024-01-02 NOTE — Progress Notes (Signed)
 Subjective:  Patient ID: Hannah Ball, female    DOB: 1990-11-15,  MRN: [id number] HPI Chief Complaint  Patient presents with   Foot Pain    5th MPJ bilateral (R>L) - callused, tender walking x couple months, also thick, callused skin plantar/lateral right, tried soaking feet-no help   New Patient (Initial Visit)    33 y.o. female presents with the above complaint.   ROS: Denies fever chills nausea vomit muscle aches pains calf pain back pain chest pain shortness of breath.  Past Medical History:  Diagnosis Date   COVID-19 12/07/2019   +12/05/19   Endometriosis determined by laparoscopy 12/11/2023   Migraines    Ovarian cyst    Past Surgical History:  Procedure Laterality Date   LAPAROSCOPIC OVARIAN CYSTECTOMY     LAPAROSCOPIC SUPRACERVICAL HYSTERECTOMY Bilateral 12/03/2023   Procedure: LAPAROSCOPIC SUPRACERVICAL HYSTERECTOMY WITH BILATERAL SALPINGECTOMY;  Surgeon: Hildred Laser, MD;  Location: [facility] ORS;  Service: Gynecology;  Laterality: Bilateral;   OVARIAN CYST REMOVAL  2008   TUBAL LIGATION N/A 08/24/2022   Procedure: POST PARTUM TUBAL LIGATION;  Surgeon: Hildred Laser, MD;  Location: [facility] ORS;  Service: Gynecology;  Laterality: N/A;    Current Outpatient Medications:    ibuprofen (ADVIL) 800 MG tablet, Take 1 tablet (800 mg total) by mouth every 8 (eight) hours as needed., Disp: 60 tablet, Rfl: 1  Allergies  Allergen Reactions   Benadryl [Diphenhydramine Hcl (Sleep)] Hives   Latex Hives   Neo-Bacit-Poly-Lidocaine Other (See Comments)    Skin burns   Neosporin Wound Cleanser [Benzalkonium Chloride] Hives   Penicillins Rash    Has patient had a PCN reaction causing immediate rash, facial/tongue/throat swelling, SOB or lightheadedness with hypotension: Yes Has patient had a PCN reaction causing severe rash involving mucus membranes or skin necrosis: No Has patient had a PCN reaction that required hospitalization No Has patient had a PCN reaction occurring  within the last 10 years: No If all of the above answers are "NO", then may proceed with Cephalosporin use.    Review of Systems Objective:  There were no vitals filed for this visit.  General: Well developed, nourished, in no acute distress, alert and oriented x3   Dermatological: Skin is warm, dry and supple bilateral. Nails x 10 are well maintained; remaining integument appears unremarkable at this time. There are no open sores, no preulcerative lesions, no rash or signs of infection present.  Hyperkeratotic benign lesion and fissured area plantar aspect of the fifth metatarsal head right and middle diaphyseal region of the fifth metatarsal right.  Palpable bursa is noted with benign skin lesion.  Vascular: Dorsalis Pedis artery and Posterior Tibial artery pedal pulses are 2/4 bilateral with immedate capillary fill time. Pedal hair growth present. No varicosities and no lower extremity edema present bilateral.   Neruologic: Grossly intact via light touch bilateral. Vibratory intact via tuning fork bilateral. Protective threshold with Semmes Wienstein monofilament intact to all pedal sites bilateral. Patellar and Achilles deep tendon reflexes 2+ bilateral. No Babinski or clonus noted bilateral.   Musculoskeletal: No gross boney pedal deformities bilateral. No pain, crepitus, or limitation noted with foot and ankle range of motion bilateral. Muscular strength 5/5 in all groups tested bilateral.  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    Gait: Unassisted, Nonantalgic.    Radiographs:  Radiographs taken today demonstrate osseously mature individual with good bone mineralization she has noted high increase in the fourth intermetatarsal angle.  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  Plan: Discussed etiology pathology conservative  surgical therapies discussed the need for surgical intervention today and will follow-up with her at the time of surgery.  She will talk about this with her husband and they will follow-up with [place] for a consult.  Otherwise she will utilize appropriate shoe gear which we discussed as well as O'Keefe's foot cream and Voltaren gel.     Hannah Ball, [place]

## 2024-01-08 ENCOUNTER — Telehealth: Payer: Medicaid Other | Admitting: Obstetrics and Gynecology

## 2024-01-09 NOTE — Progress Notes (Unsigned)
    OBSTETRICS/GYNECOLOGY POST-OPERATIVE CLINIC VISIT  Subjective:     Hannah Ball is a 33 y.o. female who presents to the clinic 5 weeks status post LAPAROSCOPIC SUPRACERVICAL HYSTERECTOMY WITH BILATERAL SALPINGECTOMY  for Menorrhagia, Dysmenorrhea . Eating a regular diet {with-without:5700} difficulty. Bowel movements are {normal/abnormal***:19619}. {pain control:13522::"The patient is not having any pain."}  {Common ambulatory SmartLinks:19316}  Review of Systems {ros; complete:30496}   Objective:   There were no vitals taken for this visit. There is no height or weight on file to calculate BMI.  General:  alert and no distress  Abdomen: soft, bowel sounds active, non-tender  Incision:   {incision:13716::"no dehiscence","incision well approximated","healing well","no drainage","no erythema","no hernia","no seroma","no swelling"}    Pathology:  MRN: [id number] Physician: Hildred Laser DOB/Age 17-Mar-1991 (Age: 57) Gender: F Collected Date: 12/03/2023 Received Date: 12/04/2023  FINAL DIAGNOSIS       1. Uterus and  bilateral fallopian tubes,  :      - SECRETORY TYPE ENDOMETRIUM      - MYOMETRIUM WITH FOCALLY PROMINENT VESSELS      - BILATERAL FALLOPIAN TUBES WITH FIMBRIAE (STATUS POST TUBAL LIGATION)      - NEGATIVE FOR HYPERPLASIA OR MALIGNANCY       2. Pelvis, biopsy, Right side wall :      - FIBROUS TISSUE WITH CAUTERY ARTIFACT,  FOCAL ENDOMETRIAL STROMA AND ASSOCIATED      FEATURES COMPATIBLE WITH ENDOMETRIOSIS  ELECTRONIC SIGNATURE : Kanteti M.D., Dossie Arbour., Pathologist, Electronic Signature  MICROSCOPIC DESCRIPTION  CASE COMMENTS STAINS USED IN DIAGNOSIS: H&E H&E H&E H&E H&E H&E H&E  CLINICAL HISTORY  SPECIMEN(S) OBTAINED 1. Uterus and  bilateral fallopian tubes, 2. Pelvis, biopsy, Right Side Wall  SPECIMEN COMMENTS: SPECIMEN CLINICAL INFORMATION: 1. Menorrhagia, dysmenorrhea  Gross Description 1. Specimen: Supracervical hysterectomy and  bilateral salpingectomy, recevied fresh and placed in formalin; labeled "uterus with bilateral fallopian tubes".      Specimen integrity (intact/incised/disrupted): Disrupted, in multiple fragments.      Size and shape: 11.0 x 8.5 x 3.5 cm (in aggregate).      Weight: 95.3 g      Serosa: Minimal tan-pink, glistening serosa is present.      Endometrium: 0.2 cm thick, tan-red and roughened.      Myometrium: Tan-white and firm with dilated spaces and surrounding pallor up to      0.3 cm in diameter.      Fallopian tubes: 6.0 and 8.6 cm long, each 0.5 cm in diameter; grossly      unremarkable cut surfaces.      Block Summary:      1A-1C: Endomyometrium with serosa      1D: Myometrium with dilated spaces      1E-22F: Bisected fimbria and cross sections of fallopian tube, submitted      individually 2. "Right pelvic sidewall", received fresh and placed in formalin is a 0.7 x 0.2 x 0.2 cm tan-gray soft tissue fragment submitted entirely in block 2A.      SMB      12/04/23   Assessment:   Patient s/p LAPAROSCOPIC SUPRACERVICAL HYSTERECTOMY WITH BILATERAL SALPINGECTOMY  (surgery)  {doing well:13525::"Doing well postoperatively."}   Plan:   1. Continue any current medications as instructed by provider. 2. Wound care discussed. 3. Operative findings again reviewed. Pathology report discussed. 4. Activity restrictions: {restrictions:13723} 5. Anticipated return to work: {work return:14002}. 6. Follow up: {4-09:81191} {time; units:18646} for ***    Hildred Laser, MD [facility]

## 2024-01-10 ENCOUNTER — Ambulatory Visit (INDEPENDENT_AMBULATORY_CARE_PROVIDER_SITE_OTHER): Admitting: Obstetrics and Gynecology

## 2024-01-10 ENCOUNTER — Encounter: Payer: Self-pay | Admitting: Obstetrics and Gynecology

## 2024-01-10 VITALS — BP 105/61 | HR 90 | Ht 67.0 in | Wt 220.8 lb

## 2024-01-10 DIAGNOSIS — Z90711 Acquired absence of uterus with remaining cervical stump: Secondary | ICD-10-CM

## 2024-01-10 DIAGNOSIS — Z4889 Encounter for other specified surgical aftercare: Secondary | ICD-10-CM

## 2024-01-10 DIAGNOSIS — N809 Endometriosis, unspecified: Secondary | ICD-10-CM

## 2024-03-18 ENCOUNTER — Ambulatory Visit (INDEPENDENT_AMBULATORY_CARE_PROVIDER_SITE_OTHER): Admitting: Certified Nurse Midwife

## 2024-03-18 ENCOUNTER — Encounter: Payer: Self-pay | Admitting: Certified Nurse Midwife

## 2024-03-18 VITALS — BP 108/71 | HR 91 | Ht 67.0 in | Wt 223.7 lb

## 2024-03-18 DIAGNOSIS — E669 Obesity, unspecified: Secondary | ICD-10-CM | POA: Diagnosis not present

## 2024-03-18 DIAGNOSIS — Z6835 Body mass index (BMI) 35.0-35.9, adult: Secondary | ICD-10-CM | POA: Diagnosis not present

## 2024-03-18 DIAGNOSIS — Z7689 Persons encountering health services in other specified circumstances: Secondary | ICD-10-CM

## 2024-03-18 MED ORDER — CYANOCOBALAMIN 1000 MCG/ML IJ SOLN
1000.0000 ug | Freq: Once | INTRAMUSCULAR | Status: AC
  Administered 2024-03-18: 1000 ug via INTRAMUSCULAR

## 2024-03-18 MED ORDER — PHENTERMINE HCL 37.5 MG PO TABS
37.5000 mg | ORAL_TABLET | Freq: Every day | ORAL | 0 refills | Status: DC
Start: 2024-03-18 — End: 2024-04-18

## 2024-03-18 MED ORDER — CYANOCOBALAMIN 1000 MCG/ML IJ SOLN: 1000.0000 ug | Freq: Once | INTRAMUSCULAR | 5 refills | Status: AC

## 2024-03-18 NOTE — Progress Notes (Signed)
 Subjective:  Hannah Ball is a 33 y.o. G5P4004  being seen today for weight loss management- initial visit. Goal wt 170-175 lbs.  Patient reports General ROS: negative  Onset was gradual over the past several years with chid birth and poor dietary/exercise choices.  Associated symptoms include: fatigue, depression, anxiety,  headaches, change in clothing fit. Previous attempts with diet and exercise change.  Pertinent medical history includes: none  Risk factors include:busy lifestyle   The patient has a surgical history of:  hysterectomy.  Pertinent social history includes: none Past evaluation has included: CMP. Todays labs: TSH, Lipid, HemA1c   The following portions of the patient's history were reviewed and updated as appropriate: allergies, current medications, past family history, past medical history, past social history, past surgical history and problem list.   Objective:   Vitals:   03/18/24 0859  BP: 108/71  Pulse: 91  Weight: 223 lb 11.2 oz (101.5 kg)  Height: 5\' 7"  (1.702 m)    General:  Alert, oriented and cooperative. Patient is in no acute distress.  PE: Well groomed female in no current distress,   Mental Status: Normal mood and affect. Normal behavior. Normal judgment and thought content.   Current BMI: Body mass index is 35.04 kg/m.   Assessment and Plan:  Obesity  1. Encounter for weight management (Primary)   Plan: low carb, High protein diet RX for adipex 37.5 mg daily and B12 1000mcg.ml monthly, to start now with first injection given at today's visit. Reviewed side-effects common to both medications and expected outcomes. Increase daily water intake to at least 8 bottle a day, every day.  Goal is to reduse weight by 5% by end of three months, and will re-evaluate then.  RTC in 4 weeks for Nurse visit to check weight & BP, and get next B12 injections.    Please refer to After Visit Summary for other counseling recommendations.     Hannah Ball, CNM       Consider the Low Glycemic Index Diet and 6 smaller meals daily .  This boosts your metabolism and regulates your sugars:   Use the protein bar by [employer]kins because they have lots of fiber in them  Find the low carb flatbreads, tortillas and pita breads for sandwiches:  Joseph's makes a pita bread and a flat bread , available at [facility] and [employer]; Toufayah makes a low carb flatbread available at [employer] and HT that is 9 net carbs and 100 cal [place] makes a low carb whole wheat tortilla available at [employer] most grocery stores with 6 net carbs and 210 cal  [place] yogurt can still have a lot of carbs .  Dannon Light N fit has 80 cal and 8 carbs

## 2024-03-19 LAB — LIPID PANEL
Chol/HDL Ratio: 2.8 ratio (ref 0.0–4.4)
Cholesterol, Total: 104 mg/dL (ref 100–199)
HDL: 37 mg/dL — ABNORMAL LOW (ref >39)
LDL Chol Calc (NIH): 49 mg/dL (ref 0–99)
Triglycerides: 94 mg/dL (ref 0–149)
VLDL Cholesterol Cal: 18 mg/dL (ref 5–40)

## 2024-03-19 LAB — TSH+FREE T4
Free T4: 1.1 ng/dL (ref 0.82–1.77)
TSH: 2.05 u[IU]/mL (ref 0.450–4.500)

## 2024-03-19 LAB — HEMOGLOBIN A1C
Est. average glucose Bld gHb Est-mCnc: 100 mg/dL
Hgb A1c MFr Bld: 5.1 % (ref 4.8–5.6)

## 2024-04-18 ENCOUNTER — Ambulatory Visit: Admitting: Certified Nurse Midwife

## 2024-04-18 ENCOUNTER — Encounter: Payer: Self-pay | Admitting: Certified Nurse Midwife

## 2024-04-18 VITALS — BP 109/71 | HR 62 | Ht 67.0 in | Wt 214.0 lb

## 2024-04-18 DIAGNOSIS — Z6833 Body mass index (BMI) 33.0-33.9, adult: Secondary | ICD-10-CM | POA: Diagnosis not present

## 2024-04-18 DIAGNOSIS — Z7689 Persons encountering health services in other specified circumstances: Secondary | ICD-10-CM

## 2024-04-18 DIAGNOSIS — E669 Obesity, unspecified: Secondary | ICD-10-CM | POA: Diagnosis not present

## 2024-04-18 MED ORDER — PHENTERMINE HCL 37.5 MG PO TABS: 37.5000 mg | ORAL_TABLET | Freq: Every day | ORAL | 0 refills | Status: DC

## 2024-04-18 MED ORDER — CYANOCOBALAMIN 1000 MCG/ML IJ SOLN
1000.0000 ug | Freq: Once | INTRAMUSCULAR | Status: AC
  Administered 2024-04-18: 1000 ug via INTRAMUSCULAR

## 2024-04-18 NOTE — Progress Notes (Signed)
 SUBJECTIVE:  33 y.o. here for follow-up weight loss visit, previously seen 4 weeks ago. Denies any concerns and feels like medication is working well. She has lost 9 lbs this past month. She denies any side effect from medication. She has increased her walking and is now walking on the weekends.   OBJECTIVE:  BP 109/71   Pulse 62   Ht 5' 7 (1.702 m)   Wt 214 lb (97.1 kg)   LMP 11/15/2023 (Exact Date)   BMI 33.52 kg/m   Body mass index is 33.52 kg/m.  Waist: 43.5 in  Patient appears well. ASSESSMENT:  Obesity- responding well to weight loss plan PLAN:  To continue with current medications. B12 1000mcg/ml injection given RTC in 4 weeks as planned  Hannah Ball, CNM

## 2024-05-12 ENCOUNTER — Encounter: Payer: Self-pay | Admitting: Certified Nurse Midwife

## 2024-05-13 ENCOUNTER — Ambulatory Visit

## 2024-05-13 VITALS — BP 110/78 | HR 85 | Ht 67.0 in | Wt 208.4 lb

## 2024-05-13 DIAGNOSIS — R399 Unspecified symptoms and signs involving the genitourinary system: Secondary | ICD-10-CM | POA: Diagnosis not present

## 2024-05-13 LAB — POCT URINALYSIS DIPSTICK
Appearance: NORMAL
Bilirubin, UA: NEGATIVE
Glucose, UA: NEGATIVE
Ketones, UA: NEGATIVE
Nitrite, UA: NEGATIVE
Odor: NORMAL
Protein, UA: NEGATIVE
Spec Grav, UA: 1.02 (ref 1.010–1.025)
Urobilinogen, UA: 0.2 U/dL
pH, UA: 5 (ref 5.0–8.0)

## 2024-05-13 MED ORDER — SULFAMETHOXAZOLE-TRIMETHOPRIM 800-160 MG PO TABS: 1.0000 | ORAL_TABLET | Freq: Two times a day (BID) | ORAL | 0 refills | Status: AC

## 2024-05-13 NOTE — Progress Notes (Signed)
    NURSE VISIT NOTE  Subjective:    Patient ID: Hannah Ball, female    DOB: [birth year], 33 y.o.   MRN: [id number]       HPI  Patient is a 33 y.o. G32P4004 female who presents for urinary frequency, urinary urgency, flank pain, and pelvic pain for 4 days.  Patient denies hematuria, cloudy malordorous urine, genital rash, genital irritation, and vaginal discharge.  Patient does not have a history of recurrent UTI.  Patient does not have a history of pyelonephritis.    Objective:    BP 110/78   Pulse 85   Ht 5' 7 (1.702 m)   Wt 208 lb 6.4 oz (94.5 kg)   LMP 11/15/2023 (Exact Date)   BMI 32.64 kg/m    Lab Review  Results for orders placed or performed in visit on 05/13/24  POCT Urinalysis Dipstick  Result Value Ref Range   Color, UA Amber    Clarity, UA clear    Glucose, UA Negative Negative   Bilirubin, UA Negative    Ketones, UA Negative    Spec Grav, UA 1.020 1.010 - 1.025   Blood, UA Moderate    pH, UA 5.0 5.0 - 8.0   Protein, UA Negative Negative   Urobilinogen, UA 0.2 0.2 or 1.0 E.U./dL   Nitrite, UA Negative    Leukocytes, UA Small (1+) (A) Negative   Appearance normal    Odor normal     Assessment:   1. UTI symptoms      Plan:   Urine Culture Sent. Treatment  Bactrim  DS 1 PO BID for 7 days. Maintain adequate hydration.  May use AZO OTC prn.  Follow up if symptoms worsen or fail to improve as anticipated, and as needed.    Camelia Bars, LPN

## 2024-05-15 ENCOUNTER — Ambulatory Visit: Payer: Self-pay | Admitting: Licensed Practical Nurse

## 2024-05-15 LAB — URINE CULTURE

## 2024-05-21 ENCOUNTER — Ambulatory Visit

## 2024-05-21 VITALS — BP 106/71 | HR 76 | Ht 68.0 in | Wt 209.4 lb

## 2024-05-21 DIAGNOSIS — R3915 Urgency of urination: Secondary | ICD-10-CM

## 2024-05-21 DIAGNOSIS — R109 Unspecified abdominal pain: Secondary | ICD-10-CM

## 2024-05-21 LAB — POCT URINALYSIS DIPSTICK
Bilirubin, UA: NEGATIVE
Glucose, UA: NEGATIVE
Ketones, UA: NEGATIVE
Nitrite, UA: NEGATIVE
Protein, UA: NEGATIVE
Spec Grav, UA: 1.025 (ref 1.010–1.025)
Urobilinogen, UA: 0.2 U/dL
pH, UA: 6 (ref 5.0–8.0)

## 2024-05-21 MED ORDER — NITROFURANTOIN MONOHYD MACRO 100 MG PO CAPS: 100.0000 mg | ORAL_CAPSULE | Freq: Two times a day (BID) | ORAL | 1 refills | Status: DC

## 2024-05-21 MED ORDER — SULFAMETHOXAZOLE-TRIMETHOPRIM 800-160 MG PO TABS: 1.0000 | ORAL_TABLET | Freq: Two times a day (BID) | ORAL | 0 refills | Status: DC

## 2024-05-21 NOTE — Progress Notes (Signed)
    NURSE VISIT NOTE  Subjective:    Patient ID: Hannah Ball, female    DOB: Sep 14, 1991, 33 y.o.   MRN: [id number]       HPI  Patient is a 33 y.o. G2P4004 female who presents for urinary frequency, urinary urgency, flank pain, pelvic pain, and cloudy malordorous urine for 12 days.  Patient denies urinary frequency, urinary urgency, flank pain, cloudy malordorous urine, and genital irritation.  Patient does not have a history of recurrent UTI.  Patient does not have a history of pyelonephritis.    Objective:    BP 106/71   Pulse 76   Ht 5' 8 (1.727 m)   Wt 209 lb 6.4 oz (95 kg)   LMP 11/15/2023 (Exact Date)   BMI 31.84 kg/m    Lab Review  Results for orders placed or performed in visit on 05/21/24  POCT urinalysis dipstick  Result Value Ref Range   Color, UA yellow    Clarity, UA cloudy    Glucose, UA Negative Negative   Bilirubin, UA negative    Ketones, UA negative    Spec Grav, UA 1.025 1.010 - 1.025   Blood, UA large    pH, UA 6.0 5.0 - 8.0   Protein, UA Negative Negative   Urobilinogen, UA 0.2 0.2 or 1.0 E.U./dL   Nitrite, UA negative    Leukocytes, UA Moderate (2+) (A) Negative   Appearance     Odor      Assessment:   1. Urinary urgency   2. Flank pain      Plan:   Urine Culture Sent. Treatment  Bactrim  DS 1 PO BID for 7 days. Maintain adequate hydration.  Follow up if symptoms worsen or fail to improve as anticipated, and as needed.    Rollo JINNY Maxin, CMA

## 2024-05-21 NOTE — Patient Instructions (Addendum)
 Urinary Tract Infection, Female A urinary tract infection (UTI) is an infection in your urinary tract. The urinary tract is made up of organs that make, store, and get rid of pee (urine) in your body. These organs include: The kidneys. The ureters. The bladder. The urethra. What are the causes? Most UTIs are caused by germs called bacteria. They may be in or near your genitals. These germs grow and cause swelling in your urinary tract. What increases the risk? You're more likely to get a UTI if: You're a female. The urethra is shorter in females than in males. You have a soft tube called a catheter that drains your pee. You can't control when you pee or poop. You have trouble peeing because of: A kidney stone. A urinary blockage. A nerve condition that affects your bladder. Not getting enough to drink. You're sexually active. You use a birth control inside your vagina, like spermicide. You're pregnant. You have low levels of the hormone estrogen in your body. You're an older adult. You're also more likely to get a UTI if you have other health problems. These may include: Diabetes. A weak immune system. Your immune system is your body's defense system. Sickle cell disease. Injury of the spine. What are the signs or symptoms? Symptoms may include: Needing to pee right away. Peeing small amounts often. Pain or burning when you pee. Blood in your pee. Pee that smells bad or odd. Pain in your belly or lower back. You may also: Feel confused. This may be the first symptom in older adults. Vomit. Not feel hungry. Feel tired or easily annoyed. Have a fever or chills. How is this diagnosed? A UTI is diagnosed based on your medical history and an exam. You may also have other tests. These may include: Pee tests. Blood tests. Tests for sexually transmitted infections (STIs). If you've had more than one UTI, you may need to have imaging studies done to find out why you keep getting  them. How is this treated? A UTI can be treated by: Taking antibiotics or other medicines. Drinking enough fluid to keep your pee pale yellow. In rare cases, a UTI can cause a very bad condition called sepsis. Sepsis may be treated in the hospital. Follow these instructions at home: Medicines Take your medicines only as told by your health care provider. If you were given antibiotics, take them as told by your provider. Do not stop taking them even if you start to feel better. General instructions Make sure you: Pee often and fully. Do not hold your pee for a long time. Wipe from front to back after you pee or poop. Use each tissue only once when you wipe. Pee after you have sex. Do not douche or use sprays or powders in your genital area. Contact a health care provider if: Your symptoms don't get better after 1-2 days of taking antibiotics. Your symptoms go away and then come back. You have a fever or chills. You vomit or feel like you may vomit. Get help right away if: You have very bad pain in your back or lower belly. You faint. This information is not intended to replace advice given to you by your health care provider. Make sure you discuss any questions you have with your health care provider. Document Revised: 09/26/2023 Document Reviewed: 01/19/2023 Elsevier Patient Education  2025 Elsevier Inc.  Nitrofurantoin  Capsules or Tablets What is this medication? NITROFURANTOIN  (nye troe fyoor AN toyn) treats urinary tract infections caused by bacteria. It  belongs to a group of medications called antibiotics. It will not treat colds, the flu, or infections caused by viruses. This medicine may be used for other purposes; ask your health care provider or pharmacist if you have questions. COMMON BRAND NAME(S): Macrobid , Macrodantin , Urotoin What should I tell my care team before I take this medication? They need to know if you have any of these  conditions: Anemia Diabetes Glucose-6-phosphate dehydrogenase (G6PD) deficiency Kidney disease Liver disease Lung disease Other chronic illness An unusual or allergic reaction to nitrofurantoin , other medications, foods, dyes, or preservatives Pregnant or trying to get pregnant Breastfeeding How should I use this medication? Take this medication by mouth with a glass of water. Follow the directions on the prescription label. Take this medication with food or milk. Take your doses at regular intervals. Do not take your medication more often than directed. Do not stop taking except on your care team's advice. Talk to your care team about the use of this medication in children. While this medication may be prescribed for selected conditions, precautions do apply. Overdosage: If you think you have taken too much of this medicine contact a poison control center or emergency room at once. NOTE: This medicine is only for you. Do not share this medicine with others. What if I miss a dose? If you miss a dose, take it as soon as you can. If it is almost time for your next dose, take only that dose. Do not take double or extra doses. What may interact with this medication? Antacids containing magnesium trisilicate Probenecid Quinolone antibiotics, such as ciprofloxacin, lomefloxacin, norfloxacin, and ofloxacin Sulfinpyrazone This list may not describe all possible interactions. Give your health care provider a list of all the medicines, herbs, non-prescription drugs, or dietary supplements you use. Also tell them if you smoke, drink alcohol, or use illegal drugs. Some items may interact with your medicine. What should I watch for while using this medication? Tell your care team if your symptoms do not improve or if you get new symptoms. Drink several glasses of water a day. If you are taking this medication for a long time, visit your care team for regular checks on your progress. If you are diabetic,  you may get a false positive result for sugar in your urine with certain brands of urine tests. Check with your care team. What side effects may I notice from receiving this medication? Side effects that you should report to your care team as soon as possible: Allergic reactions--skin rash, itching, hives, swelling of the face, lips, tongue, or throat Dry cough, shortness of breath or trouble breathing Liver injury--right upper belly pain, loss of appetite, nausea, light-colored stool, dark yellow or brown urine, yellowing skin or eyes, unusual weakness or fatigue Low red blood cell count--unusual weakness or fatigue, dizziness, headache, trouble breathing Pain, tingling, or numbness in the hands or feet Redness, blistering, peeling, or loosening of the skin, including inside the mouth Severe diarrhea, fever Sudden eye pain or change in vision such as blurry vision, seeing halos around lights, vision loss Unusual vaginal discharge, itching, or odor Side effects that usually do not require medical attention (report to your care team if they continue or are bothersome): Loss of appetite Nausea Vomiting This list may not describe all possible side effects. Call your doctor for medical advice about side effects. You may report side effects to FDA at 1-800-FDA-1088. Where should I keep my medication? Keep out of the reach of children. Store at  room temperature between 15 and 30 degrees C (59 and 86 degrees F). Protect from light. Throw away any unused medication after the expiration date. NOTE: This sheet is a summary. It may not cover all possible information. If you have questions about this medicine, talk to your doctor, pharmacist, or health care provider.  2024 Elsevier/Gold Standard (2022-10-16 00:00:00)

## 2024-05-21 NOTE — Progress Notes (Deleted)
    NURSE VISIT NOTE  Subjective:    Patient ID: Hannah Ball, female    DOB: 05/06/1991, 33 y.o.   MRN: [id number]  HPI  Patient is a 33 y.o. G90P4004 female who presents for {pe vag discharge desc:315065} vaginal discharge for *** {gen duration:315003}. Denies abnormal vaginal bleeding or significant pelvic pain or fever. {Actions; denies/reports/admits to:19208} {UTI Symptoms:210800002}. Patient {has/denies:315300} history of known exposure to STD.   Objective:    LMP 11/15/2023 (Exact Date)    @THIS  VISIT ONLY@  Assessment:   No diagnosis found.  {vaginitis type:315262}  Plan:   GC and chlamydia DNA  probe sent to lab. Treatment: {vaginitis tx:315263} ROV prn if symptoms persist or worsen.   Rollo JINNY Maxin, CMA

## 2024-05-21 NOTE — Addendum Note (Signed)
 Addended by: DELANA SAUER on: 05/21/2024 01:44 PM   Modules accepted: Orders

## 2024-05-23 LAB — URINE CULTURE

## 2024-05-26 ENCOUNTER — Ambulatory Visit: Admitting: Certified Nurse Midwife

## 2024-05-26 VITALS — BP 103/73 | HR 96 | Wt 202.8 lb

## 2024-05-26 DIAGNOSIS — R82998 Other abnormal findings in urine: Secondary | ICD-10-CM | POA: Diagnosis not present

## 2024-05-26 DIAGNOSIS — E669 Obesity, unspecified: Secondary | ICD-10-CM | POA: Diagnosis not present

## 2024-05-26 DIAGNOSIS — R319 Hematuria, unspecified: Secondary | ICD-10-CM | POA: Insufficient documentation

## 2024-05-26 DIAGNOSIS — R634 Abnormal weight loss: Secondary | ICD-10-CM

## 2024-05-26 DIAGNOSIS — R309 Painful micturition, unspecified: Secondary | ICD-10-CM

## 2024-05-26 DIAGNOSIS — Z6830 Body mass index (BMI) 30.0-30.9, adult: Secondary | ICD-10-CM

## 2024-05-26 DIAGNOSIS — R399 Unspecified symptoms and signs involving the genitourinary system: Secondary | ICD-10-CM | POA: Diagnosis not present

## 2024-05-26 DIAGNOSIS — R310 Gross hematuria: Secondary | ICD-10-CM

## 2024-05-26 LAB — POCT URINALYSIS DIPSTICK
Glucose, UA: NEGATIVE
Nitrite, UA: NEGATIVE
Protein, UA: POSITIVE — AB
Spec Grav, UA: 1.025 (ref 1.010–1.025)
Urobilinogen, UA: 0.2 U/dL
pH, UA: 5 (ref 5.0–8.0)

## 2024-05-26 MED ORDER — PHENTERMINE HCL 37.5 MG PO TABS: 37.5000 mg | ORAL_TABLET | Freq: Every day | ORAL | 0 refills | Status: DC

## 2024-05-26 MED ORDER — CYANOCOBALAMIN 1000 MCG/ML IJ SOLN
1000.0000 ug | Freq: Once | INTRAMUSCULAR | Status: AC
  Administered 2024-05-26: 1000 ug via INTRAMUSCULAR

## 2024-05-26 NOTE — Patient Instructions (Signed)

## 2024-05-26 NOTE — Progress Notes (Deleted)
 Subjective:  Hannah Ball is a 33 y.o. G5P4004 at Unknown being seen today for weight loss management- initial visit.  Patient reports General ROS: {rosgen:310653} and reports previous weight loss attempts: Management changes made at the last visit include:  adding medication, stopping medication, changing medication dose and ordering test(s).  Onset was sudden/gradua,  months/year(s) ago.  Onset followed:  starting medication, change in living environment, change in affect, recent pregnancy, and mental status changes. Associated symptoms include: fatigue, depression, anxiety, greasy stools, abdominal pain, polydipsia, headaches, change in clothing fit and menstrual changes. Previous/Current treatment includes: small frequent feedings, nutritional supplement, vitamin supplement, gluten free diet, psychiatrist care, antidepressant, vitamin B-12 injections and appetite stimulant.  Pertinent medical history includes: chronic digestive disease, diabetes, eating disorder, anxiety and psychiatric illness.  Risk factors include: social isolation, poverty, alcoholism, illicit drug use, excessive exercise, poor dentition and new medication.  The patient has a surgical history of: thyroidectomy, gastrectomy, bariatric surgery, cholecystectomy and hysterectomy.  Pertinent social history includes: alcohol abuse, tobacco abuse and marijuana use. Past evaluation has included: metabolic profile, hemoglobin A1c, thyroid panel  and psychiatric evaluation.  Past treatment has included: small frequent feedings, nutritional supplement, vitamin supplement, social assistance, psychiatrist care, antidepressant, vitamin B-12 injections, appetite stimulant, exercise management and discontinuation of medication.  The following portions of the patient's history were reviewed and updated as appropriate: allergies, current medications, past family history, past medical history, past social history, past surgical history  and problem list.   Objective:  There were no vitals filed for this visit.  General:  Alert, oriented and cooperative. Patient is in no acute distress.  :   :   :   :   :   :   PE: Well groomed female in no current distress,   Mental Status: Normal mood and affect. Normal behavior. Normal judgment and thought content.   Current BMI: There is no height or weight on file to calculate BMI.   Assessment and Plan:  Obesity  There are no diagnoses linked to this encounter.  Plan: low carb, High protein diet RX for adipex 37.5 mg daily and B12 1000mcg.ml monthly, to start now with first injection given at today's visit. Reviewed side-effects common to both medications and expected outcomes. Increase daily water intake to at least 8 bottle a day, every day.  Goal is to reduse weight by 10% by end of three months, and will re-evaluate then.  RTC in 4 weeks for Nurse visit to check weight & BP, and get next B12 injections.    Please refer to After Visit Summary for other counseling recommendations.    Latash Nouri L, CMA   Melody LOISE Capers, CNM      Consider the Low Glycemic Index Diet and 6 smaller meals daily .  This boosts your metabolism and regulates your sugars:   Use the protein bar by [employer] because they have lots of fiber in them  Find the low carb flatbreads, tortillas and pita breads for sandwiches:  Joseph's makes a pita bread and a flat bread , available at [facility] and [employer]; Toufayah makes a low carb flatbread available at [employer] and HT that is 9 net carbs and 100 cal Mission makes a low carb whole wheat tortilla available at [employer] most grocery stores with 6 net carbs and 210 cal  [place] yogurt can still have a lot of carbs .  Dannon Light N fit has 80 cal and 8 carbs

## 2024-05-26 NOTE — Progress Notes (Signed)
 SUBJECTIVE:  33 y.o. here for follow-up weight loss visit, previously seen 4 weeks ago. Denies any concerns and feels like medication is working well. She denies any side effects but notes that has been dealing with UTI symptoms. She has lost 12 lbs since her last visit.   OBJECTIVE:  BP 103/73   Pulse 96   Wt 202 lb 12.8 oz (92 kg)   LMP 11/15/2023 (Exact Date)   BMI 30.84 kg/m   Body mass index is 30.84 kg/m.  Waist 39 in  Patient appears well. ASSESSMENT:  Obesity- responding well to weight loss plan PLAN:  To continue with current medications. B12 1000mcg/ml injection given  Will dip and culture urine again. Urine noted to be dark in color, she continues to have symptoms and dips trace blood today. She would like a referral to urology .   RTC in 4 weeks as planned  Zelda Hummer, CNM

## 2024-05-28 ENCOUNTER — Encounter: Payer: Self-pay | Admitting: Certified Nurse Midwife

## 2024-05-28 LAB — URINE CULTURE

## 2024-05-30 ENCOUNTER — Emergency Department

## 2024-05-30 ENCOUNTER — Emergency Department
Admission: EM | Admit: 2024-05-30 | Discharge: 2024-05-30 | Disposition: A | Discharge status: Home / Self Care | Attending: Emergency Medicine | Admitting: Emergency Medicine

## 2024-05-30 ENCOUNTER — Other Ambulatory Visit: Payer: Self-pay

## 2024-05-30 DIAGNOSIS — K429 Umbilical hernia without obstruction or gangrene: Secondary | ICD-10-CM | POA: Diagnosis not present

## 2024-05-30 DIAGNOSIS — R319 Hematuria, unspecified: Secondary | ICD-10-CM | POA: Diagnosis not present

## 2024-05-30 DIAGNOSIS — N2889 Other specified disorders of kidney and ureter: Secondary | ICD-10-CM | POA: Diagnosis not present

## 2024-05-30 DIAGNOSIS — R102 Pelvic and perineal pain: Secondary | ICD-10-CM | POA: Insufficient documentation

## 2024-05-30 DIAGNOSIS — N200 Calculus of kidney: Secondary | ICD-10-CM | POA: Diagnosis not present

## 2024-05-30 DIAGNOSIS — K8020 Calculus of gallbladder without cholecystitis without obstruction: Secondary | ICD-10-CM | POA: Diagnosis not present

## 2024-05-30 LAB — COMPREHENSIVE METABOLIC PANEL WITH GFR
ALT: 14 U/L (ref 0–44)
AST: 17 U/L (ref 15–41)
Albumin: 3.7 g/dL (ref 3.5–5.0)
Alkaline Phosphatase: 39 U/L (ref 38–126)
Anion gap: 8 (ref 5–15)
BUN: 12 mg/dL (ref 6–20)
CO2: 25 mmol/L (ref 22–32)
Calcium: 8.9 mg/dL (ref 8.9–10.3)
Chloride: 105 mmol/L (ref 98–111)
Creatinine, Ser: 0.77 mg/dL (ref 0.44–1.00)
GFR, Estimated: >60 mL/min (ref >60)
Glucose, Bld: 88 mg/dL (ref 70–99)
Potassium: 3.9 mmol/L (ref 3.5–5.1)
Sodium: 138 mmol/L (ref 135–145)
Total Bilirubin: 1.1 mg/dL (ref 0.0–1.2)
Total Protein: 6.6 g/dL (ref 6.5–8.1)

## 2024-05-30 LAB — CBC WITH DIFFERENTIAL/PLATELET
Abs Immature Granulocytes: 0 K/uL (ref 0.00–0.07)
Basophils Absolute: 0 K/uL (ref 0.0–0.1)
Basophils Relative: 0 %
Eosinophils Absolute: 0 K/uL (ref 0.0–0.5)
Eosinophils Relative: 1 %
HCT: 42.2 % (ref 36.0–46.0)
Hemoglobin: 13.3 g/dL (ref 12.0–15.0)
Immature Granulocytes: 0 %
Lymphocytes Relative: 22 %
Lymphs Abs: 1 K/uL (ref 0.7–4.0)
MCH: 24.1 pg — ABNORMAL LOW (ref 26.0–34.0)
MCHC: 31.5 g/dL (ref 30.0–36.0)
MCV: 76.4 fL — ABNORMAL LOW (ref 80.0–100.0)
Monocytes Absolute: 0.3 K/uL (ref 0.1–1.0)
Monocytes Relative: 6 %
Neutro Abs: 3.1 K/uL (ref 1.7–7.7)
Neutrophils Relative %: 71 %
Platelets: 144 K/uL — ABNORMAL LOW (ref 150–400)
RBC: 5.52 MIL/uL — ABNORMAL HIGH (ref 3.87–5.11)
RDW: 16.7 % — ABNORMAL HIGH (ref 11.5–15.5)
WBC: 4.4 K/uL (ref 4.0–10.5)
nRBC: 0 % (ref 0.0–0.2)

## 2024-05-30 LAB — URINALYSIS, ROUTINE W REFLEX MICROSCOPIC
Bilirubin Urine: NEGATIVE
Glucose, UA: NEGATIVE mg/dL
Ketones, ur: NEGATIVE mg/dL
Nitrite: NEGATIVE
Protein, ur: NEGATIVE mg/dL
Specific Gravity, Urine: 1.02 (ref 1.005–1.030)
pH: 6 (ref 5.0–8.0)

## 2024-05-30 LAB — WET PREP, GENITAL
Clue Cells Wet Prep HPF POC: NONE SEEN
Sperm: NONE SEEN
Trich, Wet Prep: NONE SEEN
WBC, Wet Prep HPF POC: >=10 — AB (ref <10)
Yeast Wet Prep HPF POC: NONE SEEN

## 2024-05-30 LAB — CHLAMYDIA/NGC RT PCR (ARMC ONLY)
Chlamydia Tr: NOT DETECTED
N gonorrhoeae: NOT DETECTED

## 2024-05-30 MED ORDER — OXYCODONE HCL 5 MG PO TABS
5.0000 mg | ORAL_TABLET | Freq: Three times a day (TID) | ORAL | 0 refills | Status: AC | PRN
Start: 2024-05-30 — End: 2025-05-30

## 2024-05-30 MED ORDER — ONDANSETRON 4 MG PO TBDP: 4.0000 mg | ORAL_TABLET | Freq: Three times a day (TID) | ORAL | 0 refills | Status: DC | PRN

## 2024-05-30 NOTE — ED Notes (Signed)
 Patient ambulating in the hallway, no acute distress.

## 2024-05-30 NOTE — ED Notes (Signed)
 Patient states she needs to leave by 1645 to pick up her kids from daycare. Dr. Willo informed.

## 2024-05-30 NOTE — ED Triage Notes (Signed)
 Patient states urinary urgency and pelvic pressure x 2 weeks; has had urine checked and was treated for UTI.

## 2024-05-30 NOTE — ED Provider Notes (Signed)
 [facility] Provider Note    Event Date/Time   First MD Initiated Contact with Patient 05/30/24 1107     (approximate)   History   Chief Complaint Pelvic Pain   HPI  Hannah Ball is a 33 y.o. female with past medical history of endometriosis, ovarian cyst, hysterectomy, and migraines who presents to the ED complaining of pelvic pain.  Patient reports that she has had about 2 weeks of increasing pelvic pain that she describes as a pressure.  Discomfort is worse when she goes to stand and she also reports some discomfort when urinating.  She has been following with her OB/GYN for this problem, has completed 2 rounds of antibiotics for UTI without significant relief.  She denies any fevers, flank pain, nausea, vomiting, or changes in her bowel movements.  She reports some mild vaginal discharge that is typical for her, has not noticed any bleeding.     Physical Exam   Triage Vital Signs: ED Triage Vitals  Encounter Vitals Group     BP 05/30/24 1102 107/80     Girls Systolic BP Percentile --      Girls Diastolic BP Percentile --      Boys Systolic BP Percentile --      Boys Diastolic BP Percentile --      Pulse Rate 05/30/24 1102 75     Resp 05/30/24 1102 17     Temp 05/30/24 1102 98 F (36.7 C)     Temp Source 05/30/24 1102 Oral     SpO2 05/30/24 1102 100 %     Weight --      Height --      Head Circumference --      Peak Flow --      Pain Score 05/30/24 1104 10     Pain Loc --      Pain Education --      Exclude from Growth Chart --     Most recent vital signs: Vitals:   05/30/24 1423 05/30/24 1619  BP: 99/69 106/70  Pulse: 61 63  Resp: 16 16  Temp:  97.7 F (36.5 C)  SpO2: 100% 100%    Constitutional: Alert and oriented. Eyes: Conjunctivae are normal. Head: Atraumatic. Nose: No congestion/rhinnorhea. Mouth/Throat: Mucous membranes are moist.  Cardiovascular: Normal rate, regular rhythm. Grossly normal heart sounds.  2+  radial pulses bilaterally. Respiratory: Normal respiratory effort.  No retractions. Lungs CTAB. Gastrointestinal: Soft and nontender. No distention. Genitourinary: Thin whitish discharge noted with no cervical motion or Nexa tenderness. Musculoskeletal: No lower extremity tenderness nor edema.  Neurologic:  Normal speech and language. No gross focal neurologic deficits are appreciated.    ED Results / Procedures / Treatments   Labs (all labs ordered are listed, but only abnormal results are displayed) Labs Reviewed  WET PREP, GENITAL - Abnormal; Notable for the following components:      Result Value   WBC, Wet Prep HPF POC >=10 (*)    All other components within normal limits  URINALYSIS, ROUTINE W REFLEX MICROSCOPIC - Abnormal; Notable for the following components:   Color, Urine YELLOW (*)    APPearance HAZY (*)    Hgb urine dipstick MODERATE (*)    Leukocytes,Ua TRACE (*)    Bacteria, UA RARE (*)    All other components within normal limits  CBC WITH DIFFERENTIAL/PLATELET - Abnormal; Notable for the following components:   RBC 5.52 (*)    MCV 76.4 (*)    [facility]  24.1 (*)    RDW 16.7 (*)    Platelets 144 (*)    All other components within normal limits  CHLAMYDIA/NGC RT PCR ([facility] ONLY)            COMPREHENSIVE METABOLIC PANEL WITH GFR    RADIOLOGY Pelvic ultrasound reviewed and interpreted by me with no free fluid or torsion.  PROCEDURES:  Critical Care performed: No  Procedures   MEDICATIONS ORDERED IN ED: Medications - No data to display   IMPRESSION / MDM / ASSESSMENT AND PLAN / ED COURSE  I reviewed the triage vital signs and the nursing notes.                              33 y.o. female with past medical history of endometriosis status post partial hysterectomy, migraines, and ovarian cyst who presents to the ED complaining of 2 weeks of increasing pelvic pain and pressure, especially when standing.  Patient's presentation is most consistent with acute  presentation with potential threat to life or bodily function.  Differential diagnosis includes, but is not limited to, cystitis, pyelonephritis, ovarian cyst, ovarian torsion, cervicitis, PID, vaginitis, vaginosis.  Patient nontoxic-appearing and in no acute distress, vital signs are unremarkable.  Abdominal exam is reassuring, pelvic exam with some thin whitish discharge but no cervical motion tenderness.  Wet prep and GC/chlamydia testing are pending, patient denies any concerns for sexually transmitted infections.  We will further assess with labs and urinalysis as well as pelvic ultrasound, patient declines pain medication for now.  Labs without significant anemia, leukocytosis, electrolyte abnormality, or AKI.  LFTs are unremarkable, urinalysis does not appear concerning for infection.  Pelvic ultrasound is unremarkable, CT renal protocol also performed and shows questionable stone at the left UVJ without associated hydronephrosis.  This could be contributing to intermittent pain and hematuria, patient has appointment scheduled with urology later this month.  She was prescribed short course of pain and nausea medication, counseled to return to the ED for new or worsening symptoms.  Patient agrees with plan.      FINAL CLINICAL IMPRESSION(S) / ED DIAGNOSES   Final diagnoses:  Pelvic pain in female  Hematuria, unspecified type     Rx / DC Orders   ED Discharge Orders          Ordered    oxyCODONE  (ROXICODONE ) 5 MG immediate release tablet  Every 8 hours PRN        05/30/24 1650    ondansetron  (ZOFRAN -ODT) 4 MG disintegrating tablet  Every 8 hours PRN        05/30/24 1650             Note:  This document was prepared using Dragon voice recognition software and may include unintentional dictation errors.   Willo Dunnings, MD 05/30/24 [phone]

## 2024-06-17 ENCOUNTER — Ambulatory Visit: Admitting: Urology

## 2024-07-02 NOTE — Progress Notes (Unsigned)
      SUBJECTIVE:  33 y.o. here for follow-up weight loss visit, previously seen 4 weeks ago. Denies any concerns and feels like medication is working well. She has lost 3 lbs this past month. She denies any negative side effect from the medication use.    OBJECTIVE:  BP 114/73   Pulse 93   Ht 5' 7 (1.702 m)   Wt 199 lb 6.4 oz (90.4 kg)   LMP 11/15/2023 (Exact Date)   BMI 31.23 kg/m   Body mass index is 31.23 kg/m. Patient appears well.  ASSESSMENT:  Obesity- responding well to weight loss plan  PLAN:  To continue with current medications. B12 1000mcg/ml injection given  RTC in 4 weeks as planned  Zelda Hummer, CNM

## 2024-07-03 ENCOUNTER — Ambulatory Visit: Admitting: Certified Nurse Midwife

## 2024-07-03 ENCOUNTER — Encounter: Payer: Self-pay | Admitting: Certified Nurse Midwife

## 2024-07-03 VITALS — BP 114/73 | HR 93 | Ht 67.0 in | Wt 199.4 lb

## 2024-07-03 DIAGNOSIS — Z7689 Persons encountering health services in other specified circumstances: Secondary | ICD-10-CM

## 2024-07-03 DIAGNOSIS — E669 Obesity, unspecified: Secondary | ICD-10-CM | POA: Diagnosis not present

## 2024-07-03 DIAGNOSIS — Z6831 Body mass index (BMI) 31.0-31.9, adult: Secondary | ICD-10-CM | POA: Diagnosis not present

## 2024-07-03 MED ORDER — CYANOCOBALAMIN 1000 MCG/ML IJ SOLN
1000.0000 ug | Freq: Once | INTRAMUSCULAR | Status: AC
Start: 2024-07-03 — End: 2024-07-03
  Administered 2024-07-03: 1000 ug via INTRAMUSCULAR

## 2024-07-03 MED ORDER — CYANOCOBALAMIN 1000 MCG/ML IJ SOLN: 1000.0000 ug | Freq: Once | INTRAMUSCULAR | 5 refills | Status: AC

## 2024-07-03 MED ORDER — PHENTERMINE HCL 37.5 MG PO TABS: 37.5000 mg | ORAL_TABLET | Freq: Every day | ORAL | 0 refills | Status: DC

## 2024-08-05 ENCOUNTER — Encounter: Payer: Self-pay | Admitting: Certified Nurse Midwife

## 2024-08-05 ENCOUNTER — Ambulatory Visit: Admitting: Certified Nurse Midwife

## 2024-08-05 VITALS — BP 101/65 | HR 103 | Ht 67.0 in | Wt 196.9 lb

## 2024-08-05 DIAGNOSIS — Z6838 Body mass index (BMI) 38.0-38.9, adult: Secondary | ICD-10-CM

## 2024-08-05 DIAGNOSIS — E669 Obesity, unspecified: Secondary | ICD-10-CM | POA: Diagnosis not present

## 2024-08-05 DIAGNOSIS — Z7689 Persons encountering health services in other specified circumstances: Secondary | ICD-10-CM

## 2024-08-05 MED ORDER — PHENTERMINE HCL 37.5 MG PO TABS: 37.5000 mg | ORAL_TABLET | Freq: Every day | ORAL | 0 refills | Status: DC

## 2024-08-05 MED ORDER — IBUPROFEN 800 MG PO TABS: 800.0000 mg | ORAL_TABLET | Freq: Three times a day (TID) | ORAL | 1 refills | Status: AC | PRN

## 2024-08-05 MED ORDER — CYANOCOBALAMIN 1000 MCG/ML IJ SOLN
1000.0000 ug | Freq: Once | INTRAMUSCULAR | Status: AC
  Administered 2024-08-05: 1000 ug via INTRAMUSCULAR

## 2024-08-05 NOTE — Addendum Note (Signed)
 Addended by: DELANA SAUER on: 08/05/2024 02:41 PM   Modules accepted: Orders

## 2024-08-05 NOTE — Patient Instructions (Signed)

## 2024-08-05 NOTE — Progress Notes (Signed)
   SUBJECTIVE:  33 y.o. here for follow-up weight loss visit, previously seen 4 weeks ago. Denies any concerns and feels like medication is working well. She lost 3 lbs this past month. She denies any negative side effect from medication use.    OBJECTIVE:  Ht 5' 7 (1.702 m)   Wt 196 lb 14.4 oz (89.3 kg)   LMP 11/15/2023 (Exact Date)   BMI 30.84 kg/m   Body mass index is 30.84 kg/m.  Waist 38.3  Patient appears well.  ASSESSMENT:  Obesity- responding well to weight loss plan  PLAN:  To continue with current medications. B12 1000mcg/ml injection given  RTC in 4 weeks as planned  Zelda Hummer, CNM

## 2024-09-02 ENCOUNTER — Telehealth: Admitting: Family Medicine

## 2024-09-02 ENCOUNTER — Encounter: Payer: Self-pay | Admitting: Certified Nurse Midwife

## 2024-09-02 DIAGNOSIS — R300 Dysuria: Secondary | ICD-10-CM

## 2024-09-02 DIAGNOSIS — N898 Other specified noninflammatory disorders of vagina: Secondary | ICD-10-CM

## 2024-09-02 NOTE — Progress Notes (Signed)
 Hannah Ball,  Because you are having pain with intercourse, we need you to follow up in person for testing- to make sure we get you on the medications you need. You might need a swab and urine sample given you have symptoms of both conditions- vaginal discharge and UTI.   We feel your condition warrants further evaluation and recommend that you be seen in a face-to-face visit.   NOTE: There will be NO CHARGE for this E-Visit   If you are having a true medical emergency, please call 911.

## 2024-09-03 ENCOUNTER — Other Ambulatory Visit: Payer: Self-pay

## 2024-09-03 MED ORDER — FLUCONAZOLE 150 MG PO TABS: 150.0000 mg | ORAL_TABLET | Freq: Once | ORAL | 0 refills | Status: AC

## 2024-09-10 NOTE — Progress Notes (Signed)
     SUBJECTIVE:  33 y.o. here for follow-up weight loss visit, previously seen 4 weeks ago. Denies any concerns and feels like medication is not really working for her. She state she does not feel like it is giving her the energy boost or appetitive suppression that it did when she first started. She is interested is using a Glp1 . States she has increased her workout frequency/intensity.   OBJECTIVE:  BP 113/69   Pulse 90   Ht 5' 7 (1.702 m)   Wt 196 lb 12.8 oz (89.3 kg)   LMP 11/15/2023 (Exact Date)   BMI 30.82 kg/m   Body mass index is 30.82 kg/m.  Waist 38.3in  Patient appears well.  ASSESSMENT:  Obesity- not responding. Pt has maintained same weight for 2 months.   PLAN:   she will contact her insurance to find out about coverage of GLP1 RTC in 4 weeks as planned  Hannah Ball, [place]

## 2024-09-11 ENCOUNTER — Encounter: Payer: Self-pay | Admitting: Certified Nurse Midwife

## 2024-09-11 ENCOUNTER — Ambulatory Visit (INDEPENDENT_AMBULATORY_CARE_PROVIDER_SITE_OTHER): Admitting: Certified Nurse Midwife

## 2024-09-11 VITALS — BP 113/69 | HR 90 | Ht 67.0 in | Wt 196.8 lb

## 2024-09-11 DIAGNOSIS — Z713 Dietary counseling and surveillance: Secondary | ICD-10-CM | POA: Diagnosis not present

## 2024-09-11 DIAGNOSIS — Z6830 Body mass index (BMI) 30.0-30.9, adult: Secondary | ICD-10-CM

## 2024-09-11 DIAGNOSIS — E669 Obesity, unspecified: Secondary | ICD-10-CM | POA: Diagnosis not present

## 2024-09-11 DIAGNOSIS — Z7689 Persons encountering health services in other specified circumstances: Secondary | ICD-10-CM

## 2024-09-11 MED ORDER — PHENTERMINE HCL 37.5 MG PO TABS: 37.5000 mg | ORAL_TABLET | Freq: Every day | ORAL | 0 refills | Status: DC

## 2024-09-11 MED ORDER — CYANOCOBALAMIN 1000 MCG/ML IJ SOLN
1000.0000 ug | Freq: Once | INTRAMUSCULAR | Status: AC
  Administered 2024-09-11: 1000 ug via INTRAMUSCULAR

## 2024-09-30 ENCOUNTER — Ambulatory Visit: Admitting: Certified Nurse Midwife

## 2024-09-30 ENCOUNTER — Encounter: Payer: Self-pay | Admitting: Certified Nurse Midwife

## 2024-09-30 VITALS — BP 104/71 | HR 76 | Ht 67.0 in | Wt 194.5 lb

## 2024-09-30 DIAGNOSIS — Z713 Dietary counseling and surveillance: Secondary | ICD-10-CM

## 2024-09-30 DIAGNOSIS — E669 Obesity, unspecified: Secondary | ICD-10-CM

## 2024-09-30 DIAGNOSIS — Z6830 Body mass index (BMI) 30.0-30.9, adult: Secondary | ICD-10-CM

## 2024-09-30 DIAGNOSIS — Z7689 Persons encountering health services in other specified circumstances: Secondary | ICD-10-CM

## 2024-09-30 MED ORDER — CYANOCOBALAMIN 1000 MCG/ML IJ SOLN
1000.0000 ug | Freq: Once | INTRAMUSCULAR | Status: AC
  Administered 2024-09-30: 1000 ug via INTRAMUSCULAR

## 2024-09-30 MED ORDER — PHENTERMINE HCL 37.5 MG PO TABS: 37.5000 mg | ORAL_TABLET | Freq: Every day | ORAL | 0 refills | Status: DC

## 2024-09-30 NOTE — Addendum Note (Signed)
 Addended by: DELANA SAUER on: 09/30/2024 10:21 AM   Modules accepted: Orders

## 2024-09-30 NOTE — Patient Instructions (Signed)

## 2024-09-30 NOTE — Progress Notes (Signed)
      SUBJECTIVE:  33 y.o. here for follow-up weight loss visit, previously seen 4 weeks ago. Denies any concerns and feels like medication is working , she has lost 2 lbs this past month. She is surprised by the weight loss. State she did have Thanksgiving dinner.    OBJECTIVE:  BP 104/71   Pulse 76   Ht 5' 7 (1.702 m)   Wt 194 lb 8 oz (88.2 kg)   LMP 11/15/2023 (Exact Date)   BMI 30.46 kg/m   Body mass index is 30.46 kg/m.  Waist : 38 in  Patient appears well.  ASSESSMENT:  Obesity- responding well to weight loss plan  PLAN:  To continue with current medications. B12 1000mcg/ml injection given  RTC in 4 weeks as planned  Zelda Hummer , CNM

## 2024-10-28 ENCOUNTER — Encounter: Payer: Self-pay | Admitting: Certified Nurse Midwife

## 2024-10-28 ENCOUNTER — Ambulatory Visit (INDEPENDENT_AMBULATORY_CARE_PROVIDER_SITE_OTHER): Admitting: Certified Nurse Midwife

## 2024-10-28 VITALS — BP 108/65 | HR 92 | Ht 67.0 in | Wt 200.8 lb

## 2024-10-28 DIAGNOSIS — Z713 Dietary counseling and surveillance: Secondary | ICD-10-CM | POA: Diagnosis not present

## 2024-10-28 DIAGNOSIS — Z6831 Body mass index (BMI) 31.0-31.9, adult: Secondary | ICD-10-CM

## 2024-10-28 DIAGNOSIS — E669 Obesity, unspecified: Secondary | ICD-10-CM | POA: Diagnosis not present

## 2024-10-28 DIAGNOSIS — Z7689 Persons encountering health services in other specified circumstances: Secondary | ICD-10-CM

## 2024-10-28 MED ORDER — PHENTERMINE HCL 37.5 MG PO TABS: 37.5000 mg | ORAL_TABLET | Freq: Every day | ORAL | 0 refills | Status: DC

## 2024-10-28 MED ORDER — CYANOCOBALAMIN 1000 MCG/ML IJ SOLN
1000.0000 ug | Freq: Once | INTRAMUSCULAR | Status: AC
  Administered 2024-10-28: 1000 ug via INTRAMUSCULAR

## 2024-10-28 NOTE — Progress Notes (Signed)
"  ° °  SUBJECTIVE:  33 y.o. here for follow-up weight loss visit, previously seen 4 weeks ago. Denies any concerns and feels like medication is working however, she gained 6 lbs this past months.  She states with the holidays and family illness this past month was challenging.   OBJECTIVE:  BP 108/65   Pulse 92   Ht 5' 7 (1.702 m)   Wt 200 lb 12.8 oz (91.1 kg)   LMP 11/15/2023   BMI 31.45 kg/m   Body mass index is 31.45 kg/m.  Waist: 40 in.   Patient appears well.  ASSESSMENT:  Obesity-reassurance given pt encouraged to be consistent with diet and exercise changes now that holidays is over. She is in agreement.  PLAN:  To continue with current medications. B12 1000mcg/ml injection given  RTC in 4 weeks as planned  Zelda Hummer , CNM "

## 2024-11-26 NOTE — Progress Notes (Unsigned)
"  ° ° °  SUBJECTIVE:  34 y.o. here for follow-up weight loss visit, previously seen 4 weeks ago. Denies any concerns and feels like medication is working , she notes she had difficult month as family member passed away. She was eating cafeteria food and eating out but was still able to lose 2 lbs.   OBJECTIVE:  BP 91/76   Pulse (!) 104   Ht 5' 7 (1.702 m)   Wt 198 lb 14.4 oz (90.2 kg)   LMP 11/15/2023   BMI 31.15 kg/m   Body mass index is 31.15 kg/m. Patient appears well. Repeat pulse 98  ASSESSMENT:  Obesity- responding well to weight loss plan  PLAN:  Discussed that she has been taking medication since May , recommendation short term use 3-6 months , up to 1 year with good results. Reviewed potential risks with continued use. Discussed taking a break for 6 months with continued efforts to lose weight and possible restart if she has not yet reached her goal. She would like to continue this month. Will consider our conversation. To continue with current medications. B12 1000mcg/ml injection given   RTC in 4 weeks as planned  Zelda Hummer, CNM "

## 2024-11-27 ENCOUNTER — Encounter: Payer: Self-pay | Admitting: Certified Nurse Midwife

## 2024-11-27 ENCOUNTER — Ambulatory Visit: Admitting: Certified Nurse Midwife

## 2024-11-27 VITALS — BP 91/76 | HR 104 | Ht 67.0 in | Wt 198.9 lb

## 2024-11-27 DIAGNOSIS — E669 Obesity, unspecified: Secondary | ICD-10-CM

## 2024-11-27 DIAGNOSIS — Z7689 Persons encountering health services in other specified circumstances: Secondary | ICD-10-CM

## 2024-11-27 MED ORDER — PHENTERMINE HCL 37.5 MG PO TABS: 37.5000 mg | ORAL_TABLET | Freq: Every day | ORAL | 0 refills | Status: AC

## 2024-11-27 MED ORDER — CYANOCOBALAMIN 1000 MCG/ML IJ SOLN
1000.0000 ug | Freq: Once | INTRAMUSCULAR | Status: AC
  Administered 2024-11-27: 1000 ug via INTRAMUSCULAR

## 2024-11-27 NOTE — Addendum Note (Signed)
 Addended by: DELANA SAUER on: 11/27/2024 10:06 AM   Modules accepted: Orders

## 2024-11-27 NOTE — Patient Instructions (Signed)

## 2024-12-23 ENCOUNTER — Ambulatory Visit: Admitting: Certified Nurse Midwife
